# Patient Record
Sex: Male | Born: 1935
Health system: Southern US, Community
[De-identification: ages and names within clinical notes are randomized; demographics above are authoritative.]

## PROBLEM LIST (undated history)

## (undated) DIAGNOSIS — I469 Cardiac arrest, cause unspecified: Secondary | ICD-10-CM

## (undated) DIAGNOSIS — E785 Hyperlipidemia, unspecified: Secondary | ICD-10-CM

## (undated) DIAGNOSIS — I10 Essential (primary) hypertension: Secondary | ICD-10-CM

## (undated) DIAGNOSIS — I2510 Atherosclerotic heart disease of native coronary artery without angina pectoris: Secondary | ICD-10-CM

## (undated) DIAGNOSIS — I472 Ventricular tachycardia: Secondary | ICD-10-CM

## (undated) HISTORY — DX: Ventricular tachycardia: I47.2

## (undated) HISTORY — PX: TONSILLECTOMY AND ADENOIDECTOMY: SUR1326

---

## 1998-02-25 ENCOUNTER — Inpatient Hospital Stay (HOSPITAL_COMMUNITY): Admission: EM | Admit: 1998-02-25 | Discharge: 1998-02-28 | Payer: Self-pay | Admitting: *Deleted

## 1998-02-27 ENCOUNTER — Encounter: Payer: Self-pay | Admitting: Cardiovascular Disease

## 2000-10-03 ENCOUNTER — Encounter (INDEPENDENT_AMBULATORY_CARE_PROVIDER_SITE_OTHER): Payer: Self-pay | Admitting: *Deleted

## 2000-10-03 ENCOUNTER — Ambulatory Visit (HOSPITAL_COMMUNITY): Admission: RE | Admit: 2000-10-03 | Discharge: 2000-10-03 | Payer: Self-pay | Admitting: *Deleted

## 2001-03-12 DIAGNOSIS — I469 Cardiac arrest, cause unspecified: Secondary | ICD-10-CM

## 2001-03-12 HISTORY — DX: Cardiac arrest, cause unspecified: I46.9

## 2001-12-15 ENCOUNTER — Inpatient Hospital Stay (HOSPITAL_COMMUNITY): Admission: EM | Admit: 2001-12-15 | Discharge: 2001-12-20 | Payer: Self-pay | Admitting: Emergency Medicine

## 2001-12-15 ENCOUNTER — Encounter: Payer: Self-pay | Admitting: Emergency Medicine

## 2001-12-15 HISTORY — PX: CARDIAC CATHETERIZATION: SHX172

## 2001-12-16 ENCOUNTER — Encounter: Payer: Self-pay | Admitting: Thoracic Surgery (Cardiothoracic Vascular Surgery)

## 2001-12-17 ENCOUNTER — Encounter: Payer: Self-pay | Admitting: Thoracic Surgery (Cardiothoracic Vascular Surgery)

## 2001-12-18 ENCOUNTER — Encounter: Payer: Self-pay | Admitting: Thoracic Surgery (Cardiothoracic Vascular Surgery)

## 2002-01-12 ENCOUNTER — Encounter (HOSPITAL_COMMUNITY): Admission: RE | Admit: 2002-01-12 | Discharge: 2002-03-31 | Payer: Self-pay | Admitting: Cardiology

## 2002-01-14 ENCOUNTER — Encounter
Admission: RE | Admit: 2002-01-14 | Discharge: 2002-01-14 | Payer: Self-pay | Admitting: Thoracic Surgery (Cardiothoracic Vascular Surgery)

## 2002-01-14 ENCOUNTER — Encounter: Payer: Self-pay | Admitting: Thoracic Surgery (Cardiothoracic Vascular Surgery)

## 2002-03-12 HISTORY — PX: CORONARY ARTERY BYPASS GRAFT: SHX141

## 2003-07-26 ENCOUNTER — Ambulatory Visit (HOSPITAL_COMMUNITY): Admission: RE | Admit: 2003-07-26 | Discharge: 2003-07-26 | Payer: Self-pay | Admitting: Cardiology

## 2005-09-26 ENCOUNTER — Ambulatory Visit (HOSPITAL_COMMUNITY): Admission: RE | Admit: 2005-09-26 | Discharge: 2005-09-26 | Payer: Self-pay | Admitting: *Deleted

## 2005-09-26 ENCOUNTER — Encounter (INDEPENDENT_AMBULATORY_CARE_PROVIDER_SITE_OTHER): Payer: Self-pay | Admitting: *Deleted

## 2005-11-18 ENCOUNTER — Emergency Department (HOSPITAL_COMMUNITY): Admission: EM | Admit: 2005-11-18 | Discharge: 2005-11-18 | Payer: Self-pay | Admitting: Emergency Medicine

## 2006-01-23 ENCOUNTER — Inpatient Hospital Stay (HOSPITAL_COMMUNITY): Admission: EM | Admit: 2006-01-23 | Discharge: 2006-01-24 | Payer: Self-pay | Admitting: Emergency Medicine

## 2007-10-28 ENCOUNTER — Ambulatory Visit (HOSPITAL_COMMUNITY): Admission: RE | Admit: 2007-10-28 | Discharge: 2007-10-28 | Payer: Self-pay | Admitting: *Deleted

## 2007-10-28 ENCOUNTER — Encounter (INDEPENDENT_AMBULATORY_CARE_PROVIDER_SITE_OTHER): Payer: Self-pay | Admitting: *Deleted

## 2009-07-07 ENCOUNTER — Inpatient Hospital Stay (HOSPITAL_COMMUNITY): Admission: EM | Admit: 2009-07-07 | Discharge: 2009-07-08 | Payer: Self-pay | Admitting: Emergency Medicine

## 2009-07-08 ENCOUNTER — Encounter (INDEPENDENT_AMBULATORY_CARE_PROVIDER_SITE_OTHER): Payer: Self-pay | Admitting: Internal Medicine

## 2009-12-09 HISTORY — PX: NM MYOCAR PERF WALL MOTION: HXRAD629

## 2010-05-30 LAB — BASIC METABOLIC PANEL
BUN: 16 mg/dL (ref 6–23)
CO2: 27 mEq/L (ref 19–32)
CO2: 28 mEq/L (ref 19–32)
Calcium: 8.3 mg/dL — ABNORMAL LOW (ref 8.4–10.5)
Calcium: 8.6 mg/dL (ref 8.4–10.5)
Chloride: 103 mEq/L (ref 96–112)
Chloride: 106 mEq/L (ref 96–112)
Creatinine, Ser: 0.79 mg/dL (ref 0.4–1.5)
Creatinine, Ser: 0.8 mg/dL (ref 0.4–1.5)
GFR calc non Af Amer: >60 mL/min (ref >60)
Glucose, Bld: 92 mg/dL (ref 70–99)
Potassium: 3.6 mEq/L (ref 3.5–5.1)
Potassium: 4.3 mEq/L (ref 3.5–5.1)
Sodium: 138 mEq/L (ref 135–145)

## 2010-05-30 LAB — POCT CARDIAC MARKERS
CKMB, poc: 1.5 ng/mL (ref 1.0–8.0)
Myoglobin, poc: 53.3 ng/mL (ref 12–200)
Myoglobin, poc: 69 ng/mL (ref 12–200)
Troponin i, poc: <0.05 ng/mL (ref 0.00–0.09)
Troponin i, poc: <0.05 ng/mL (ref 0.00–0.09)

## 2010-05-30 LAB — CARDIAC PANEL(CRET KIN+CKTOT+MB+TROPI)
Relative Index: 2.2 (ref 0.0–2.5)
Total CK: 110 U/L (ref 7–232)
Troponin I: 0.01 ng/mL (ref 0.00–0.06)
Troponin I: <0.01 ng/mL (ref 0.00–0.06)

## 2010-05-30 LAB — BRAIN NATRIURETIC PEPTIDE: Pro B Natriuretic peptide (BNP): 32 pg/mL (ref 0.0–100.0)

## 2010-05-30 LAB — DIFFERENTIAL
Eosinophils Relative: 2 % (ref 0–5)
Neutrophils Relative %: 56 % (ref 43–77)

## 2010-05-30 LAB — HEMOGLOBIN A1C
Hgb A1c MFr Bld: 5.8 % — ABNORMAL HIGH (ref <5.7)
Mean Plasma Glucose: 120 mg/dL — ABNORMAL HIGH (ref <117)

## 2010-05-30 LAB — CBC
Hemoglobin: 14.6 g/dL (ref 13.0–17.0)
MCHC: 34.8 g/dL (ref 30.0–36.0)
MCV: 93.3 fL (ref 78.0–100.0)
Platelets: 184 10*3/uL (ref 150–400)
Platelets: 190 10*3/uL (ref 150–400)
RBC: 4.5 MIL/uL (ref 4.22–5.81)
RDW: 13.9 % (ref 11.5–15.5)
RDW: 14 % (ref 11.5–15.5)

## 2010-05-30 LAB — LIPID PANEL
Cholesterol: 112 mg/dL (ref 0–200)
HDL: 38 mg/dL — ABNORMAL LOW (ref >39)
LDL Cholesterol: 55 mg/dL (ref 0–99)
Total CHOL/HDL Ratio: 2.9 RATIO

## 2010-05-30 LAB — GLUCOSE, CAPILLARY
Glucose-Capillary: 112 mg/dL — ABNORMAL HIGH (ref 70–99)
Glucose-Capillary: 83 mg/dL (ref 70–99)

## 2010-07-25 NOTE — Op Note (Signed)
NAME:  Michael Bryant, Michael Bryant                 ACCOUNT NO.:  [id number]   MEDICAL RECORD NO.:  [id number]          PATIENT TYPE:  AMB   LOCATION:  ENDO                         FACILITY:  [facility]   PHYSICIAN:  Georgiana Spinner, M.D.    DATE OF BIRTH:  1933/06/12   DATE OF PROCEDURE:  DATE OF DISCHARGE:                               OPERATIVE REPORT   PROCEDURE:  Upper endoscopy.   INDICATIONS:  GERD.   ANESTHESIA:  Fentanyl 50 mcg, Versed 5 mg.   DESCRIPTION OF PROCEDURE:  With the patient mildly sedated in the left  lateral decubitus position, the Pentax videoscopic endoscope was  inserted in the mouth and passed under direct vision through the  esophagus which appeared normal until we reached the distal esophagus  and there was a small area of Barrett's photographed and biopsied.  We  entered into the stomach.  The fundus, body, antrum, duodenal bulb and  second portion duodenum were visualized.  From this point, the endoscope  was slowly withdrawn, taking circumferential views of duodenal mucosa  until the endoscope had been pulled back into the stomach and placed in  retroflexion to view the stomach from below.  The endoscope was then  straightened and withdrawn, taking circumferential views of the  remaining gastric and esophageal mucosa, photographed, but possible  Barrett's which was biopsied, as well as photographed.  The endoscope  was withdrawn.  The patient's vital signs and pulse oximeter remained  stable.  The patient tolerated the procedure well without apparent  complication.   FINDINGS:  Two areas of possible Barrett's esophagus.  Await biopsy  report.  The patient will call me for results and follow-up with me as  an outpatient.           ______________________________  Georgiana Spinner, M.D.     GMO/MEDQ  D:  10/28/2007  T:  10/28/2007  Job:  [id number]

## 2010-07-28 NOTE — Discharge Summary (Signed)
NAME:  Michael Bryant, Michael Bryant                           ACCOUNT NO.:  [id number]   MEDICAL RECORD NO.:  [id number]                   PATIENT TYPE:  INP   LOCATION:  2029                                 FACILITY:  [facility]   PHYSICIAN:  Salvatore Decent. Dorris Fetch, M.Bryant.         DATE OF BIRTH:  Oct 06, 1933   DATE OF ADMISSION:  12/15/2001  DATE OF DISCHARGE:  12/20/2001                                 DISCHARGE SUMMARY   ADMISSION DIAGNOSIS:  Chest pain.   SECONDARY DIAGNOSES:  1. Ventricular fibrillation, cardiac arrest.  2. Postoperative anemia secondary to blood loss.  3. Hypercholesterolemia.   DISCHARGE DIAGNOSIS:  Coronary artery disease.   HOSPITAL COURSE:  The patient was admitted to [facility] on  12/15/01, after experiencing sudden onset of substernal chest pain.  He was  seen and evaluated by cardiologist who took the patient for an emergent  cardiac catheterization.  During this time, the patient had a ventricular  fibrillation causing cardiac arrest x2.  The patient underwent cardiac  catheterization and was stabilized.  Dr. _______________ was consulted  emergently and subsequently took the patient to the operating room on the  night of admission for a coronary artery bypass grafting x3 with left  internal mammary artery anastomosed to the left anterior descending artery,  saphenous vein graft to the obtuse marginal artery, and a saphenous vein  graft to the distal right coronary artery.  No complications were noted  during the intraoperative course.  The patient had an uneventful hospital  course.  He had some mild postoperative anemia secondary to blood loss which  he tolerated well.  No transfusion was required.  His hemoglobin and  hematocrit stabilized at 9.5 and 28.1.  His BUN and creatinine were 18 and  0.9.  Potassium was 4.0.  The patient was subsequently deemed stable for  discharge home on 12/20/01, which is postoperative day #5.   DISCHARGE MEDICATIONS:  1.  Aspirin 325 mg one q.Bryant.  2. _______________80 mg q.Bryant.  3. Lopressor 50 mg 1/2 tablet b.i.Bryant.  4. Mavik 1 mg q.Bryant.  5. Tylox one or two tablets p.o. q.4-6h. p.r.n. pain.   ACTIVITY:  The patient is told no driving, strenuous activity, or lifting  heavy objects.  He is told to walk daily and to continue using his incentive  spirometer.   DIET:  Low fat, low salt.   WOUND CARE:  The patient was told he can shower and clean his incisions with  soap and water.   DISPOSITION:  Home.    FOLLOWUP:  1. The patient was told to see Dr. Dorris Fetch at the [facility] office on     Wednesday, 01/14/02, at 10 a.m.  He was told to go to the [facility] one hour before this appointment to get a chest x-ray.  2. He was told to call his cardiologist, Dr. Julieanne Manson for a two week  follow up appointment.         Levin Erp. Steward, P.A.                      Salvatore Decent Dorris Fetch, M.Bryant.    BGS/MEDQ  Bryant:  12/19/2001  T:  12/22/2001  Job:  [id number]   cc:   Thereasa Solo. Little, M.Bryant.   [facility] office

## 2010-07-28 NOTE — Cardiovascular Report (Signed)
NAME:  Michael Bryant, Michael Bryant, Michael Bryant                       ACCOUNT NO.:  [id number]   MEDICAL RECORD NO.:  [id number]                   PATIENT TYPE:  INP   LOCATION:  2029                                 FACILITY:  [facility]   PHYSICIAN:  Thereasa Solo. Little, M.Bryant.              DATE OF BIRTH:  1933-11-26   DATE OF PROCEDURE:  12/15/2001  DATE OF DISCHARGE:                              CARDIAC CATHETERIZATION   INDICATIONS FOR PROCEDURE:  The patient is a 77 year old male who developed  chest pain at 6 o'clock in the evening, who presented to the emergency room  around 7 o'clock and at 7:15 fibrillated and was defibrillated.  His ECG  showed diffuse ST segment depression in the inferolateral leads and he was  brought to the catheterization lab for emergency cardiac catheterization.   DESCRIPTION OF PROCEDURE:  Following local anesthetic with 1% Xylocaine, the  Seldinger technique was employed and a 7 [place] introducer sheath was placed  in his right femoral artery. Left and right coronary arteriography and  ventriculography in the RAO projection was performed.   COMPLICATIONS:  None.   EQUIPMENT:  The 6 French Judkins configuration catheters.   RESULTS:  1. Hemodynamic monitoring:  Central aortic pressure 119/90, left ventricular     pressure 124/17 with no aortic valve gradient noted at time of pullback.  2. Ventriculography:  Ventriculography in the RAO projection revealed the     anterior wall, the apex and the distal inferior wall to be akinetic with     an ejection fraction around 45%.  No mitral regurgitation was seen.  The     end-diastolic pressure was 29 (it appears that the anterior wall is     stunned).   CORONARY ARTERIOGRAPHY:  1. Left main:  Normal.  2. LAD:  The very proximal portion of the LAD has a 60% plus eccentric area     of narrowing with an active mobile thrombosis. There is 50% sequential     areas in the midportion and the distal vessel appears to be free of     disease  and graftable. The proximal lesion is too close to the left main     for safe percutaneous intervention.  3. Optimal diagonal:  This is a small vessel with 50-60% proximal mid     narrowing.  4. Circumflex:  The circumflex gave rise to one OM vessel with a mid 60%     area of narrowing.  5. Right coronary artery:  The right coronary artery is a large dominant     vessel with the PDA and three PL branches.  There is a proximal 40% area     of narrowing.   CONCLUSION:  Proximal 60% plus stenosis in the left anterior descending with  active thrombosis too close to the left main for intervention.   DISPOSITION:  The patient is on IV heparin.  Dr. Dorris Fetch has seen the  patient and plans to take him for emergency bypass surgery.                                                 Thereasa Solo. Little, M.Bryant.    ABL/MEDQ  Bryant:  12/15/2001  T:  12/18/2001  Job:  [id number]   cc:   Salvatore Decent. Dorris Fetch, M.Bryant.  [address]  [place]  [name] [address]  Fax: [phone]   Aram Candela. Aleen Campi, M.Bryant.  [address]  [place]  [name] [address]  Fax: [phone]   Janae Bridgeman. Eloise Harman., M.Bryant.  [address]  [place]  [name] [address]  Fax: [phone]   Cardiac Catheterization Laboratory

## 2010-07-28 NOTE — H&P (Signed)
NAME:  Michael Bryant, Michael Bryant                 ACCOUNT NO.:  [id number]   MEDICAL RECORD NO.:  [id number]          PATIENT TYPE:  INP   LOCATION:  1824                         FACILITY:  [facility]   PHYSICIAN:  Lonia Blood, M.D.DATE OF BIRTH:  Aug 05, 1933   DATE OF ADMISSION:  01/23/2006  DATE OF DISCHARGE:                              HISTORY & PHYSICAL   PRIMARY CARE PHYSICIAN:  Dr. Higinio Plan.   CARDIOLOGIST:  Dr. Antionette Char, MD   CHIEF COMPLAINT:  Chest pain with diaphoresis.   HISTORY OF PRESENT ILLNESS:  [name] is a very pleasant 77-year-  old gentleman with a known history of coronary artery disease, who is  status post coronary artery bypass graft of 3 vessels in October 2003.  He also has known hypercholesterolemia.  He is a nonsmoker and does not  have diabetes.  He has well-controlled hypertension.  Patient was in his  usual state of health this morning.  He went to a local golf course to  enjoy a round of golf.  At hole 17, he suffered the acute onset of  substernal pressure-type sensation.  This sensation did not radiate.  It  occurred while he was walking to the green.  It was associated with a  particularly bad shot.  Symptoms persisted.  A few minutes after onset,  it began to be associated with diaphoresis and a feeling of  lightheadedness.  The patient continued his round of golf and in fact  parred the final hole.  He then promptly ambulated to his car, where he  took a sublingual nitroglycerin.  The sensation of pressure in his chest  promptly disappeared after the use of the sublingual nitroglycerin.  Even after the disappearance of this pressure-type sensation, however,  the patient reported that he continued to feel just not quite right.  He went home and described the symptoms to his wife, and they both  decided that presentation at the ER would be most appropriate.  In the  emergency room, two sets of cardiac enzymes have returned, and they have  been negative so far.  His EKG does not reveal any significant acute  change when compared to an EKG from September of this year.  The patient  no longer has a chest discomfort but continues to complain that he just  does not quite feel right in the chest.   REVIEW OF SYSTEMS:  With the exception to the positive elements noted in  the history of present illness above, a comprehensive review of systems  is unremarkable.   PAST MEDICAL HISTORY:  1. Known coronary artery disease.      a.     Status post acute coronary syndrome with v-fib arrest       requiring defibrillation October 2003.      b.     Status post emergent cardiac catheterization October 2003       revealing high-grade proximal LAD thrombus.      c.     Resultant emergent coronary artery bypass graft October       2003, requiring 3-vessel  bypass.      d.     Patient reports two stress tests since that time but no       cardiac cath.  2. Hypercholesterolemia.  3. EGD July 2002 and July 2007 revealing chronic gastritis and      Barrett's esophagus with normal biopsies and negative H. Pylori.  4. Colonoscopy July 2002 revealing internal hemorrhoids and rare      diverticulum of the sigmoid colon.  5. Benign prostatic hypertrophy.'   OUTPATIENT MEDICATIONS:  1. Mavik 1 mg q. day.  2. Lopressor 25 mg b.i.d.  3. Aspirin 325 mg q. day.   ALLERGIES:  CODEINE.   FAMILY HISTORY:  The patient has two brothers who have been diagnosed  with prostate cancer.   SOCIAL HISTORY:  The patient is retired from concrete work.  He does not  smoke, he does not drink, he does not use illicit drugs.  He is married.  He has two healthy children.   DATA REVIEWED:  CBC is normal.  BMET is unremarkable.  LFTs are  unremarkable, pH and PCO2 were unremarkable.  Point of care markers are  negative x2.  A 12-lead EKG revealed a sinus bradycardia with 55 beats  per minute but no change since September 2007.   PHYSICAL EXAMINATION:  VITAL  SIGNS:  Temperature 97.6, blood pressure  146/78, heart rate 53, respiratory rate 18, O2 sat is 95% on room air.  GENERAL:  Well-developed, well-nourished male in no acute respiratory  distress.  HEENT:  Normocephalic, atraumatic.  Pupils equal, round, reactive to  light and accommodation.  Extraocular muscles intact.  __________  clear.  NECK:  No JVD, no lymphadenopathy.  LUNGS:  Clear to auscultation bilaterally without wheezes or rhonchi.  CARDIOVASCULAR:  Regular rate and rhythm without murmur, gallop or rub.  Normal S1, S2.  ABDOMEN:  Nontender, nondistended, soft, bowel sounds present with no  hepatosplenomegaly.  No rebound or ascites.  EXTREMITIES:  There is no significant cyanosis, clubbing, edema  bilateral lower extremities.  NEUROLOGIC:  Nonfocal neurologic exam.   IMPRESSION/PLAN:  1. Chest pain - Patient described symptoms which are concerning for      angina.  He also is at exceedingly high risk for GI source for      chest discomfort, to include his gastritis and his Barrett's      esophagus.  In the setting of known coronary artery disease      requiring previous coronary artery bypass graft, however, it is not      safe to assume that this is a GI issue.  I will place the patient      in the hospital and rule out for acute myocardial infarction.      Serial EKGs and cardiac enzymes will attained.  Patient will be      monitored on telemetry.  I will proceed with treatment to include      nitroglycerin, Lovenox, aspirin, beta blocker and Ace inhibitor.      Oxygen will also be administered.  In the morning, we will contact      his cardiologist, to consider risk stratification.  It would appear      that the patient would be most appropriate for a cardiac cath.  2. Hypercholesterolemia - Patient reports he has a history of      hypercholesterolemia but does not appear to be any lipid-lowering     medications.  I will check a lipid panel, and we will proceed  as       appropriate.  3. Known gastritis and Barrett's esophagus - The patient, we placed      empirically on Protonix at 40 mg p.o. b.i.d. and Mylanta.  If his      cardiac evaluation proves to be unremarkable, we may need to      consider continuing his medications for a GI source of his chest      pain.      Lonia Blood, M.D.  Electronically Signed     JTM/MEDQ  D:  01/23/2006  T:  01/23/2006  Job:  [id number]   cc:   Antionette Char, MD  Janae Bridgeman Eloise Harman., M.D.

## 2010-07-28 NOTE — Procedures (Signed)
[place]  Patient:    Michael Bryant, Michael Bryant                          MRN: [id number] Proc. Date: 10/03/00 Attending:  Sabino Gasser, M.D.                           Procedure Report  PROCEDURE PERFORMED:  Upper endoscopy.  ENDOSCOPIST:  Sabino Gasser, M.D.  INDICATIONS FOR PROCEDURE:  Blood in stool.  ANESTHESIA:  Demerol 30 mg, Versed 6 mg.  DESCRIPTION OF PROCEDURE:  With the patient mildly sedated in the left lateral decubitus position, the Olympus video endoscope was inserted in the mouth and passed under direct vision through the esophagus.  Question of short segment Barretts esophagus photographed.  We biopsied this area, entered into the stomach.  The fundus, body and antrum were well visualized as was the duodenal bulb and second portion of the duodenum.  Photographs were taken.  From this point, the endoscope was slowly withdrawn taking circumferential views of the entire duodenal mucosa until the endoscope had been pulled back into the stomach and placed on retroflexion to view the stomach from below and this to was photographed and showed a hiatal hernia as evidenced by incomplete wrap of the gastroesophageal junction around the endoscope.  The endoscope was then straightened and withdrawn taking circumferential views of the remaining gastric and esophageal mucosa, stopping in the stomach to biopsy gastric erythema.  Patients vital signs and pulse oximeter remained stable.  The patient tolerated the procedure well without apparent complications.  FINDINGS:  Question of short segment Barretts esophagus, biopsied.  Question of gastritis in the fundus biopsied.  Await biopsy report.  Patient will call me for results and follow up with me as an outpatient.  Proceed to colonoscopy as planned.  PLAN: DD:  10/03/00 TD:  10/03/00 Job: [id number]

## 2010-07-28 NOTE — Discharge Summary (Signed)
NAME:  Michael Bryant, Michael Bryant                 ACCOUNT NO.:  [id number]   MEDICAL RECORD NO.:  [id number]          PATIENT TYPE:  INP   LOCATION:  5524                         FACILITY:  [facility]   PHYSICIAN:  Mobolaji B. Bakare, M.D.DATE OF BIRTH:  [birth year]   DATE OF ADMISSION:  01/23/2006  DATE OF DISCHARGE:  01/24/2006                                 DISCHARGE SUMMARY   PRIMARY CARE PHYSICIAN:  Marcy Salvo C. Lendell Caprice, M.D.   CARDIOLOGIST:  Antionette Char, MD   FINAL DIAGNOSES:  1. Chest pain, myocardial infarction ruled out.  2. Known history of coronary artery disease.  3. Hypercholesterolemia.  4. Benign prostatic hypertrophy.   PROCEDURE:  Chest x-ray showed no acute disease.   CONSULTATIONS:  Cardiology consult, Antionette Char, MD.   BRIEF HISTORY:  [name] is a pleasant 77 year old gentleman who  presented with chest pain and diaphoresis while playing golf on the day of  admission.  He also felt lightheaded. The patient got some relief from the  sublingual nitroglycerin that he took before he came to the emergency  department.  EKG was unremarkable for any acute ischemic changes.  He was  then admitted for further evaluation and to rule out myocardial infarction.   HOSPITAL COURSE:  His chest pain resolved while in the hospital.  The  patient was admitted to telemetry.  There was no malignant reading.  He had  three sets of cardiac markers which were negative and subsequently two sets  of cardiac panels which were also negative.  This were the cardiac markers  at the emergency room.  The patient has since been chest pain free.  He was  evaluated by Dr. Aleen Campi, and he felt that this was atypical chest pain and  that there was no evidence of ischemia; nevertheless, the patient will be  scheduled for an outpatient stress test early next week.   Hyperlipidemia.  The patient's fasting lipid profile was assessed and he was  noted to have an LDL of 122 and HDL of 37.  The  patient is currently on fish  oil.  I will defer followup of this to Dr. Lendell Caprice.   DISCHARGE MEDICATIONS:  1. Lopressor 25 mg p.o. daily.  2. Voltaren 1 tablet p.o. daily __________  3. Nasal Rhinocort.  4. Fish oil 1 tablet p.o. daily.  5. Flax oil 1 tablet daily.  6. Aspirin 81 mg daily.  7. Red yeast rice.  8. Nitrostat p.r.n.  9. Saw palmetto 1 p.o. daily.   DISCHARGE LABORATORY DATA:  White count 5.0, hemoglobin 13.4, hematocrit  38.6, platelets 208, magnesium 2.1.  Liver function test normal.  Fasting  lipid profile:  Total cholesterol 147, triglyceride 90, HDL 37, LDL 122.   RECOMMENDATIONS:  Followup with stress test with Dr. Aleen Campi early next  week.      Mobolaji B. Corky Downs, M.D.  Electronically Signed     MBB/MEDQ  D:  01/24/2006  T:  01/25/2006  Job:  [id number]   cc:   Antionette Char, MD  Janae Bridgeman Eloise Harman., M.D.

## 2010-07-28 NOTE — Op Note (Signed)
[place]  Patient:    Michael Bryant, Michael Bryant                          MRN: [id number] Proc. Date: 10/03/00 Attending:  Sabino Gasser, M.D.                           Operative Report  PROCEDURE PERFORMED:  Colonoscopy.  ENDOSCOPIST:  Sabino Gasser, M.D.  INDICATIONS FOR PROCEDURE:  Hemoccult positivity.  ANESTHESIA:  Demerol 20 mg, Versed 2 mg.  DESCRIPTION OF PROCEDURE:  With the patient mildly sedated in the left lateral decubitus position, the Olympus videoscopic colonoscope was inserted in the rectum after normal rectal examination and passed under direct vision into the cecum.  The cecum was identified by the ileocecal valve and appendiceal orifice, both of which were photographed.  From this point, the colonoscope was slowly withdrawn, and we entered into the terminal ileum, which also appeared normal and this was photographed.  The endoscope was then further withdrawn taking circumferential views of the remaining colonic mucosa stopping only to photograph rare diverticulum seen in the sigmoid colon until we reached the rectum, which appeared normal on direct view and showed hemorrhoidal tissue on retroflex view.  The endoscope was straightened and withdrawn.  Patients vital signs and pulse oximeter remained stable.  The patient tolerated the procedure well and without apparent complications.  FINDINGS:  Internal hemorrhoids, rare diverticulum of sigmoid colon, otherwise unremarkable examination.  PLAN:  See endoscopy note for further details. DD:  10/03/00 TD:  10/03/00 Job: [id number]

## 2010-07-28 NOTE — Op Note (Signed)
NAME:  Los, Raymondo D                           ACCOUNT NO.:  [id number]   MEDICAL RECORD NO.:  [id number]                   PATIENT TYPE:  INP   LOCATION:  1824                                 FACILITY:  [facility]   PHYSICIAN:  Salvatore Decent. Dorris Fetch, M.D.         DATE OF BIRTH:  August 18, 1933   DATE OF PROCEDURE:  12/16/2001  DATE OF DISCHARGE:                                 OPERATIVE REPORT   PREOPERATIVE DIAGNOSIS:  Three vessels disease with intraluminal thrombus,  status post ventricular fibrillation arrest.   POSTOPERATIVE DIAGNOSIS:  Three vessels disease with intraluminal thrombus,  status post ventricular fibrillation arrest.   PROCEDURES:  Emergency median sternotomy, extracorporeal circulation,  coronary artery bypass grafting x 3 (left internal mammary artery to left  anterior descending, saphenous vein graft to obtuse marginal one, saphenous  vein graft to distal right coronary artery).   SURGEON:  Salvatore Decent. Dorris Fetch, M.D.   ASSISTANT:  Levin Erp. Steward, P.A.   ANESTHESIA:  General.   FINDINGS:  Good quality conduits, good quality targets.  A segment of vein  from the right lower leg not usable, therefore additional vein harvested  from the left lower leg.  The patient was weaned from bypass without  difficulty.   CLINICAL NOTE:  The patient is a 77 year old gentleman with a prior history  of MI secondary to LAD disease.  He was treated medically at that time,  which was six years previously.  On December 16, 2001, he developed severe  substernal chest pain.  He came to the emergency room, where he had a  witnessed V fib arrest.  He was defibrillated and taken emergently to the  cardiac catheterization laboratory.  In the catheterization  laboratory, he  had a proximal 60-70% LAD lesion with intraluminal thrombus.  He also had  approximately 60% lesions in both the circumflex and right coronary.  The  LAD lesion was not favorable for percutaneous intervention due to  its close  proximity to the left main, therefore, the patient was referred for urgent  coronary artery bypass grafting.  The patient was alert and oriented.  The  indications, risks, benefits, and alternatives for treatment were discussed  with the patient.  He understood and accepted the risks of emergent coronary  artery bypass grafting, including, but not limited to death, stroke, MI,  DVT, PE, bleeding, possible need for transfusions, infection, as well as  other organ system dysfunction.  He accepted these risks and agreed to  proceed.  The OR was notified and the patient was taken to the operating  room emergently.  He was hemodynamically stable with no evidence of active  ischemia at the time of transport to the operating room.   OPERATIVE NOTE:  The patient was taken directly to the operating room, where  monitoring lines were placed to monitor arterial, central venous, and  pulmonary arterial pressures under the direction of Dr. Krista Blue of  Anesthesia.  Intravenous antibiotics were administered.  The patient was  anesthetized and intubated.  A Foley catheter was placed, intravenous  antibiotics were administered, and the chest, abdomen, and legs were prepped  and draped in usual fashion.   A median sternotomy was performed and the left internal mammary artery was  harvested in the standard fashion.  Simultaneously, an incision was made in  the medial aspect of the right leg.  The greater saphenous vein was  harvested from the lower portion of the right leg.  Approximately half way  up the calf, the vein became too small to use.  There was not adequate  length of good quality vein for grafting both the right and the circumflex,  so additional vein was harvested from the left lower leg.  The vein grafts,  which were used, were of good quality.  The poor quality vein was discarded.  The patient was  fully heparinized prior to dividing the distal end of the mammary artery.  There  was excellent flow through the cut end of the vessel and it was an  excellent quality graft.   The pericardium was opened.  The ascending aorta was inspected and palpated.  There was no palpable atherosclerotic disease.  The aorta was cannulated via  concentric 2-0 Ethibond nonpledgeted purse-string sutures.  A dual-stage  venous cannula was placed via a purse-string suture in the right atrial  appendage.  Cardiopulmonary bypass was instituted and the patient was cooled  to 32 degrees Celcius.  The coronary arteries were inspected and anastomotic  sites were chosen.  The conduits were inspected and cut to length.  A foam  pad was placed in the pericardium to protect the left phrenic nerve.  A  temperature probe was placed in the myocardial septum and a cardioplegia  cannula was placed in the ascending aorta.   The aorta was cross-clamped, the left ventricle was emptied via the aortic  root vent, and cardiac arrest then was achieved with a combination of cold  antegrade blood cardioplegia and topical ice saline.  Of note, the patient  had developed some ST elevations just prior to cross-clamping.  This was  less than 5 minutes in duration.  After achieving a complete diastolic  arrest and a myocardial septal temperature of 11 degrees Celcius, the  following distal anastomoses were performed.   First, a reverse saphenous vein graft was placed end-to-side to the distal  right coronary.  This was extended on to the takeoff of the posterior  descending.  This was a 1.5 mm vessel.  The right coronary was 2.5 mm  vessel.  The vein graft was of good quality.  The anastomosis was performed  with a running 7-0 Prolene suture.  There was excellent flow through the  graft.  Cardioplegia was administered and there was good hemostasis at the  anastomosis.   Next, a reversed saphenous vein graft was placed end-to-side to the first obtuse marginal branch of the left circumflex coronary artery.  This  was a  large dominant posterolateral branch and there was approximately 60%  stenosis in the circumflex proximal to this branch.  The vessel itself was  of good quality and 2 mm in diameter.  The anastomosis was performed end-to-  side with a running 7-0 Prolene suture.  Again, there was excellent flow  through this graft.  Cardioplegia was administered and there was a small  leak at the toe, which was repaired with a 7-0 Prolene suture.  The left internal mammary artery was brought through a window in the  pericardium.  The distal end was spatulated.  It was then anastomosed end-to-  side to the distal LAD.  The distal LAD was a 2 mm good quality target.  The  mammary was a 2.5 mm good quality conduit.  The anastomosis was performed  with a running 8-0 Prolene suture in an end-to-side fashion.  At the  completion of the mammary to LAD anastomosis, the bulldog clamp was removed  to inspect for hemostasis.  Immediate and rapid septal rewarming was noted.  The bulldog clamp was replaced.  Additional cardioplegia was administered  down the vein grafts.   The vein grafts were cut to length, the cardioplegia cannula was removed  from the ascending aorta, the proximal anastomoses were performed under  cross-clamp to 4.4 mm punch aortotomies with running 6-0 Prolene sutures.  At the completion of the final proximal anastomosis, the bulldog clamp was  once again removed from the mammary artery, lidocaine was administered, and  immediate and rapid septal rewarming was once again noted.  The aortic root  was deaired.  The patient was placed in Trendelenburg position and the  aortic cross-clamp was removed.  The total cross-clamp time was 55 minutes.   The patient did not require defibrillation and had a spontaneous rhythm,  which was bradycardic initially.  All proximal and distal anastomoses were  inspected for hemostasis.  The patient was rewarmed.  Epicardial pacing  wires were placed on the  right ventricle and right atrium.  When the core  temperature had reached 37 degrees Celcius, a dopamine drip was initiated.  The patient at this point was tachycardic.  He weaned from cardiopulmonary  bypass without difficulty.  Total bypass time was 100 minutes.  Initial  cardiac index was greater than 2 liters/min/m.sq. and the patient remained  hemodynamically stable throughout the postbypass period.  His tachycardia  was treated with  esmolol.  He then had sinus rhythm in the 75 to 80 range  and maintained a cardiac index of greater than 2 liters/min/m.sq.   A test dose of protamine was administered and was well tolerated.  The  atrial and aortic cannulae were removed.  There was good hemostasis at both  cannulation sites.  The remainder of the protamine was administered without  incident.  The chest was irrigated with 1 liter of warm, normal saline containing 1 g of vancomycin.  Hemostasis was achieved.  The pericardium was  reapproximated with interrupted 3-0 silk sutures.  It came together easily  without tension.  A left pleural and two mediastinal chest tubes were  placed.  The sternum was closed with heavy gauge stainless-steel wires.  The  pectoralis fascia, subcutaneous tissue, and skin were closed in a standard  fashion with subcuticular skin closures.  All sponge, instrument, and needle  counts were correct at the end of the procedure.  The patient was taken from  the operating room to the surgical intensive care unit intubated and in  critical, but stable condition.                                                Salvatore Decent Dorris Fetch, M.D.    SCH/MEDQ  D:  12/16/2001  T:  12/16/2001  Job:  [id number]   cc:   Aram Candela. Aleen Campi, M.D.  [address]  [place]  [name] [address]  Fax: [phone]   Thereasa Solo. Little, M.D.   Janae Bridgeman. Eloise Harman., M.D.  [address]  [place]  [name] [address]  Fax: [phone]

## 2010-07-28 NOTE — Op Note (Signed)
NAME:  Vizzini, Malaki                 ACCOUNT NO.:  [id number]   MEDICAL RECORD NO.:  [id number]          PATIENT TYPE:  AMB   LOCATION:  ENDO                         FACILITY:  [facility]   PHYSICIAN:  Georgiana Spinner, M.D.    DATE OF BIRTH:  Dec 25, 1933   DATE OF PROCEDURE:  DATE OF DISCHARGE:                                 OPERATIVE REPORT   PROCEDURE:  Upper endoscopy with biopsy.   INDICATIONS:  1.  GERD.  2.  Gastritis.   ANESTHESIA:  Fentanyl 50 mcg, Versed 4 mg.   PROCEDURE:  With the patient mildly sedated and in the left lateral  decubitus position, the Olympus video endoscope was inserted into the mouth  and passed under direct vision through the esophagus, which appeared normal  until we reached the distal esophagus, and above a hiatal hernia was a short  segment of Barrett's, photographed and biopsied.  We entered into the  stomach through the hiatal hernia sac.  Fundus, body, antrum, duodenal bulb,  and the second portion of the duodenum were visualized.  From this point,  the endoscope was slowly withdrawn, taking circumferential views of the  duodenal mucosa until the endoscope had been pulled back into the stomach  and placed in retroflexion to view the stomach from below.  The endoscope  was straightened and withdrawn, taking circumferential views of the gastric  and esophageal mucosa, stopping only in the body and fundus to biopsy  erythematous changes of gastritis.  The patient's vital signs and pulse  oximeter remained stable.  The patient tolerated the procedure well with no  apparent complications.   FINDINGS:  1.  Changes of gastritis, biopsied.  2.  A short segment of Barrett's esophagus, biopsied.  3.  Hiatal hernia, sliding.   PLAN:  1.  Await biopsy report; the patient will call me for results.  2.  Follow up with me as an outpatient.           ______________________________  Georgiana Spinner, M.D.     GMO/MEDQ  D:  09/26/2005  T:  09/26/2005  Job:   [id number]

## 2010-10-02 ENCOUNTER — Encounter: Payer: Self-pay | Admitting: Podiatry

## 2011-04-19 DIAGNOSIS — B351 Tinea unguium: Secondary | ICD-10-CM | POA: Diagnosis not present

## 2011-04-26 DIAGNOSIS — H00019 Hordeolum externum unspecified eye, unspecified eyelid: Secondary | ICD-10-CM | POA: Diagnosis not present

## 2011-05-02 DIAGNOSIS — H00019 Hordeolum externum unspecified eye, unspecified eyelid: Secondary | ICD-10-CM | POA: Diagnosis not present

## 2011-07-02 DIAGNOSIS — H6120 Impacted cerumen, unspecified ear: Secondary | ICD-10-CM | POA: Diagnosis not present

## 2011-07-05 DIAGNOSIS — I2510 Atherosclerotic heart disease of native coronary artery without angina pectoris: Secondary | ICD-10-CM | POA: Diagnosis not present

## 2011-07-05 DIAGNOSIS — I10 Essential (primary) hypertension: Secondary | ICD-10-CM | POA: Diagnosis not present

## 2011-07-05 DIAGNOSIS — Z951 Presence of aortocoronary bypass graft: Secondary | ICD-10-CM | POA: Diagnosis not present

## 2011-07-19 DIAGNOSIS — B351 Tinea unguium: Secondary | ICD-10-CM | POA: Diagnosis not present

## 2011-08-16 DIAGNOSIS — L821 Other seborrheic keratosis: Secondary | ICD-10-CM | POA: Diagnosis not present

## 2011-08-16 DIAGNOSIS — L570 Actinic keratosis: Secondary | ICD-10-CM | POA: Diagnosis not present

## 2011-09-17 DIAGNOSIS — H109 Unspecified conjunctivitis: Secondary | ICD-10-CM | POA: Diagnosis not present

## 2011-09-26 DIAGNOSIS — Z79899 Other long term (current) drug therapy: Secondary | ICD-10-CM | POA: Diagnosis not present

## 2011-09-26 DIAGNOSIS — R7309 Other abnormal glucose: Secondary | ICD-10-CM | POA: Diagnosis not present

## 2011-09-26 DIAGNOSIS — I10 Essential (primary) hypertension: Secondary | ICD-10-CM | POA: Diagnosis not present

## 2011-09-26 DIAGNOSIS — Z125 Encounter for screening for malignant neoplasm of prostate: Secondary | ICD-10-CM | POA: Diagnosis not present

## 2011-10-01 DIAGNOSIS — E7800 Pure hypercholesterolemia, unspecified: Secondary | ICD-10-CM | POA: Diagnosis not present

## 2011-10-01 DIAGNOSIS — N400 Enlarged prostate without lower urinary tract symptoms: Secondary | ICD-10-CM | POA: Diagnosis not present

## 2011-10-01 DIAGNOSIS — E119 Type 2 diabetes mellitus without complications: Secondary | ICD-10-CM | POA: Diagnosis not present

## 2011-10-04 DIAGNOSIS — H353190 Nonexudative age-related macular degeneration, unspecified eye, stage unspecified: Secondary | ICD-10-CM | POA: Diagnosis not present

## 2011-10-18 DIAGNOSIS — M79609 Pain in unspecified limb: Secondary | ICD-10-CM | POA: Diagnosis not present

## 2011-10-18 DIAGNOSIS — B351 Tinea unguium: Secondary | ICD-10-CM | POA: Diagnosis not present

## 2012-01-10 DIAGNOSIS — M79609 Pain in unspecified limb: Secondary | ICD-10-CM | POA: Diagnosis not present

## 2012-01-10 DIAGNOSIS — B351 Tinea unguium: Secondary | ICD-10-CM | POA: Diagnosis not present

## 2012-01-17 ENCOUNTER — Other Ambulatory Visit: Payer: Self-pay | Admitting: Dermatology

## 2012-01-17 DIAGNOSIS — L821 Other seborrheic keratosis: Secondary | ICD-10-CM | POA: Diagnosis not present

## 2012-01-17 DIAGNOSIS — Z85828 Personal history of other malignant neoplasm of skin: Secondary | ICD-10-CM | POA: Diagnosis not present

## 2012-01-17 DIAGNOSIS — D485 Neoplasm of uncertain behavior of skin: Secondary | ICD-10-CM | POA: Diagnosis not present

## 2012-01-17 DIAGNOSIS — L570 Actinic keratosis: Secondary | ICD-10-CM | POA: Diagnosis not present

## 2012-03-31 DIAGNOSIS — I10 Essential (primary) hypertension: Secondary | ICD-10-CM | POA: Diagnosis not present

## 2012-03-31 DIAGNOSIS — E7800 Pure hypercholesterolemia, unspecified: Secondary | ICD-10-CM | POA: Diagnosis not present

## 2012-03-31 DIAGNOSIS — E119 Type 2 diabetes mellitus without complications: Secondary | ICD-10-CM | POA: Diagnosis not present

## 2012-04-02 DIAGNOSIS — H353190 Nonexudative age-related macular degeneration, unspecified eye, stage unspecified: Secondary | ICD-10-CM | POA: Diagnosis not present

## 2012-04-03 DIAGNOSIS — E7800 Pure hypercholesterolemia, unspecified: Secondary | ICD-10-CM | POA: Diagnosis not present

## 2012-04-03 DIAGNOSIS — I10 Essential (primary) hypertension: Secondary | ICD-10-CM | POA: Diagnosis not present

## 2012-04-03 DIAGNOSIS — M79609 Pain in unspecified limb: Secondary | ICD-10-CM | POA: Diagnosis not present

## 2012-04-03 DIAGNOSIS — R7309 Other abnormal glucose: Secondary | ICD-10-CM | POA: Diagnosis not present

## 2012-04-03 DIAGNOSIS — Z125 Encounter for screening for malignant neoplasm of prostate: Secondary | ICD-10-CM | POA: Diagnosis not present

## 2012-04-03 DIAGNOSIS — B351 Tinea unguium: Secondary | ICD-10-CM | POA: Diagnosis not present

## 2012-06-26 DIAGNOSIS — B351 Tinea unguium: Secondary | ICD-10-CM | POA: Diagnosis not present

## 2012-06-26 DIAGNOSIS — M79609 Pain in unspecified limb: Secondary | ICD-10-CM | POA: Diagnosis not present

## 2012-06-30 DIAGNOSIS — R7309 Other abnormal glucose: Secondary | ICD-10-CM | POA: Diagnosis not present

## 2012-06-30 DIAGNOSIS — I10 Essential (primary) hypertension: Secondary | ICD-10-CM | POA: Diagnosis not present

## 2012-06-30 DIAGNOSIS — I2510 Atherosclerotic heart disease of native coronary artery without angina pectoris: Secondary | ICD-10-CM | POA: Diagnosis not present

## 2012-06-30 DIAGNOSIS — Z951 Presence of aortocoronary bypass graft: Secondary | ICD-10-CM | POA: Diagnosis not present

## 2012-08-14 DIAGNOSIS — L821 Other seborrheic keratosis: Secondary | ICD-10-CM | POA: Diagnosis not present

## 2012-08-14 DIAGNOSIS — L905 Scar conditions and fibrosis of skin: Secondary | ICD-10-CM | POA: Diagnosis not present

## 2012-08-14 DIAGNOSIS — Z85828 Personal history of other malignant neoplasm of skin: Secondary | ICD-10-CM | POA: Diagnosis not present

## 2012-08-14 DIAGNOSIS — L723 Sebaceous cyst: Secondary | ICD-10-CM | POA: Diagnosis not present

## 2012-09-22 DIAGNOSIS — M5137 Other intervertebral disc degeneration, lumbosacral region: Secondary | ICD-10-CM | POA: Diagnosis not present

## 2012-09-22 DIAGNOSIS — M999 Biomechanical lesion, unspecified: Secondary | ICD-10-CM | POA: Diagnosis not present

## 2012-09-22 DIAGNOSIS — M5430 Sciatica, unspecified side: Secondary | ICD-10-CM | POA: Diagnosis not present

## 2012-09-23 DIAGNOSIS — M999 Biomechanical lesion, unspecified: Secondary | ICD-10-CM | POA: Diagnosis not present

## 2012-09-23 DIAGNOSIS — M5430 Sciatica, unspecified side: Secondary | ICD-10-CM | POA: Diagnosis not present

## 2012-09-23 DIAGNOSIS — M5137 Other intervertebral disc degeneration, lumbosacral region: Secondary | ICD-10-CM | POA: Diagnosis not present

## 2012-09-24 DIAGNOSIS — M999 Biomechanical lesion, unspecified: Secondary | ICD-10-CM | POA: Diagnosis not present

## 2012-09-24 DIAGNOSIS — M5430 Sciatica, unspecified side: Secondary | ICD-10-CM | POA: Diagnosis not present

## 2012-09-24 DIAGNOSIS — M5137 Other intervertebral disc degeneration, lumbosacral region: Secondary | ICD-10-CM | POA: Diagnosis not present

## 2012-09-25 DIAGNOSIS — M5430 Sciatica, unspecified side: Secondary | ICD-10-CM | POA: Diagnosis not present

## 2012-09-25 DIAGNOSIS — M999 Biomechanical lesion, unspecified: Secondary | ICD-10-CM | POA: Diagnosis not present

## 2012-09-25 DIAGNOSIS — M5137 Other intervertebral disc degeneration, lumbosacral region: Secondary | ICD-10-CM | POA: Diagnosis not present

## 2012-09-25 DIAGNOSIS — B351 Tinea unguium: Secondary | ICD-10-CM | POA: Diagnosis not present

## 2012-09-25 DIAGNOSIS — M79609 Pain in unspecified limb: Secondary | ICD-10-CM | POA: Diagnosis not present

## 2012-09-29 DIAGNOSIS — M5430 Sciatica, unspecified side: Secondary | ICD-10-CM | POA: Diagnosis not present

## 2012-09-29 DIAGNOSIS — M5137 Other intervertebral disc degeneration, lumbosacral region: Secondary | ICD-10-CM | POA: Diagnosis not present

## 2012-09-29 DIAGNOSIS — M999 Biomechanical lesion, unspecified: Secondary | ICD-10-CM | POA: Diagnosis not present

## 2012-09-30 DIAGNOSIS — M5430 Sciatica, unspecified side: Secondary | ICD-10-CM | POA: Diagnosis not present

## 2012-09-30 DIAGNOSIS — M5137 Other intervertebral disc degeneration, lumbosacral region: Secondary | ICD-10-CM | POA: Diagnosis not present

## 2012-09-30 DIAGNOSIS — M999 Biomechanical lesion, unspecified: Secondary | ICD-10-CM | POA: Diagnosis not present

## 2012-10-01 DIAGNOSIS — M999 Biomechanical lesion, unspecified: Secondary | ICD-10-CM | POA: Diagnosis not present

## 2012-10-01 DIAGNOSIS — H353190 Nonexudative age-related macular degeneration, unspecified eye, stage unspecified: Secondary | ICD-10-CM | POA: Diagnosis not present

## 2012-10-01 DIAGNOSIS — M5137 Other intervertebral disc degeneration, lumbosacral region: Secondary | ICD-10-CM | POA: Diagnosis not present

## 2012-10-01 DIAGNOSIS — M5430 Sciatica, unspecified side: Secondary | ICD-10-CM | POA: Diagnosis not present

## 2012-10-06 DIAGNOSIS — M5137 Other intervertebral disc degeneration, lumbosacral region: Secondary | ICD-10-CM | POA: Diagnosis not present

## 2012-10-06 DIAGNOSIS — M999 Biomechanical lesion, unspecified: Secondary | ICD-10-CM | POA: Diagnosis not present

## 2012-10-06 DIAGNOSIS — M5430 Sciatica, unspecified side: Secondary | ICD-10-CM | POA: Diagnosis not present

## 2012-10-07 DIAGNOSIS — I10 Essential (primary) hypertension: Secondary | ICD-10-CM | POA: Diagnosis not present

## 2012-10-07 DIAGNOSIS — R7309 Other abnormal glucose: Secondary | ICD-10-CM | POA: Diagnosis not present

## 2012-10-07 DIAGNOSIS — M5137 Other intervertebral disc degeneration, lumbosacral region: Secondary | ICD-10-CM | POA: Diagnosis not present

## 2012-10-07 DIAGNOSIS — M5430 Sciatica, unspecified side: Secondary | ICD-10-CM | POA: Diagnosis not present

## 2012-10-07 DIAGNOSIS — Z125 Encounter for screening for malignant neoplasm of prostate: Secondary | ICD-10-CM | POA: Diagnosis not present

## 2012-10-07 DIAGNOSIS — M999 Biomechanical lesion, unspecified: Secondary | ICD-10-CM | POA: Diagnosis not present

## 2012-10-08 DIAGNOSIS — M5137 Other intervertebral disc degeneration, lumbosacral region: Secondary | ICD-10-CM | POA: Diagnosis not present

## 2012-10-08 DIAGNOSIS — M5430 Sciatica, unspecified side: Secondary | ICD-10-CM | POA: Diagnosis not present

## 2012-10-08 DIAGNOSIS — M999 Biomechanical lesion, unspecified: Secondary | ICD-10-CM | POA: Diagnosis not present

## 2012-10-13 DIAGNOSIS — M5137 Other intervertebral disc degeneration, lumbosacral region: Secondary | ICD-10-CM | POA: Diagnosis not present

## 2012-10-13 DIAGNOSIS — M999 Biomechanical lesion, unspecified: Secondary | ICD-10-CM | POA: Diagnosis not present

## 2012-10-13 DIAGNOSIS — M5430 Sciatica, unspecified side: Secondary | ICD-10-CM | POA: Diagnosis not present

## 2012-10-14 DIAGNOSIS — E7800 Pure hypercholesterolemia, unspecified: Secondary | ICD-10-CM | POA: Diagnosis not present

## 2012-10-14 DIAGNOSIS — I10 Essential (primary) hypertension: Secondary | ICD-10-CM | POA: Diagnosis not present

## 2012-10-14 DIAGNOSIS — E119 Type 2 diabetes mellitus without complications: Secondary | ICD-10-CM | POA: Diagnosis not present

## 2012-10-14 DIAGNOSIS — N400 Enlarged prostate without lower urinary tract symptoms: Secondary | ICD-10-CM | POA: Diagnosis not present

## 2012-10-15 DIAGNOSIS — M5430 Sciatica, unspecified side: Secondary | ICD-10-CM | POA: Diagnosis not present

## 2012-10-15 DIAGNOSIS — M999 Biomechanical lesion, unspecified: Secondary | ICD-10-CM | POA: Diagnosis not present

## 2012-10-15 DIAGNOSIS — M5137 Other intervertebral disc degeneration, lumbosacral region: Secondary | ICD-10-CM | POA: Diagnosis not present

## 2012-10-17 ENCOUNTER — Encounter: Payer: Self-pay | Admitting: Cardiovascular Disease

## 2012-10-20 DIAGNOSIS — M999 Biomechanical lesion, unspecified: Secondary | ICD-10-CM | POA: Diagnosis not present

## 2012-10-20 DIAGNOSIS — M5430 Sciatica, unspecified side: Secondary | ICD-10-CM | POA: Diagnosis not present

## 2012-10-20 DIAGNOSIS — M5137 Other intervertebral disc degeneration, lumbosacral region: Secondary | ICD-10-CM | POA: Diagnosis not present

## 2012-12-22 ENCOUNTER — Ambulatory Visit (INDEPENDENT_AMBULATORY_CARE_PROVIDER_SITE_OTHER): Payer: Medicare Other | Admitting: Podiatry

## 2012-12-22 ENCOUNTER — Encounter: Payer: Self-pay | Admitting: Podiatry

## 2012-12-22 VITALS — BP 154/86 | HR 64 | Resp 12

## 2012-12-22 DIAGNOSIS — B351 Tinea unguium: Secondary | ICD-10-CM | POA: Diagnosis not present

## 2012-12-22 DIAGNOSIS — M79609 Pain in unspecified limb: Secondary | ICD-10-CM | POA: Diagnosis not present

## 2012-12-22 NOTE — Progress Notes (Signed)
Subjective:     Patient ID: Michael Bryant, male   DOB: March 16, 1933, 79 y.o.   MRN: [id number]  HPI patient presents stating my nails are sore thick and I cannot cut them   Review of Systems  All other systems reviewed and are negative.       Objective:   Physical Exam  Nursing note and vitals reviewed. Neurological: He is alert.   nail disease 1-5 bilateral with thick subungual debris there is tropic and painful    Assessment:     Mycotic nail infection with pain 1-5 bilateral    Plan:     Debridement of nailbeds 1-5 bilateral no iatrogenic bleeding noted

## 2013-03-16 ENCOUNTER — Ambulatory Visit: Payer: Medicare Other | Admitting: Podiatry

## 2013-03-23 ENCOUNTER — Ambulatory Visit: Payer: Medicare Other | Admitting: Podiatry

## 2013-03-24 ENCOUNTER — Encounter (HOSPITAL_COMMUNITY): Payer: Self-pay | Admitting: Emergency Medicine

## 2013-03-24 ENCOUNTER — Emergency Department (HOSPITAL_COMMUNITY): Payer: Medicare Other

## 2013-03-24 ENCOUNTER — Observation Stay (HOSPITAL_COMMUNITY)
Admission: EM | Admit: 2013-03-24 | Discharge: 2013-03-25 | Disposition: A | Discharge status: Home / Self Care | Payer: Medicare Other | Attending: Cardiology | Admitting: Cardiology

## 2013-03-24 DIAGNOSIS — Z951 Presence of aortocoronary bypass graft: Secondary | ICD-10-CM | POA: Diagnosis not present

## 2013-03-24 DIAGNOSIS — M79603 Pain in arm, unspecified: Secondary | ICD-10-CM | POA: Diagnosis present

## 2013-03-24 DIAGNOSIS — I10 Essential (primary) hypertension: Secondary | ICD-10-CM

## 2013-03-24 DIAGNOSIS — R079 Chest pain, unspecified: Secondary | ICD-10-CM

## 2013-03-24 DIAGNOSIS — I2510 Atherosclerotic heart disease of native coronary artery without angina pectoris: Secondary | ICD-10-CM | POA: Diagnosis not present

## 2013-03-24 DIAGNOSIS — R5383 Other fatigue: Secondary | ICD-10-CM | POA: Diagnosis not present

## 2013-03-24 DIAGNOSIS — R5381 Other malaise: Secondary | ICD-10-CM | POA: Diagnosis not present

## 2013-03-24 DIAGNOSIS — Z79899 Other long term (current) drug therapy: Secondary | ICD-10-CM | POA: Insufficient documentation

## 2013-03-24 DIAGNOSIS — R110 Nausea: Secondary | ICD-10-CM | POA: Diagnosis not present

## 2013-03-24 DIAGNOSIS — M79609 Pain in unspecified limb: Principal | ICD-10-CM | POA: Insufficient documentation

## 2013-03-24 DIAGNOSIS — I252 Old myocardial infarction: Secondary | ICD-10-CM | POA: Diagnosis not present

## 2013-03-24 DIAGNOSIS — L301 Dyshidrosis [pompholyx]: Secondary | ICD-10-CM | POA: Insufficient documentation

## 2013-03-24 DIAGNOSIS — J984 Other disorders of lung: Secondary | ICD-10-CM | POA: Diagnosis not present

## 2013-03-24 HISTORY — DX: Atherosclerotic heart disease of native coronary artery without angina pectoris: I25.10

## 2013-03-24 HISTORY — DX: Hyperlipidemia, unspecified: E78.5

## 2013-03-24 LAB — CBC WITH DIFFERENTIAL/PLATELET
BASOS ABS: 0 10*3/uL (ref 0.0–0.1)
BASOS PCT: 0 % (ref 0–1)
EOS PCT: 1 % (ref 0–5)
Eosinophils Absolute: 0.1 10*3/uL (ref 0.0–0.7)
HEMATOCRIT: 43.6 % (ref 39.0–52.0)
Hemoglobin: 15.6 g/dL (ref 13.0–17.0)
Lymphocytes Relative: 32 % (ref 12–46)
Lymphs Abs: 1.7 10*3/uL (ref 0.7–4.0)
MCH: 32 pg (ref 26.0–34.0)
MCHC: 35.8 g/dL (ref 30.0–36.0)
MCV: 89.5 fL (ref 78.0–100.0)
MONO ABS: 0.5 10*3/uL (ref 0.1–1.0)
Monocytes Relative: 10 % (ref 3–12)
Neutro Abs: 3.1 10*3/uL (ref 1.7–7.7)
Neutrophils Relative %: 57 % (ref 43–77)
PLATELETS: 191 10*3/uL (ref 150–400)
RBC: 4.87 MIL/uL (ref 4.22–5.81)
RDW: 13.2 % (ref 11.5–15.5)
WBC: 5.5 10*3/uL (ref 4.0–10.5)

## 2013-03-24 LAB — TROPONIN I
Troponin I: <0.3 ng/mL (ref <0.30)
Troponin I: <0.3 ng/mL (ref <0.30)

## 2013-03-24 LAB — BASIC METABOLIC PANEL
BUN: 19 mg/dL (ref 6–23)
CO2: 27 mEq/L (ref 19–32)
CREATININE: 0.86 mg/dL (ref 0.50–1.35)
Calcium: 9.1 mg/dL (ref 8.4–10.5)
Chloride: 101 mEq/L (ref 96–112)
GFR, EST NON AFRICAN AMERICAN: 82 mL/min — AB (ref >90)
Glucose, Bld: 138 mg/dL — ABNORMAL HIGH (ref 70–99)
Potassium: 4.2 mEq/L (ref 3.7–5.3)
Sodium: 139 mEq/L (ref 137–147)

## 2013-03-24 LAB — CBC
HCT: 40.3 % (ref 39.0–52.0)
HEMOGLOBIN: 14.3 g/dL (ref 13.0–17.0)
MCH: 32.1 pg (ref 26.0–34.0)
MCHC: 35.5 g/dL (ref 30.0–36.0)
MCV: 90.6 fL (ref 78.0–100.0)
Platelets: 178 10*3/uL (ref 150–400)
RBC: 4.45 MIL/uL (ref 4.22–5.81)
RDW: 13.4 % (ref 11.5–15.5)
WBC: 6 10*3/uL (ref 4.0–10.5)

## 2013-03-24 LAB — PRO B NATRIURETIC PEPTIDE: PRO B NATRI PEPTIDE: 92.6 pg/mL (ref 0–450)

## 2013-03-24 LAB — MAGNESIUM: Magnesium: 2.1 mg/dL (ref 1.5–2.5)

## 2013-03-24 LAB — PROTIME-INR
INR: 1.04 (ref 0.00–1.49)
Prothrombin Time: 13.4 seconds (ref 11.6–15.2)

## 2013-03-24 LAB — CREATININE, SERUM
Creatinine, Ser: 0.92 mg/dL (ref 0.50–1.35)
GFR calc Af Amer: >90 mL/min (ref >90)
GFR, EST NON AFRICAN AMERICAN: 79 mL/min — AB (ref >90)

## 2013-03-24 MED ORDER — NITROGLYCERIN 0.3 MG SL SUBL: 0.3000 mg | SUBLINGUAL_TABLET | SUBLINGUAL | Status: DC | PRN

## 2013-03-24 MED ORDER — DICLOFENAC SODIUM ER 100 MG PO TB24
100.0000 mg | ORAL_TABLET | Freq: Every day | ORAL | Status: DC
  Administered 2013-03-25: 100 mg via ORAL
  Filled 2013-03-24: qty 1

## 2013-03-24 MED ORDER — ASPIRIN 81 MG PO CHEW
324.0000 mg | CHEWABLE_TABLET | Freq: Once | ORAL | Status: AC
  Administered 2013-03-24: 324 mg via ORAL
  Filled 2013-03-24: qty 4

## 2013-03-24 MED ORDER — PANTOPRAZOLE SODIUM 40 MG PO TBEC
40.0000 mg | DELAYED_RELEASE_TABLET | Freq: Every day | ORAL | Status: DC
  Administered 2013-03-25: 40 mg via ORAL
  Filled 2013-03-24: qty 1

## 2013-03-24 MED ORDER — ONDANSETRON HCL 4 MG/2ML IJ SOLN
4.0000 mg | Freq: Four times a day (QID) | INTRAMUSCULAR | Status: DC | PRN
Start: 2013-03-24 — End: 2013-03-25

## 2013-03-24 MED ORDER — TAMSULOSIN HCL 0.4 MG PO CAPS
0.4000 mg | ORAL_CAPSULE | Freq: Every day | ORAL | Status: DC
  Administered 2013-03-24: 0.4 mg via ORAL
  Filled 2013-03-24 (×2): qty 1

## 2013-03-24 MED ORDER — HEPARIN SODIUM (PORCINE) 5000 UNIT/ML IJ SOLN
5000.0000 [IU] | Freq: Three times a day (TID) | INTRAMUSCULAR | Status: DC
  Administered 2013-03-24 – 2013-03-25 (×2): 5000 [IU] via SUBCUTANEOUS
  Filled 2013-03-24 (×5): qty 1

## 2013-03-24 MED ORDER — NITROGLYCERIN 0.4 MG SL SUBL: 0.4000 mg | SUBLINGUAL_TABLET | SUBLINGUAL | Status: DC | PRN

## 2013-03-24 MED ORDER — OCUVITE PO TABS
1.0000 | ORAL_TABLET | Freq: Every day | ORAL | Status: DC
  Administered 2013-03-25: 1 via ORAL
  Filled 2013-03-24: qty 1

## 2013-03-24 MED ORDER — ASPIRIN EC 81 MG PO TBEC
81.0000 mg | DELAYED_RELEASE_TABLET | Freq: Every day | ORAL | Status: DC
  Administered 2013-03-25: 81 mg via ORAL
  Filled 2013-03-24: qty 1

## 2013-03-24 MED ORDER — OMEGA-3-ACID ETHYL ESTERS 1 G PO CAPS
1.0000 g | ORAL_CAPSULE | Freq: Every day | ORAL | Status: DC
  Administered 2013-03-24: 1 g via ORAL
  Filled 2013-03-24 (×2): qty 1

## 2013-03-24 MED ORDER — ALPRAZOLAM 0.25 MG PO TABS: 0.2500 mg | ORAL_TABLET | Freq: Two times a day (BID) | ORAL | Status: DC | PRN

## 2013-03-24 MED ORDER — ACETAMINOPHEN 325 MG PO TABS: 650.0000 mg | ORAL_TABLET | ORAL | Status: DC | PRN

## 2013-03-24 MED ORDER — ZOLPIDEM TARTRATE 5 MG PO TABS: 5.0000 mg | ORAL_TABLET | Freq: Every evening | ORAL | Status: DC | PRN

## 2013-03-24 MED ORDER — SIMVASTATIN 40 MG PO TABS
40.0000 mg | ORAL_TABLET | Freq: Every day | ORAL | Status: DC
Start: 2013-03-24 — End: 2013-03-25
  Administered 2013-03-24: 40 mg via ORAL
  Filled 2013-03-24 (×2): qty 1

## 2013-03-24 MED ORDER — TRANDOLAPRIL 1 MG PO TABS
1.0000 mg | ORAL_TABLET | Freq: Every day | ORAL | Status: DC
  Administered 2013-03-25: 1 mg via ORAL
  Filled 2013-03-24: qty 1

## 2013-03-24 MED ORDER — SODIUM CHLORIDE 0.9 % IV SOLN: INTRAVENOUS | Status: DC

## 2013-03-24 NOTE — ED Notes (Signed)
Pt's family leaving and requesting to be called when he moves to a room. Numbers are as follows: Hoyle Sauer (wife) Cell: [phone] Shauna Hugh (daughter) [phone]

## 2013-03-24 NOTE — ED Provider Notes (Signed)
CSN: [id number]     Arrival date & time 03/24/13  1504 History   First MD Initiated Contact with Patient 03/24/13 1509     Chief Complaint  Patient presents with  . Arm Pain  . Weakness   (Consider location/radiation/quality/duration/timing/severity/associated sxs/prior Treatment) HPI Comments: Patient presents to the for evaluation of left arm pain. Patient reports an aching pain in the left arm first noticed earlier today. He reports that he then became nauseated and diaphoretic. Patient does have a previous history of V. fib arrest and bypass surgery in 2003. Patient reports he never had chest pain with his heart disease, ileus symptoms he had was the arm pain that he is experiencing today. Arrival to the ER, however, pain has resolved. He is not short of breath.  Patient is a 80 y.o. male presenting with arm pain and weakness.  Arm Pain Pertinent negatives include no chest pain and no shortness of breath.  Weakness Pertinent negatives include no chest pain and no shortness of breath.    Past Medical History  Diagnosis Date  . MI (myocardial infarction)   . Hypertension    Past Surgical History  Procedure Laterality Date  . Coronary artery bypass graft     No family history on file. History  Substance Use Topics  . Smoking status: Never Smoker   . Smokeless tobacco: Not on file  . Alcohol Use: No    Review of Systems  Constitutional: Positive for diaphoresis.  Respiratory: Negative for shortness of breath.   Cardiovascular: Negative for chest pain.  Gastrointestinal: Positive for nausea.  All other systems reviewed and are negative.    Allergies  Codeine  Home Medications   Current Outpatient Rx  Name  Route  Sig  Dispense  Refill  . diclofenac sodium (VOLTAREN) 1 % GEL   Topical   Apply topically.         . fish oil-omega-3 fatty acids 1000 MG capsule   Oral   Take 2 g by mouth daily.         . nitroGLYCERIN (NITROSTAT) 0.3 MG SL tablet    Sublingual   Place 0.3 mg under the tongue every 5 (five) minutes as needed for chest pain.         Marland Kitchen omeprazole (PRILOSEC) 10 MG capsule   Oral   Take 10 mg by mouth daily.         . simvastatin (ZOCOR) 10 MG tablet   Oral   Take 10 mg by mouth at bedtime.          BP 156/80  Pulse 73  Temp(Src) 97.3 F (36.3 C) (Oral)  Resp 16  Ht 5\' 10"  (1.778 m)  Wt 188 lb (85.276 kg)  BMI 26.98 kg/m2  SpO2 96% Physical Exam  Constitutional: He is oriented to person, place, and time. He appears well-developed and well-nourished. No distress.  HENT:  Head: Normocephalic and atraumatic.  Right Ear: Hearing normal.  Left Ear: Hearing normal.  Nose: Nose normal.  Mouth/Throat: Oropharynx is clear and moist and mucous membranes are normal.  Eyes: Conjunctivae and EOM are normal. Pupils are equal, round, and reactive to light.  Neck: Normal range of motion. Neck supple.  Cardiovascular: Regular rhythm, S1 normal and S2 normal.  Exam reveals no gallop and no friction rub.   No murmur heard. Pulmonary/Chest: Effort normal and breath sounds normal. No respiratory distress. He exhibits no tenderness.  Abdominal: Soft. Normal appearance and bowel sounds are normal. There is no  hepatosplenomegaly. There is no tenderness. There is no rebound, no guarding, no tenderness at McBurney's point and negative Murphy's sign. No hernia.  Musculoskeletal: Normal range of motion.  Neurological: He is alert and oriented to person, place, and time. He has normal strength. No cranial nerve deficit or sensory deficit. Coordination normal. GCS eye subscore is 4. GCS verbal subscore is 5. GCS motor subscore is 6.  Skin: Skin is warm, dry and intact. No rash noted. No cyanosis.  Psychiatric: He has a normal mood and affect. His speech is normal and behavior is normal. Thought content normal.    ED Course  Procedures (including critical care time) Labs Review Labs Reviewed  BASIC METABOLIC PANEL - Abnormal;  Notable for the following:    Glucose, Bld 138 (*)    GFR calc non Af Amer 82 (*)    All other components within normal limits  CBC WITH DIFFERENTIAL  PRO B NATRIURETIC PEPTIDE  TROPONIN I   Imaging Review Dg Chest 2 View  03/24/2013   CLINICAL DATA:  Chest pain  EXAM: CHEST  2 VIEW  COMPARISON:  07/07/2009  FINDINGS: Left lower lobe scarring unchanged. Prior CABG. Negative for heart failure or pneumonia. No pleural effusion and no change from the prior study.  IMPRESSION: No active cardiopulmonary disease.   Electronically Signed   By: Franchot Gallo M.D.   On: 03/24/2013 15:48    EKG Interpretation    Date/Time:  Tuesday March 24 2013 15:07:07 EST Ventricular Rate:  73 PR Interval:  172 QRS Duration: 88 QT Interval:  370 QTC Calculation: 407 R Axis:   62 Text Interpretation:  Normal sinus rhythm Normal ECG Confirmed by Hurshel Bouillon  MD, Ally Knodel ([id number]) on 03/24/2013 3:21:31 PM            MDM  Diagnosis: Left arm pain, possible cardiac etiology  Patient presents to the ER for evaluation of left arm pain. Patient has a significant cardiac history. Patient has a history of V. fib arrest requiring multivessel bypass surgery. Patient reports that his only symptom prior to his V. fib arrest as well as pain in his upper arms similar to what is experiencing today. Patient's workup has been reassuring. His EKG is normal, troponin is negative. Cardiology has been consult to evaluate the patient for further management.    Orpah Greek, MD 03/24/13 1725

## 2013-03-24 NOTE — Progress Notes (Addendum)
Pt not on outpt BB.  I am not starting due to plans for GXT in AM.  If enzymes become positive we will need to add BB.  Also to change VTE prophylaxis to IV Heparin.

## 2013-03-24 NOTE — ED Notes (Signed)
Transporting patient to new room assignment. 

## 2013-03-24 NOTE — H&P (Signed)
CARDIOLOGY ADMISSION NOTE  Patient ID: Michael Bryant MRN: [id number] DOB/AGE: Mar 28, 1933 80 y.o.  Admit date: 03/24/2013 Primary Physician   [name], MD Primary Cardiologist   Dr. Sallyanne Kuster Chief Complaint    Chest pain  HPI:  The patient presents with a history of CAD, CABG.  He presents today with a sudden episode of left arm pain.  This occurred at rest.  It was 3/10 in intensity.  He might have felt a hot flash and been briefly nauseated.  He did not have neck or chest pain.  He has had some discomfort similar to this in the past.  However, this was not like his presentation with ventricular fib in 2003.  He otherwise has felt well.  The patient denies any new symptoms such as shortness of breath, PND or orthopnea. There have been no reported palpitations, presyncope or syncope.  He walks an hour every day and did this today without symptoms.  In the ER his first set of enzymes was normal.  BNP was normal.  EKG demonstrated no acute changes.  He has otherwise been pain free.     Past Medical History  Diagnosis Date  . MI (myocardial infarction)   . Hypertension     Past Surgical History  Procedure Laterality Date  . Coronary artery bypass graft      Allergies  Allergen Reactions  . Codeine Other (See Comments)    "wont go to sleep"   Prior to Admission medications   Medication Sig Start Date End Date Taking? Authorizing Provider  aspirin EC 81 MG tablet Take 81 mg by mouth daily.   Yes Historical Provider, MD  beta carotene w/minerals (OCUVITE) tablet Take 1 tablet by mouth daily.   Yes Historical Provider, MD  budesonide (RHINOCORT AQUA) 32 MCG/ACT nasal spray Place 2 sprays into both nostrils daily as needed for allergies.   Yes Historical Provider, MD  CINNAMON PO Take 1 capsule by mouth at bedtime.   Yes Historical Provider, MD  Diclofenac Sodium CR 100 MG 24 hr tablet Take 100 mg by mouth daily.  03/02/13  Yes Historical Provider, MD  fish oil-omega-3 fatty acids 1000  MG capsule Take 1 g by mouth at bedtime.    Yes Historical Provider, MD  Glucosamine HCl (GLUCOSAMINE PO) Take 1 tablet by mouth 2 (two) times daily.   Yes Historical Provider, MD  nitroGLYCERIN (NITROSTAT) 0.3 MG SL tablet Place 0.3 mg under the tongue every 5 (five) minutes as needed for chest pain.   Yes Historical Provider, MD  omeprazole (PRILOSEC) 10 MG capsule Take 10 mg by mouth daily.   Yes Historical Provider, MD  Polyethyl Glycol-Propyl Glycol (SYSTANE OP) Place 1 drop into both eyes daily as needed (dry eyes).   Yes Historical Provider, MD  Saw Palmetto, Serenoa repens, (SAW PALMETTO PO) Take 1 tablet by mouth daily.   Yes Historical Provider, MD  simvastatin (ZOCOR) 40 MG tablet Take 40 mg by mouth at bedtime.   Yes Historical Provider, MD  tamsulosin (FLOMAX) 0.4 MG CAPS capsule Take 0.4 mg by mouth daily after supper.  03/02/13  Yes Historical Provider, MD  trandolapril (MAVIK) 1 MG tablet Take 1 mg by mouth daily.  03/04/13  Yes Historical Provider, MD    History   Social History  . Marital Status: Married    Spouse Name: N/A    Number of Children: N/A  . Years of Education: N/A   Occupational History  . Not on file.  Social History Main Topics  . Smoking status: Never Smoker   . Smokeless tobacco: Not on file  . Alcohol Use: No  . Drug Use: No  . Sexual Activity: Not on file   Other Topics Concern  . Not on file   Social History Narrative  . No narrative on file    No family history on file.   ROS:  As stated in the HPI and negative for all other systems.  Physical Exam: Blood pressure 165/84, pulse 57, temperature 97.3 F (36.3 C), temperature source Oral, resp. rate 11, height 5\' 10"  (1.778 m), weight 188 lb (85.276 kg), SpO2 96.00%.  GENERAL:  Well appearing HEENT:  Pupils equal round and reactive, fundi not visualized, oral mucosa unremarkable NECK:  No jugular venous distention, waveform within normal limits, carotid upstroke brisk and symmetric, no  bruits, no thyromegaly LYMPHATICS:  No cervical, inguinal adenopathy LUNGS:  Clear to auscultation bilaterally BACK:  No CVA tenderness CHEST:  Well healed sternotomy scar. HEART:  PMI not displaced or sustained,S1 and S2 within normal limits, no S3, no S4, no clicks, no rubs, no murmurs ABD:  Flat, positive bowel sounds normal in frequency in pitch, no bruits, no rebound, no guarding, no midline pulsatile mass, no hepatomegaly, no splenomegaly EXT:  2 plus pulses throughout, no edema, no cyanosis no clubbing SKIN:  No rashes no nodules NEURO:  Cranial nerves II through XII grossly intact, motor grossly intact throughout PSYCH:  Cognitively intact, oriented to person place and time   Labs: Lab Results  Component Value Date   BUN 19 03/24/2013   Lab Results  Component Value Date   CREATININE 0.86 03/24/2013   Lab Results  Component Value Date   NA 139 03/24/2013   K 4.2 03/24/2013   CL 101 03/24/2013   CO2 27 03/24/2013   Lab Results  Component Value Date   TROPONINI <0.30 03/24/2013   Lab Results  Component Value Date   WBC 5.5 03/24/2013   HGB 15.6 03/24/2013   HCT 43.6 03/24/2013   MCV 89.5 03/24/2013   PLT 191 03/24/2013      Radiology:  CXR:  Left lower lobe scarring unchanged. Prior CABG. Negative for heart  failure or pneumonia. No pleural effusion and no change from the  prior study.   EKG:  Sinus rhythm, rate 73, axis within normal limits, intervals within normal limits, no acute ST-T wave changes.  ASSESSMENT AND PLAN:    CHEST PAIN:  His chest pain has atypical and typical features.  I will cycle cardiac enzymes.  If he has no further complaints and rules out he can have an in patient exercise myoview in the AM.    CAD:  As above   HTN:  His BP is elevated today.  However, this is quite unusual for him.  I will continue his current therapy.    HYPERLIPIDEMIA: This can be evaluated by [name], MD and Dr. Sallyanne Kuster   Signed: Minus Breeding 03/24/2013, 5:49  PM

## 2013-03-24 NOTE — ED Notes (Signed)
Pt reports left arm pain, some numbness since yesterday. Denies chest pain, cardiac history. Reports nausea and diaphoresis. No shortness of breath.

## 2013-03-25 ENCOUNTER — Observation Stay (HOSPITAL_COMMUNITY): Payer: Medicare Other

## 2013-03-25 DIAGNOSIS — I2510 Atherosclerotic heart disease of native coronary artery without angina pectoris: Secondary | ICD-10-CM

## 2013-03-25 DIAGNOSIS — R5383 Other fatigue: Secondary | ICD-10-CM | POA: Diagnosis not present

## 2013-03-25 DIAGNOSIS — R55 Syncope and collapse: Secondary | ICD-10-CM | POA: Diagnosis not present

## 2013-03-25 DIAGNOSIS — I10 Essential (primary) hypertension: Secondary | ICD-10-CM

## 2013-03-25 DIAGNOSIS — M79609 Pain in unspecified limb: Secondary | ICD-10-CM | POA: Diagnosis not present

## 2013-03-25 DIAGNOSIS — R5381 Other malaise: Secondary | ICD-10-CM | POA: Diagnosis not present

## 2013-03-25 LAB — BASIC METABOLIC PANEL
BUN: 17 mg/dL (ref 6–23)
CHLORIDE: 102 meq/L (ref 96–112)
CO2: 27 mEq/L (ref 19–32)
Calcium: 8.9 mg/dL (ref 8.4–10.5)
Creatinine, Ser: 0.83 mg/dL (ref 0.50–1.35)
GFR calc non Af Amer: 83 mL/min — ABNORMAL LOW (ref >90)
GLUCOSE: 114 mg/dL — AB (ref 70–99)
Potassium: 3.8 mEq/L (ref 3.7–5.3)
Sodium: 141 mEq/L (ref 137–147)

## 2013-03-25 LAB — CBC
HEMATOCRIT: 41.7 % (ref 39.0–52.0)
HEMOGLOBIN: 14.7 g/dL (ref 13.0–17.0)
MCH: 31.3 pg (ref 26.0–34.0)
MCHC: 35.3 g/dL (ref 30.0–36.0)
MCV: 88.9 fL (ref 78.0–100.0)
Platelets: 183 10*3/uL (ref 150–400)
RBC: 4.69 MIL/uL (ref 4.22–5.81)
RDW: 13.4 % (ref 11.5–15.5)
WBC: 5.3 10*3/uL (ref 4.0–10.5)

## 2013-03-25 LAB — TROPONIN I

## 2013-03-25 LAB — TSH: TSH: 1.895 u[IU]/mL (ref 0.350–4.500)

## 2013-03-25 LAB — LIPID PANEL
Cholesterol: 122 mg/dL (ref 0–200)
HDL: 52 mg/dL (ref >39)
LDL CALC: 49 mg/dL (ref 0–99)
Total CHOL/HDL Ratio: 2.3 RATIO
Triglycerides: 104 mg/dL (ref <150)
VLDL: 21 mg/dL (ref 0–40)

## 2013-03-25 MED ORDER — TECHNETIUM TC 99M SESTAMIBI GENERIC - CARDIOLITE
10.0000 | Freq: Once | INTRAVENOUS | Status: AC | PRN
  Administered 2013-03-25: 10 via INTRAVENOUS

## 2013-03-25 MED ORDER — TECHNETIUM TC 99M SESTAMIBI GENERIC - CARDIOLITE
30.0000 | Freq: Once | INTRAVENOUS | Status: AC | PRN
  Administered 2013-03-25: 30 via INTRAVENOUS

## 2013-03-25 NOTE — Progress Notes (Signed)
Subjective: No further left arm pain.   Objective: Vital signs in last 24 hours: Temp:  [97.3 F (36.3 C)-98 F (36.7 C)] 97.7 F (36.5 C) (01/14 0540) Pulse Rate:  [55-73] 73 (01/14 0540) Resp:  [11-18] 18 (01/14 0540) BP: (141-189)/(48-92) 141/81 mmHg (01/14 0540) SpO2:  [93 %-99 %] 93 % (01/14 0540) Weight:  [187 lb 11.2 oz (85.14 kg)-188 lb (85.276 kg)] 187 lb 11.2 oz (85.14 kg) (01/13 2140) Last BM Date: 03/24/13  Intake/Output from previous day:   Intake/Output this shift:    Medications Current Facility-Administered Medications  Medication Dose Route Frequency Provider Last Rate Last Dose  . 0.9 %  sodium chloride infusion   Intravenous Continuous Cecilie Kicks, NP      . acetaminophen (TYLENOL) tablet 650 mg  650 mg Oral Q4H PRN Cecilie Kicks, NP      . ALPRAZolam Duanne Moron) tablet 0.25 mg  0.25 mg Oral BID PRN Cecilie Kicks, NP      . aspirin EC tablet 81 mg  81 mg Oral Daily Cecilie Kicks, NP   81 mg at 03/25/13 1002  . beta carotene w/minerals (OCUVITE) tablet 1 tablet  1 tablet Oral Daily Cecilie Kicks, NP   1 tablet at 03/25/13 1002  . Diclofenac Sodium CR 100 mg  100 mg Oral Daily Cecilie Kicks, NP   100 mg at 03/25/13 1003  . heparin injection 5,000 Units  5,000 Units Subcutaneous Q8H Cecilie Kicks, NP   5,000 Units at 03/25/13 [id number]  . nitroGLYCERIN (NITROSTAT) SL tablet 0.4 mg  0.4 mg Sublingual Q5 Min x 3 PRN Cecilie Kicks, NP      . omega-3 acid ethyl esters (LOVAZA) capsule 1 g  1 g Oral QHS Cecilie Kicks, NP   1 g at 03/24/13 2205  . ondansetron (ZOFRAN) injection 4 mg  4 mg Intravenous Q6H PRN Cecilie Kicks, NP      . pantoprazole (PROTONIX) EC tablet 40 mg  40 mg Oral Daily Cecilie Kicks, NP   40 mg at 03/25/13 1002  . simvastatin (ZOCOR) tablet 40 mg  40 mg Oral QHS Cecilie Kicks, NP   40 mg at 03/24/13 2205  . tamsulosin (FLOMAX) capsule 0.4 mg  0.4 mg Oral QPC supper Cecilie Kicks, NP   0.4 mg at 03/24/13 2205  . trandolapril (MAVIK) tablet 1 mg  1 mg Oral Daily  Cecilie Kicks, NP   1 mg at 03/25/13 1002  . zolpidem (AMBIEN) tablet 5 mg  5 mg Oral QHS PRN Cecilie Kicks, NP        PE: General appearance: alert, cooperative and no distress Lungs: clear to auscultation bilaterally Heart: regular rate and rhythm, S1, S2 normal, no murmur, click, rub or gallop Extremities: No LEE Pulses: 2+ and symmetric Skin: Warm and dry Neurologic: Grossly normal  Lab Results:   Recent Labs  03/24/13 1612 03/24/13 2253 03/25/13 0312  WBC 5.5 6.0 5.3  HGB 15.6 14.3 14.7  HCT 43.6 40.3 41.7  PLT 191 178 183   BMET  Recent Labs  03/24/13 1612 03/24/13 2253 03/25/13 0312  NA 139  --  141  K 4.2  --  3.8  CL 101  --  102  CO2 27  --  27  GLUCOSE 138*  --  114*  BUN 19  --  17  CREATININE 0.86 0.92 0.83  CALCIUM 9.1  --  8.9   PT/INR  Recent Labs  03/24/13 2253  LABPROT 13.4  INR 1.04  Cholesterol  Recent Labs  03/25/13 0312  CHOL 122   Lipid Panel     Component Value Date/Time   CHOL 122 03/25/2013 0312   TRIG 104 03/25/2013 0312   HDL 52 03/25/2013 0312   CHOLHDL 2.3 03/25/2013 0312   VLDL 21 03/25/2013 0312   LDLCALC 49 03/25/2013 0312    Assessment/Plan    Principal Problem:   Arm pain Active Problems:   CAD (coronary artery disease), hx CABG 2003  Plan:  Ruled out for MI.  BP a little on the high side.  Beta blocker held for GXT today.  Sinus brady on tele with PACs.  Possible DC later today.   LOS: 1 day    Bryant, Michael 03/25/2013 10:13 AM  Citlalli Weikel, H  An exercise stress test today, very active at home, presentation very atypical from his anginal pain, possible discharge later today.   Ena Dawley, Lemmie Evens 03/25/2013

## 2013-03-25 NOTE — Discharge Summary (Signed)
Physician Discharge Summary  Patient ID: Michael Bryant MRN: [id number] DOB/AGE: July 10, 1933 80 y.o.  Admit date: 03/24/2013 Discharge date: 03/25/2013  Admission Diagnoses:  Left Arm pain  Discharge Diagnoses:  Principal Problem:   Arm pain Active Problems:   CAD (coronary artery disease), hx CABG 2003   Essential hypertension   Discharged Condition: stable  Hospital Course:  The patient presents with a history of CAD, CABG. He presents today with a sudden episode of left arm pain. This occurred at rest. It was 3/10 in intensity. He might have felt a hot flash and been briefly nauseated. He did not have neck or chest pain. He has had some discomfort similar to this in the past. However, this was not like his presentation with ventricular fib in 2003. He otherwise has felt well. The patient denies any new symptoms such as shortness of breath, PND or orthopnea. There have been no reported palpitations, presyncope or syncope. He walks an hour every day and did this today without symptoms. In the ER his first set of enzymes was normal. BNP was normal. EKG demonstrated no acute changes. He has otherwise been pain free.   The patient was admitted and ruled out for MI.  Stress test revealed EF of 60% and no evidence of induced ischemia.   BP was elevated but the patient reports it usually runs in the 120's.  He will monitor at home and call the office if it is above 130/80.  We will increase trandolapril if needed.  The patient was seen by Dr. Meda Coffee who felt he was stable for DC home.   Consults: None  Significant Diagnostic Studies:  Stress test IMPRESSION: 1. Minimal attenuation involving the inferior and to a lesser extent, the anterior walls of the left ventricle, without associated wall motion abnormalities. No definitive scintigraphic evidence of prior infarction or exercise induced ischemia. 2. Normal wall motion. Ejection fraction - 60%  Treatments: See Above  Discharge  Exam: Blood pressure 169/86, pulse 73, temperature 97.7 F (36.5 C), temperature source Oral, resp. rate 18, height 5\' 10"  (1.778 m), weight 187 lb 11.2 oz (85.14 kg), SpO2 93.00%.   Disposition:   Discharge Orders   Future Appointments Provider Department Dept Phone   04/02/2013 2:00 PM Wallene Huh, [facility] [place]   Future Orders Complete By Expires   Diet - low sodium heart healthy  As directed        Medication List         aspirin EC 81 MG tablet  Take 81 mg by mouth daily.     beta carotene w/minerals tablet  Take 1 tablet by mouth daily.     CINNAMON PO  Take 1 capsule by mouth at bedtime.     Diclofenac Sodium CR 100 MG 24 hr tablet  Take 100 mg by mouth daily.     fish oil-omega-3 fatty acids 1000 MG capsule  Take 1 g by mouth at bedtime.     GLUCOSAMINE PO  Take 1 tablet by mouth 2 (two) times daily.     nitroGLYCERIN 0.3 MG SL tablet  Commonly known as:  NITROSTAT  Place 0.3 mg under the tongue every 5 (five) minutes as needed for chest pain.     omeprazole 10 MG capsule  Commonly known as:  PRILOSEC  Take 10 mg by mouth daily.     RHINOCORT AQUA 32 MCG/ACT nasal spray  Generic drug:  budesonide  Place 2 sprays into both  nostrils daily as needed for allergies.     SAW PALMETTO PO  Take 1 tablet by mouth daily.     simvastatin 40 MG tablet  Commonly known as:  ZOCOR  Take 40 mg by mouth at bedtime.     SYSTANE OP  Place 1 drop into both eyes daily as needed (dry eyes).     tamsulosin 0.4 MG Caps capsule  Commonly known as:  FLOMAX  Take 0.4 mg by mouth daily after supper.     trandolapril 1 MG tablet  Commonly known as:  MAVIK  Take 1 mg by mouth daily.           Follow-up Information   Follow up with Sanda Klein, MD. (Our office will call you with the appt date and time.)    Specialty:  Cardiology   Contact information:   [address] [place] [address] [phone]        Signed: Tarri Fuller 03/25/2013, 5:22 PM

## 2013-03-25 NOTE — Progress Notes (Signed)
UR completed 

## 2013-03-25 NOTE — Discharge Instructions (Signed)
Chest Pain (Nonspecific) °It is often hard to give a specific diagnosis for the cause of chest pain. There is always a chance that your pain could be related to something serious, such as a heart attack or a blood clot in the lungs. You need to follow up with your caregiver for further evaluation. °CAUSES  °· Heartburn. °· Pneumonia or bronchitis. °· Anxiety or stress. °· Inflammation around your heart (pericarditis) or lung (pleuritis or pleurisy). °· A blood clot in the lung. °· A collapsed lung (pneumothorax). It can develop suddenly on its own (spontaneous pneumothorax) or from injury (trauma) to the chest. °· Shingles infection (herpes zoster virus). °The chest wall is composed of bones, muscles, and cartilage. Any of these can be the source of the pain. °· The bones can be bruised by injury. °· The muscles or cartilage can be strained by coughing or overwork. °· The cartilage can be affected by inflammation and become sore (costochondritis). °DIAGNOSIS  °Lab tests or other studies, such as X-rays, electrocardiography, stress testing, or cardiac imaging, may be needed to find the cause of your pain.  °TREATMENT  °· Treatment depends on what may be causing your chest pain. Treatment may include: °· Acid blockers for heartburn. °· Anti-inflammatory medicine. °· Pain medicine for inflammatory conditions. °· Antibiotics if an infection is present. °· You may be advised to change lifestyle habits. This includes stopping smoking and avoiding alcohol, caffeine, and chocolate. °· You may be advised to keep your head raised (elevated) when sleeping. This reduces the chance of acid going backward from your stomach into your esophagus. °· Most of the time, nonspecific chest pain will improve within 2 to 3 days with rest and mild pain medicine. °HOME CARE INSTRUCTIONS  °· If antibiotics were prescribed, take your antibiotics as directed. Finish them even if you start to feel better. °· For the next few days, avoid physical  activities that bring on chest pain. Continue physical activities as directed. °· Do not smoke. °· Avoid drinking alcohol. °· Only take over-the-counter or prescription medicine for pain, discomfort, or fever as directed by your caregiver. °· Follow your caregiver's suggestions for further testing if your chest pain does not go away. °· Keep any follow-up appointments you made. If you do not go to an appointment, you could develop lasting (chronic) problems with pain. If there is any problem keeping an appointment, you must call to reschedule. °SEEK MEDICAL CARE IF:  °· You think you are having problems from the medicine you are taking. Read your medicine instructions carefully. °· Your chest pain does not go away, even after treatment. °· You develop a rash with blisters on your chest. °SEEK IMMEDIATE MEDICAL CARE IF:  °· You have increased chest pain or pain that spreads to your arm, neck, jaw, back, or abdomen. °· You develop shortness of breath, an increasing cough, or you are coughing up blood. °· You have severe back or abdominal pain, feel nauseous, or vomit. °· You develop severe weakness, fainting, or chills. °· You have a fever. °THIS IS AN EMERGENCY. Do not wait to see if the pain will go away. Get medical help at once. Call your local emergency services (911 in [place] Do not drive yourself to the hospital. °MAKE SURE YOU:  °· Understand these instructions. °· Will watch your condition. °· Will get help right away if you are not doing well or get worse. °Document Released: 12/06/2004 Document Revised: 05/21/2011 Document Reviewed: 10/02/2007 °ExitCare® Patient Information ©2014 [employer]   [employer]. ° °

## 2013-03-26 NOTE — Discharge Summary (Signed)
Michael Bryant, H 03/26/2013  

## 2013-03-30 ENCOUNTER — Encounter: Payer: Self-pay | Admitting: Physician Assistant

## 2013-03-30 ENCOUNTER — Ambulatory Visit (INDEPENDENT_AMBULATORY_CARE_PROVIDER_SITE_OTHER): Payer: Medicare Other | Admitting: Physician Assistant

## 2013-03-30 ENCOUNTER — Telehealth: Payer: Self-pay | Admitting: *Deleted

## 2013-03-30 VITALS — BP 130/80 | HR 68 | Ht 70.0 in | Wt 194.0 lb

## 2013-03-30 DIAGNOSIS — I10 Essential (primary) hypertension: Secondary | ICD-10-CM | POA: Diagnosis not present

## 2013-03-30 MED ORDER — TRANDOLAPRIL 2 MG PO TABS: 2.0000 mg | ORAL_TABLET | Freq: Every day | ORAL | Status: DC

## 2013-03-30 NOTE — Patient Instructions (Addendum)
1.  Increase Trandolapril to 2mg  daily. 2.  Check your BP cuff against a manual device.  Target BP showed be between 110-130/60-80 3.  Follow up with Dr. Sallyanne Kuster in April.

## 2013-03-30 NOTE — Assessment & Plan Note (Signed)
BP is right at 130/80 in the office, however, at home, it is running in the 140-150's and at the hospital in the 170's.  I am going to increase his trandolapril to 2mg .  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  Follow up with Dr. Sallyanne Kuster in April.

## 2013-03-30 NOTE — Progress Notes (Signed)
Date:  03/30/2013   ID:  [name], DOB 1933-05-12, MRN [id number]  PCP:  [name], MD  Primary Cardiologist:  [facility]     History of Present Illness: Michael Bryant is a 80 y.o. male with a history of CAD, CABG. He presented to the hospital on 03/24/13 with a sudden episode of left arm pain.  He has had some discomfort similar to this in the past. However, this was not like his presentation with ventricular fib in 2003. He was admitted and ruled out for MI. Stress test revealed EF of 60% and no evidence of induced ischemia. BP was elevated but the patient reports it usually runs in the 120's. He was asked to monitor it at home and call the office if it is above 130/80.  He reports today that his pressure has been running in the 140-150's.  He currently denies nausea, vomiting, fever, chest pain, shortness of breath, orthopnea, dizziness, PND, cough, congestion, abdominal pain, hematochezia, melena, lower extremity edema, claudication.  Wt Readings from Last 3 Encounters:  03/30/13 194 lb (87.998 kg)  03/24/13 187 lb 11.2 oz (85.14 kg)     Past Medical History  Diagnosis Date  . MI (myocardial infarction)   . CAD (coronary artery disease)   . Hyperlipidemia     Current Outpatient Prescriptions  Medication Sig Dispense Refill  . aspirin EC 81 MG tablet Take 81 mg by mouth daily.      . beta carotene w/minerals (OCUVITE) tablet Take 1 tablet by mouth daily.      . budesonide (RHINOCORT AQUA) 32 MCG/ACT nasal spray Place 2 sprays into both nostrils daily as needed for allergies.      Marland Kitchen CINNAMON PO Take 1 capsule by mouth at bedtime.      . Diclofenac Sodium CR 100 MG 24 hr tablet Take 100 mg by mouth daily.       . fish oil-omega-3 fatty acids 1000 MG capsule Take 1 g by mouth at bedtime.       . Glucosamine HCl (GLUCOSAMINE PO) Take 1 tablet by mouth 2 (two) times daily.      . nitroGLYCERIN (NITROSTAT) 0.3 MG SL tablet Place 0.3 mg under the tongue every 5 (five) minutes as  needed for chest pain.      Marland Kitchen omeprazole (PRILOSEC) 10 MG capsule Take 10 mg by mouth daily.      Michael Bryant Glycol-Propyl Glycol (SYSTANE OP) Place 1 drop into both eyes daily as needed (dry eyes).      . Saw Palmetto, Serenoa repens, (SAW PALMETTO PO) Take 1 tablet by mouth daily.      . simvastatin (ZOCOR) 40 MG tablet Take 40 mg by mouth at bedtime.      . tamsulosin (FLOMAX) 0.4 MG CAPS capsule Take 0.4 mg by mouth daily after supper.       . trandolapril (MAVIK) 2 MG tablet Take 1 tablet (2 mg total) by mouth daily.  30 tablet  5   No current facility-administered medications for this visit.    Allergies:    Allergies  Allergen Reactions  . Codeine Other (See Comments)    "wont go to sleep"    Social History:  The patient  reports that he has quit smoking. His smoking use included Cigarettes. He smoked 0.00 packs per day. He does not have any smokeless tobacco history on file. He reports that he does not drink alcohol or use illicit drugs.   Family history:  Family History  Problem Relation Age of Onset  . CAD Father 92    Died age 16  . Cancer Mother 56    ROS:  Please see the history of present illness.  All other systems reviewed and negative.   PHYSICAL EXAM: VS:  BP 130/80  Pulse 68  Ht 5\' 10"  (1.778 m)  Wt 194 lb (87.998 kg)  BMI 27.84 kg/m2 Well nourished, well developed, in no acute distress HEENT: Pupils are equal round react to light accommodation extraocular movements are intact.  Neck: no JVDNo cervical lymphadenopathy. Cardiac: Regular rate and rhythm without murmurs rubs or gallops. Lungs:  clear to auscultation bilaterally, no wheezing, rhonchi or rales Ext: no lower extremity edema.  2+ radial pulses. Skin: warm and dry Neuro:  Grossly normal   ASSESSMENT AND PLAN:  Problem List Items Addressed This Visit   Essential hypertension - Primary     BP is right at 130/80 in the office, however, at home, it is running in the 140-150's and at the  hospital in the 170's.  I am going to increase his trandolapril to 2mg .  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  Follow up with Michael Bryant in April.    Relevant Medications      trandolapril (MAVIK) tablet

## 2013-04-02 ENCOUNTER — Encounter: Payer: Self-pay | Admitting: Podiatry

## 2013-04-02 ENCOUNTER — Ambulatory Visit (INDEPENDENT_AMBULATORY_CARE_PROVIDER_SITE_OTHER): Payer: Medicare Other | Admitting: Podiatry

## 2013-04-02 VITALS — BP 134/65 | HR 65 | Resp 16

## 2013-04-02 DIAGNOSIS — M79609 Pain in unspecified limb: Secondary | ICD-10-CM

## 2013-04-02 DIAGNOSIS — B351 Tinea unguium: Secondary | ICD-10-CM | POA: Diagnosis not present

## 2013-04-02 NOTE — Progress Notes (Signed)
Subjective:     Patient ID: Michael Bryant, male   DOB: 1933/09/13, 80 y.o.   MRN: [id number]  HPI patient presents with thick nailbeds that are painful 1-5 both feet that he cannot cut himself   Review of Systems     Objective:   Physical Exam Neurovascular status unchanged with thick painful nail bed 1-5 both feet that are incurvated in the corner    Assessment:     Mycotic nail infection with pain 1-5 both feet    Plan:     Debridement painful nailbeds 1-5 both feet

## 2013-04-03 DIAGNOSIS — H353190 Nonexudative age-related macular degeneration, unspecified eye, stage unspecified: Secondary | ICD-10-CM | POA: Diagnosis not present

## 2013-04-13 DIAGNOSIS — E119 Type 2 diabetes mellitus without complications: Secondary | ICD-10-CM | POA: Diagnosis not present

## 2013-04-13 DIAGNOSIS — I10 Essential (primary) hypertension: Secondary | ICD-10-CM | POA: Diagnosis not present

## 2013-04-16 DIAGNOSIS — E7800 Pure hypercholesterolemia, unspecified: Secondary | ICD-10-CM | POA: Diagnosis not present

## 2013-04-16 DIAGNOSIS — I10 Essential (primary) hypertension: Secondary | ICD-10-CM | POA: Diagnosis not present

## 2013-04-16 DIAGNOSIS — Z125 Encounter for screening for malignant neoplasm of prostate: Secondary | ICD-10-CM | POA: Diagnosis not present

## 2013-04-16 DIAGNOSIS — R7309 Other abnormal glucose: Secondary | ICD-10-CM | POA: Diagnosis not present

## 2013-04-24 DIAGNOSIS — R05 Cough: Secondary | ICD-10-CM | POA: Diagnosis not present

## 2013-04-24 DIAGNOSIS — R059 Cough, unspecified: Secondary | ICD-10-CM | POA: Diagnosis not present

## 2013-06-11 DIAGNOSIS — R9431 Abnormal electrocardiogram [ECG] [EKG]: Secondary | ICD-10-CM | POA: Diagnosis not present

## 2013-06-11 DIAGNOSIS — I499 Cardiac arrhythmia, unspecified: Secondary | ICD-10-CM | POA: Diagnosis not present

## 2013-06-11 DIAGNOSIS — Z8674 Personal history of sudden cardiac arrest: Secondary | ICD-10-CM | POA: Diagnosis not present

## 2013-06-11 DIAGNOSIS — R002 Palpitations: Secondary | ICD-10-CM | POA: Diagnosis not present

## 2013-06-11 DIAGNOSIS — Z8679 Personal history of other diseases of the circulatory system: Secondary | ICD-10-CM | POA: Diagnosis not present

## 2013-06-11 DIAGNOSIS — M47814 Spondylosis without myelopathy or radiculopathy, thoracic region: Secondary | ICD-10-CM | POA: Diagnosis not present

## 2013-06-11 DIAGNOSIS — I4729 Other ventricular tachycardia: Secondary | ICD-10-CM | POA: Diagnosis not present

## 2013-06-11 DIAGNOSIS — I472 Ventricular tachycardia: Secondary | ICD-10-CM | POA: Diagnosis not present

## 2013-06-12 DIAGNOSIS — I472 Ventricular tachycardia: Secondary | ICD-10-CM | POA: Diagnosis not present

## 2013-06-12 DIAGNOSIS — Z79899 Other long term (current) drug therapy: Secondary | ICD-10-CM | POA: Diagnosis not present

## 2013-06-12 DIAGNOSIS — I10 Essential (primary) hypertension: Secondary | ICD-10-CM | POA: Diagnosis not present

## 2013-06-12 DIAGNOSIS — Z7982 Long term (current) use of aspirin: Secondary | ICD-10-CM | POA: Diagnosis not present

## 2013-06-12 DIAGNOSIS — Z87891 Personal history of nicotine dependence: Secondary | ICD-10-CM | POA: Diagnosis not present

## 2013-06-12 DIAGNOSIS — I4729 Other ventricular tachycardia: Secondary | ICD-10-CM | POA: Diagnosis not present

## 2013-06-12 DIAGNOSIS — I4949 Other premature depolarization: Secondary | ICD-10-CM | POA: Diagnosis not present

## 2013-06-12 DIAGNOSIS — I499 Cardiac arrhythmia, unspecified: Secondary | ICD-10-CM | POA: Diagnosis not present

## 2013-06-12 DIAGNOSIS — Z8249 Family history of ischemic heart disease and other diseases of the circulatory system: Secondary | ICD-10-CM | POA: Diagnosis not present

## 2013-06-12 DIAGNOSIS — I359 Nonrheumatic aortic valve disorder, unspecified: Secondary | ICD-10-CM | POA: Diagnosis not present

## 2013-06-12 DIAGNOSIS — Z951 Presence of aortocoronary bypass graft: Secondary | ICD-10-CM | POA: Diagnosis not present

## 2013-06-12 DIAGNOSIS — E785 Hyperlipidemia, unspecified: Secondary | ICD-10-CM | POA: Diagnosis not present

## 2013-06-12 DIAGNOSIS — I2510 Atherosclerotic heart disease of native coronary artery without angina pectoris: Secondary | ICD-10-CM | POA: Diagnosis not present

## 2013-06-16 ENCOUNTER — Ambulatory Visit (INDEPENDENT_AMBULATORY_CARE_PROVIDER_SITE_OTHER): Payer: Medicare Other | Admitting: Cardiology

## 2013-06-16 ENCOUNTER — Encounter: Payer: Self-pay | Admitting: Cardiology

## 2013-06-16 VITALS — BP 126/80 | HR 60 | Ht 70.0 in | Wt 193.8 lb

## 2013-06-16 DIAGNOSIS — I4729 Other ventricular tachycardia: Secondary | ICD-10-CM | POA: Diagnosis not present

## 2013-06-16 DIAGNOSIS — I252 Old myocardial infarction: Secondary | ICD-10-CM | POA: Insufficient documentation

## 2013-06-16 DIAGNOSIS — R000 Tachycardia, unspecified: Secondary | ICD-10-CM | POA: Diagnosis not present

## 2013-06-16 DIAGNOSIS — I2510 Atherosclerotic heart disease of native coronary artery without angina pectoris: Secondary | ICD-10-CM

## 2013-06-16 DIAGNOSIS — I10 Essential (primary) hypertension: Secondary | ICD-10-CM

## 2013-06-16 DIAGNOSIS — I472 Ventricular tachycardia: Secondary | ICD-10-CM

## 2013-06-16 HISTORY — DX: Other ventricular tachycardia: I47.29

## 2013-06-16 HISTORY — DX: Ventricular tachycardia: I47.2

## 2013-06-16 NOTE — Assessment & Plan Note (Signed)
Neg MI in [place] and April

## 2013-06-16 NOTE — Assessment & Plan Note (Signed)
controlled 

## 2013-06-16 NOTE — Progress Notes (Signed)
06/16/2013   PCP: [name], MD   Chief Complaint  Patient presents with  . Hospitalization Follow-up    Heart was out of rhythm, started on new medication Amiodaron 200 mg - 2 tabs BID. Patient reports no complaints,    Primary Cardiologist: Dr. Sallyanne Kuster   HPI: The 80 year old white married male with coronary artery disease including bypass grafting after V. fib arrest in 2003 with LIMA to LAD, vein graft to OM1, and vein graft to distal RCA. Has treated hyperlipidemia and had been doing well. In January of this year he had left arm pain and was seen at, and negative for MI but underwent a stress Myoview which was negative for ischemia EF was 60%.  April 2 and third he was in [place] and developed- feeling very cold  and then palpitations and felt terrible.   911 was called he was found to have frequent PVCs and runs of nonsustained ventricular tachycardia at a local hospital he was then transferred to [facility] in [place]. He was placed on IV amiodarone and is now on by mouth amiodarone.  He has no chest pain or shortness of breath. No awareness of premature beats since discharge.  In the past he has had severe bradycardia with beta blocker so is no longer on beta blockers.     Allergies  Allergen Reactions  . Codeine Other (See Comments)    "wont go to sleep"    Current Outpatient Prescriptions  Medication Sig Dispense Refill  . amiodarone (PACERONE) 200 MG tablet Take 2 tablets by mouth 2 (two) times daily.      Marland Kitchen aspirin EC 81 MG tablet Take 81 mg by mouth daily.      Marland Kitchen azelastine (ASTELIN) 137 MCG/SPRAY nasal spray Place 1 spray into both nostrils daily as needed.      . beta carotene w/minerals (OCUVITE) tablet Take 1 tablet by mouth daily.      Marland Kitchen CINNAMON PO Take 1 capsule by mouth at bedtime.      . fish oil-omega-3 fatty acids 1000 MG capsule Take 1 g by mouth at bedtime.       . Glucosamine HCl (GLUCOSAMINE PO) Take 1 tablet by mouth 2 (two)  times daily.      . nitroGLYCERIN (NITROSTAT) 0.3 MG SL tablet Place 0.3 mg under the tongue every 5 (five) minutes as needed for chest pain.      Marland Kitchen omeprazole (PRILOSEC) 10 MG capsule Take 10 mg by mouth daily.      [name] (SYSTANE OP) Place 1 drop into both eyes daily as needed (dry eyes).      . Saw Palmetto, Serenoa repens, (SAW PALMETTO PO) Take 1 tablet by mouth daily.      . simvastatin (ZOCOR) 40 MG tablet Take 40 mg by mouth at bedtime.      . tamsulosin (FLOMAX) 0.4 MG CAPS capsule Take 0.4 mg by mouth daily after supper.       . Diclofenac Sodium CR 100 MG 24 hr tablet Take 100 mg by mouth daily.        No current facility-administered medications for this visit.    Past Medical History  Diagnosis Date  . MI (myocardial infarction)   . CAD (coronary artery disease)   . Hyperlipidemia   . NSVT (nonsustained ventricular tachycardia) 06/16/2013    Past Surgical History  Procedure Laterality Date  . Coronary artery bypass graft  LIMA to LAD, SVG to OM, SVG to RCA  . Tonsillectomy and adenoidectomy    . Cardiac catheterization      TMA:[id number]:TG colds or fevers, no weight changes, no further irregular HR since discharge 06/12/13 Skin:no rashes or ulcers HEENT:no blurred vision, no congestion CV:see HPI PUL:see HPI GI:no diarrhea constipation or melena, no indigestion GU:no hematuria, no dysuria MS:no joint pain, no claudication Neuro:no syncope, no lightheadedness Endo:no diabetes, no thyroid disease  PHYSICAL EXAM BP 126/80  Pulse 60  Ht 5\' 10"  (1.778 m)  Wt 193 lb 12.8 oz (87.907 kg)  BMI 27.81 kg/m2 General:Pleasant affect, NAD Skin:Warm and dry, brisk capillary refill HEENT:normocephalic, sclera clear, mucus membranes moist Neck:supple, no JVD, no bruits  Heart:S1S2 RRR without murmur, gallup, rub or click Lungs:clear without rales, rhonchi, or wheezes YBW:LSLH, non tender, + BS, do not palpate liver spleen or masses Ext:no  lower ext edema, 2+ pedal pulses, 2+ radial pulses Neuro:alert and oriented, MAE, follows commands, + facial symmetry  EKG:SR rate of 60, no acute changes.   ASSESSMENT AND PLAN NSVT (nonsustained ventricular tachycardia) Pt was in [place]. And after having sexual intercourse he became very cold and weak, then he developed skips and forceful heart beats.  He called 911 and in ER was found to have freq PVCs and NSVT , treated with amiodarone. This occurred 06/12/13.  He is now on po amiodarone.  Will place 2 week event monitor to find PVC burden and monitor for bradycardia, pt not on BB due to symptomatic bradycardia.  He will see Dr. Sallyanne Kuster back on April 21 at that time decision will be made if he needs a cardiac catheterization.  Prior to his bypass surgery he did have V. fib, patient never had chest pain. Patient had a negative exercise Myoview done in January of this year which was negative for ischemia and no obvious infarctions or scars.  His echo done in [place] he have was 60-65% with mild aortic valve sclerosis and trace tricuspid regurg.   If he has problems prior to his April 21 visit he will call.   CAD (coronary artery disease), hx CABG 2003 No chest pain and no shortness of breath.  Emergency CABG 12/16/2001 with a witnessed V. fib arrest defibrillated taken to the Cath Lab and found to have significant LAD disease with intraluminal thrombus. He underwent LIMA-LAD, VG-2 OM 1, VG-distal RCA.  Negative exercise Myoview,  January 2015, EF is normal.  Essential hypertension controlled  Myocardial infarction, old Neg MI in [place] and April

## 2013-06-16 NOTE — Assessment & Plan Note (Signed)
Pt was in [place]. And after having sexual intercourse he became very cold and weak, then he developed skips and forceful heart beats.  He called 911 and in ER was found to have freq PVCs and NSVT , treated with amiodarone. This occurred 06/12/13.  He is now on po amiodarone.  Will place 2 week event monitor to find PVC burden and monitor for bradycardia, pt not on BB due to symptomatic bradycardia.  He will see Dr. Sallyanne Kuster back on April 21 at that time decision will be made if he needs a cardiac catheterization.  Prior to his bypass surgery he did have V. fib, patient never had chest pain. Patient had a negative exercise Myoview done in January of this year which was negative for ischemia and no obvious infarctions or scars.  His echo done in [place] he have was 60-65% with mild aortic valve sclerosis and trace tricuspid regurg.   If he has problems prior to his April 21 visit he will call.

## 2013-06-16 NOTE — Assessment & Plan Note (Signed)
No chest pain and no shortness of breath.  Emergency CABG 12/16/2001 with a witnessed V. fib arrest defibrillated taken to the Cath Lab and found to have significant LAD disease with intraluminal thrombus. He underwent LIMA-LAD, VG-2 OM 1, VG-distal RCA.  Negative exercise Myoview,  January 2015, EF is normal.

## 2013-06-16 NOTE — Patient Instructions (Signed)
Keep appt with Dr. Sallyanne Kuster  Wear event monitor for 2 weeks.

## 2013-06-25 DIAGNOSIS — L821 Other seborrheic keratosis: Secondary | ICD-10-CM | POA: Diagnosis not present

## 2013-06-25 DIAGNOSIS — Z85828 Personal history of other malignant neoplasm of skin: Secondary | ICD-10-CM | POA: Diagnosis not present

## 2013-06-25 DIAGNOSIS — L723 Sebaceous cyst: Secondary | ICD-10-CM | POA: Diagnosis not present

## 2013-06-29 ENCOUNTER — Encounter: Payer: Self-pay | Admitting: Podiatry

## 2013-06-29 ENCOUNTER — Ambulatory Visit (INDEPENDENT_AMBULATORY_CARE_PROVIDER_SITE_OTHER): Payer: Medicare Other | Admitting: Podiatry

## 2013-06-29 DIAGNOSIS — M79609 Pain in unspecified limb: Secondary | ICD-10-CM | POA: Diagnosis not present

## 2013-06-29 DIAGNOSIS — B351 Tinea unguium: Secondary | ICD-10-CM | POA: Diagnosis not present

## 2013-06-30 ENCOUNTER — Ambulatory Visit (INDEPENDENT_AMBULATORY_CARE_PROVIDER_SITE_OTHER): Payer: Medicare Other | Admitting: Cardiovascular Disease

## 2013-06-30 ENCOUNTER — Encounter: Payer: Self-pay | Admitting: Cardiovascular Disease

## 2013-06-30 ENCOUNTER — Encounter: Payer: Self-pay | Admitting: *Deleted

## 2013-06-30 VITALS — BP 142/88 | HR 52 | Ht 70.0 in | Wt 191.5 lb

## 2013-06-30 DIAGNOSIS — D689 Coagulation defect, unspecified: Secondary | ICD-10-CM

## 2013-06-30 DIAGNOSIS — R5381 Other malaise: Secondary | ICD-10-CM

## 2013-06-30 DIAGNOSIS — I2510 Atherosclerotic heart disease of native coronary artery without angina pectoris: Secondary | ICD-10-CM | POA: Diagnosis not present

## 2013-06-30 DIAGNOSIS — Z79899 Other long term (current) drug therapy: Secondary | ICD-10-CM | POA: Diagnosis not present

## 2013-06-30 DIAGNOSIS — R5383 Other fatigue: Secondary | ICD-10-CM

## 2013-06-30 DIAGNOSIS — I472 Ventricular tachycardia: Secondary | ICD-10-CM | POA: Diagnosis not present

## 2013-06-30 DIAGNOSIS — I4729 Other ventricular tachycardia: Secondary | ICD-10-CM

## 2013-06-30 DIAGNOSIS — Z01818 Encounter for other preprocedural examination: Secondary | ICD-10-CM

## 2013-06-30 LAB — CBC
HCT: 42.9 % (ref 39.0–52.0)
Hemoglobin: 14.7 g/dL (ref 13.0–17.0)
MCH: 31.6 pg (ref 26.0–34.0)
MCHC: 34.3 g/dL (ref 30.0–36.0)
MCV: 92.3 fL (ref 78.0–100.0)
Platelets: 225 10*3/uL (ref 150–400)
RBC: 4.65 MIL/uL (ref 4.22–5.81)
RDW: 14.5 % (ref 11.5–15.5)
WBC: 7.1 10*3/uL (ref 4.0–10.5)

## 2013-06-30 LAB — PROTIME-INR
INR: 1.05 (ref <1.50)
Prothrombin Time: 13.6 seconds (ref 11.6–15.2)

## 2013-06-30 LAB — APTT: aPTT: 31 seconds (ref 24–37)

## 2013-06-30 NOTE — Patient Instructions (Addendum)
Dr Sallyanne Kuster has requested that you have a cardiac catheterization with grafts. Cardiac catheterization is used to diagnose and/or treat various heart conditions. Doctors may recommend this procedure for a number of different reasons. The most common reason is to evaluate chest pain. Chest pain can be a symptom of coronary artery disease (CAD), and cardiac catheterization can show whether plaque is narrowing or blocking your heart's arteries. This procedure is also used to evaluate the valves, as well as measure the blood flow and oxygen levels in different parts of your heart. For further information please visit [email or web address]. Please follow instruction sheet, as given.  Dr Sallyanne Kuster has ordered you to have some blood work done prior to your procedure. The scheduler will tell you when to get this done. Please have this done at any [facility] lab.

## 2013-07-01 ENCOUNTER — Encounter (HOSPITAL_COMMUNITY): Payer: Self-pay | Admitting: Pharmacy Technician

## 2013-07-01 ENCOUNTER — Other Ambulatory Visit: Payer: Self-pay | Admitting: *Deleted

## 2013-07-01 DIAGNOSIS — I472 Ventricular tachycardia: Secondary | ICD-10-CM

## 2013-07-01 DIAGNOSIS — I2510 Atherosclerotic heart disease of native coronary artery without angina pectoris: Secondary | ICD-10-CM

## 2013-07-01 DIAGNOSIS — I4729 Other ventricular tachycardia: Secondary | ICD-10-CM

## 2013-07-01 LAB — BASIC METABOLIC PANEL
BUN: 22 mg/dL (ref 6–23)
CO2: 27 mEq/L (ref 19–32)
CREATININE: 0.96 mg/dL (ref 0.50–1.35)
Calcium: 8.6 mg/dL (ref 8.4–10.5)
Chloride: 103 mEq/L (ref 96–112)
Glucose, Bld: 113 mg/dL — ABNORMAL HIGH (ref 70–99)
POTASSIUM: 4.3 meq/L (ref 3.5–5.3)
SODIUM: 140 meq/L (ref 135–145)

## 2013-07-01 LAB — TSH: TSH: 3.23 u[IU]/mL (ref 0.350–4.500)

## 2013-07-01 NOTE — Progress Notes (Signed)
Patient ID: Michael Bryant, male   DOB: 1933/06/19, 80 y.o.   MRN: [id number]      Reason for office visit Followup event monitor, syncope  Michael Bryant is now roughly 12 years status post multivessel bypass surgery (this was prompted by workup after a witnessed ventricular fibrillation arrest in the setting of acute LAD infarction, not associated with chest pain or other anginal equivalents) and has preserved left ventricle systolic function.  He had an episode of what sounds like full-blown syncope following sexual intercourse a few weeks ago. He thinks he may have completely lost consciousness very briefly and woke up feeling very weak and extremely cold. When 911 emergency services arrived was found to have extremely frequent PVCs and nonsustained ventricular tachycardia on the monitor. This all happened in [place]. He was prescribed a beta blocker which we had avoided in the past due to symptomatic bradycardia. He had a normal stress test just 3 months ago. The study described "minimal attenuation involving the inferior and to a lesser extent the anterior walls of the left ventricle without associated wall motion abnormalities" and an ejection fraction of 60%. There was no evidence of reversible ischemia.  He has worn an event monitor that recorded only one isolated 4 beat run of nonsustained ventricular tachycardia that he was unaware of. He has had occasional mild bradycardia but did have one 2.2 second pause that occurred around 7:30 in the morning when he was convinced that he was awake. He does not remember the exact date but he does remember having to go back to lie in bed when he felt a little tired one morning while wearing the monitor   Allergies  Allergen Reactions  . Codeine Other (See Comments)    "wont go to sleep"    Current Outpatient Prescriptions  Medication Sig Dispense Refill  . amiodarone (PACERONE) 200 MG tablet Take 200 mg by mouth daily.       Marland Kitchen aspirin EC 81 MG tablet  Take 81 mg by mouth daily.      . beta carotene w/minerals (OCUVITE) tablet Take 1 tablet by mouth daily.      Marland Kitchen CINNAMON PO Take 1 capsule by mouth at bedtime.      . fish oil-omega-3 fatty acids 1000 MG capsule Take 1 g by mouth at bedtime.       . Glucosamine HCl (GLUCOSAMINE PO) Take 1 tablet by mouth 2 (two) times daily.      . nitroGLYCERIN (NITROSTAT) 0.3 MG SL tablet Place 0.3 mg under the tongue every 5 (five) minutes as needed for chest pain.      Marland Kitchen omeprazole (PRILOSEC) 10 MG capsule Take 10 mg by mouth daily.      [name] (SYSTANE OP) Place 1 drop into both eyes daily as needed (dry eyes).      . Saw Palmetto, Serenoa repens, (SAW PALMETTO PO) Take 1 tablet by mouth daily.      . simvastatin (ZOCOR) 40 MG tablet Take 40 mg by mouth at bedtime.      . tamsulosin (FLOMAX) 0.4 MG CAPS capsule Take 0.4 mg by mouth daily after supper.        No current facility-administered medications for this visit.    Past Medical History  Diagnosis Date  . MI (myocardial infarction)   . CAD (coronary artery disease)   . Hyperlipidemia   . NSVT (nonsustained ventricular tachycardia) 06/16/2013    Past Surgical History  Procedure Laterality Date  .  Coronary artery bypass graft  2004    LIMA to LAD, SVG to OM, SVG to RCA  . Tonsillectomy and adenoidectomy    . Cardiac catheterization  12/15/2001    60% proximal LAD narrowing with active mobile thrombosis w/50% sequential areas,50-60% pro  . Nm myocar perf wall motion  12/09/2009    mod. perfusion defect basal inferior and mid inferior regions c/w  infarct/scar, EF 54%    Family History  Problem Relation Age of Onset  . CAD Father 15    Died age 59  . Cancer Mother 32    History   Social History  . Marital Status: Married    Spouse Name: N/A    Number of Children: 2  . Years of Education: N/A   Occupational History  . Retired    Social History Main Topics  . Smoking status: Former Smoker    Types:  Cigarettes  . Smokeless tobacco: Not on file     Comment: Quit 1993  . Alcohol Use: No  . Drug Use: No  . Sexual Activity: Not on file   Other Topics Concern  . Not on file   Social History Narrative   Lives at home with wife.           Review of systems: The patient specifically denies any chest pain at rest or with exertion, dyspnea at rest or with exertion, orthopnea, paroxysmal nocturnal dyspnea, syncope, palpitations, focal neurological deficits, intermittent claudication, lower extremity edema, unexplained weight gain, cough, hemoptysis or wheezing.  The patient also denies abdominal pain, nausea, vomiting, dysphagia, diarrhea, constipation, polyuria, polydipsia, dysuria, hematuria, frequency, urgency, abnormal bleeding or bruising, fever, chills, unexpected weight changes, mood swings, change in skin or hair texture, change in voice quality, auditory or visual problems, allergic reactions or rashes, new musculoskeletal complaints other than usual "aches and pains".   PHYSICAL EXAM BP 142/88  Pulse 52  Ht 5\' 10"  (1.778 m)  Wt 191 lb 8 oz (86.864 kg)  BMI 27.48 kg/m2  General: Alert, oriented x3, no distress Head: no evidence of trauma, PERRL, EOMI, no exophtalmos or lid lag, no myxedema, no xanthelasma; normal ears, nose and oropharynx Neck: normal jugular venous pulsations and no hepatojugular reflux; brisk carotid pulses without delay and no carotid bruits Chest: clear to auscultation, no signs of consolidation by percussion or palpation, normal fremitus, symmetrical and full respiratory excursions; healed sternotomy Cardiovascular: normal position and quality of the apical impulse, regular rhythm, normal first and second heart sounds, no murmurs, rubs or gallops Abdomen: no tenderness or distention, no masses by palpation, no abnormal pulsatility or arterial bruits, normal bowel sounds, no hepatosplenomegaly Extremities: no clubbing, cyanosis or edema; 2+ radial, ulnar and  brachial pulses bilaterally; 2+ right femoral, posterior tibial and dorsalis pedis pulses; 2+ left femoral, posterior tibial and dorsalis pedis pulses; no subclavian or femoral bruits Neurological: grossly nonfocal   EKG: Sinus bradycardia  Lipid Panel     Component Value Date/Time   CHOL 122 03/25/2013 0312   TRIG 104 03/25/2013 0312   HDL 52 03/25/2013 0312   CHOLHDL 2.3 03/25/2013 0312   VLDL 21 03/25/2013 0312   LDLCALC 49 03/25/2013 0312    BMET    Component Value Date/Time   NA 140 06/30/2013 1653   K 4.3 06/30/2013 1653   CL 103 06/30/2013 1653   CO2 27 06/30/2013 1653   GLUCOSE 113* 06/30/2013 1653   BUN 22 06/30/2013 1653   CREATININE 0.96 06/30/2013 1653   CREATININE  0.83 03/25/2013 0312   CALCIUM 8.6 06/30/2013 1653   GFRNONAA 83* 03/25/2013 0312   GFRAA >90 03/25/2013 3664     ASSESSMENT AND PLAN  Notwithstanding the normal recent nuclear stress test, I am concerned that Mr. Shouse has ischemia-related to ventricular tachyarrhythmia as a sign of new coronary insufficiency. I think we should followup for definitive diagnosis with a cardiac catheterization. He has never had angina with previous episodes of coronary insufficiency and ventricular tachycardia has been his way of manifesting coronary disease in the past.  We discussed the risks and benefits of diagnostic catheterization as well as possible angioplasty and stenting in detail. He understands and agrees to proceed.  For now he is on low-dose amiodarone. She was unable to tolerate the high loading dose that was prescribed in [place] and reduce it to 200 mg once a day. Depending on the results of his cardiac catheterization and we may decide to refer him for electrophysiology consult.  Orders Placed This Encounter  Procedures  . CBC  . Basic metabolic panel  . Protime-INR  . APTT  . TSH  . EKG 12-Lead  . LEFT HEART CATHETERIZATION WITH CORONARY/GRAFT ANGIOGRAM     Alden Bensinger  Sanda Klein, MD,  [facility] [phone] office [phone] pager

## 2013-07-01 NOTE — Progress Notes (Signed)
Subjective:     Patient ID: Michael Bryant, male   DOB: Jul 03, 1933, 80 y.o.   MRN: [id number]  HPI patient presents with thick damage nailbeds 1-5 both feet that he cannot cut   Review of Systems     Objective:   Physical Exam Neurovascular status intact with thick nailbeds 1-5 both feet that are painful and brittle    Assessment:     Mycotic nail infection with pain 1-5 both feet    Plan:     Debridement painful nail bed 1-5 both feet with no bleeding noted

## 2013-07-02 ENCOUNTER — Encounter (HOSPITAL_COMMUNITY)
Admission: RE | Disposition: A | Discharge status: Home / Self Care | Payer: Self-pay | Source: Ambulatory Visit | Attending: Cardiovascular Disease

## 2013-07-02 ENCOUNTER — Ambulatory Visit (HOSPITAL_COMMUNITY)
Admission: RE | Admit: 2013-07-02 | Discharge: 2013-07-02 | Disposition: A | Discharge status: Home / Self Care | Payer: Medicare Other | Source: Ambulatory Visit | Attending: Cardiovascular Disease | Admitting: Cardiovascular Disease

## 2013-07-02 DIAGNOSIS — M79603 Pain in arm, unspecified: Secondary | ICD-10-CM

## 2013-07-02 DIAGNOSIS — I252 Old myocardial infarction: Secondary | ICD-10-CM | POA: Insufficient documentation

## 2013-07-02 DIAGNOSIS — Z8674 Personal history of sudden cardiac arrest: Secondary | ICD-10-CM | POA: Diagnosis not present

## 2013-07-02 DIAGNOSIS — E785 Hyperlipidemia, unspecified: Secondary | ICD-10-CM | POA: Diagnosis not present

## 2013-07-02 DIAGNOSIS — I2584 Coronary atherosclerosis due to calcified coronary lesion: Secondary | ICD-10-CM | POA: Diagnosis not present

## 2013-07-02 DIAGNOSIS — Z79899 Other long term (current) drug therapy: Secondary | ICD-10-CM | POA: Insufficient documentation

## 2013-07-02 DIAGNOSIS — I4729 Other ventricular tachycardia: Secondary | ICD-10-CM

## 2013-07-02 DIAGNOSIS — Z951 Presence of aortocoronary bypass graft: Secondary | ICD-10-CM | POA: Insufficient documentation

## 2013-07-02 DIAGNOSIS — I472 Ventricular tachycardia: Secondary | ICD-10-CM

## 2013-07-02 DIAGNOSIS — I2582 Chronic total occlusion of coronary artery: Secondary | ICD-10-CM | POA: Insufficient documentation

## 2013-07-02 DIAGNOSIS — Z87891 Personal history of nicotine dependence: Secondary | ICD-10-CM | POA: Diagnosis not present

## 2013-07-02 DIAGNOSIS — I2510 Atherosclerotic heart disease of native coronary artery without angina pectoris: Secondary | ICD-10-CM | POA: Insufficient documentation

## 2013-07-02 DIAGNOSIS — Z8679 Personal history of other diseases of the circulatory system: Secondary | ICD-10-CM

## 2013-07-02 DIAGNOSIS — Z7982 Long term (current) use of aspirin: Secondary | ICD-10-CM | POA: Insufficient documentation

## 2013-07-02 DIAGNOSIS — R55 Syncope and collapse: Secondary | ICD-10-CM

## 2013-07-02 HISTORY — PX: LEFT HEART CATHETERIZATION WITH CORONARY/GRAFT ANGIOGRAM: SHX5450

## 2013-07-02 SURGERY — LEFT HEART CATHETERIZATION WITH CORONARY/GRAFT ANGIOGRAM
Anesthesia: LOCAL

## 2013-07-02 MED ORDER — HYDRALAZINE HCL 20 MG/ML IJ SOLN
10.0000 mg | Freq: Once | INTRAMUSCULAR | Status: AC
  Administered 2013-07-02: 10 mg via INTRAVENOUS

## 2013-07-02 MED ORDER — NITROGLYCERIN 0.2 MG/ML ON CALL CATH LAB
INTRAVENOUS | Status: AC
  Filled 2013-07-02: qty 1

## 2013-07-02 MED ORDER — SODIUM CHLORIDE 0.9 % IV SOLN: 1.0000 mL/kg/h | INTRAVENOUS | Status: DC

## 2013-07-02 MED ORDER — HEPARIN (PORCINE) IN NACL 2-0.9 UNIT/ML-% IJ SOLN
INTRAMUSCULAR | Status: AC
  Filled 2013-07-02: qty 1000

## 2013-07-02 MED ORDER — LIDOCAINE HCL (PF) 1 % IJ SOLN
INTRAMUSCULAR | Status: AC
  Filled 2013-07-02: qty 30

## 2013-07-02 MED ORDER — SODIUM CHLORIDE 0.9 % IV SOLN
INTRAVENOUS | Status: DC
  Administered 2013-07-02: 12:00:00 via INTRAVENOUS

## 2013-07-02 MED ORDER — SODIUM CHLORIDE 0.9 % IJ SOLN: 3.0000 mL | INTRAMUSCULAR | Status: DC | PRN

## 2013-07-02 MED ORDER — HYDRALAZINE HCL 20 MG/ML IJ SOLN
INTRAMUSCULAR | Status: AC
  Filled 2013-07-02: qty 1

## 2013-07-02 NOTE — Discharge Instructions (Signed)
Angiography, Care After °Refer to this sheet in the next few weeks. These instructions provide you with information on caring for yourself after your procedure. Your health care provider may also give you more specific instructions. Your treatment has been planned according to current medical practices, but problems sometimes occur. Call your health care provider if you have any problems or questions after your procedure.  °WHAT TO EXPECT AFTER THE PROCEDURE °After your procedure, it is typical to have the following sensations: °· Minor discomfort or tenderness and a small bump at the catheter insertion site. The bump should usually decrease in size and tenderness within 1 to 2 weeks. °· Any bruising will usually fade within 2 to 4 weeks. °HOME CARE INSTRUCTIONS  °· You may need to keep taking blood thinners if they were prescribed for you. Only take over-the-counter or prescription medicines for pain, fever, or discomfort as directed by your health care provider. °· Do not apply powder or lotion to the site. °· Do not sit in a bathtub, swimming pool, or whirlpool for 5 to 7 days. °· You may shower 24 hours after the procedure. Remove the bandage (dressing) and gently wash the site with plain soap and water. Gently pat the site dry. °· Inspect the site at least twice daily. °· Limit your activity for the first 48 hours. Do not bend, squat, or lift anything over 20 lb (9 kg) or as directed by your health care provider. °· Do not drive home if you are discharged the day of the procedure. Have someone else drive you. Follow instructions about when you can drive or return to work. °SEEK MEDICAL CARE IF: °· You get lightheaded when standing up. °· You have drainage (other than a small amount of blood on the dressing). °· You have chills. °· You have a fever. °· You have redness, warmth, swelling, or pain at the insertion site. °SEEK IMMEDIATE MEDICAL CARE IF:  °· You develop chest pain or shortness of breath, feel faint,  or pass out. °· You have bleeding, swelling larger than a walnut, or drainage from the catheter insertion site. °· You develop pain, discoloration, coldness, or severe bruising in the leg or arm that held the catheter. °· You develop bleeding from any other place, such as the bowels. You may see bright red blood in your urine or stools, or your stools may appear black and tarry. °· You have heavy bleeding from the site. If this happens, hold pressure on the site. °MAKE SURE YOU: °· Understand these instructions. °· Will watch your condition. °· Will get help right away if you are not doing well or get worse. °Document Released: 09/14/2004 Document Revised: 10/29/2012 Document Reviewed: 07/21/2012 °[employer]® Patient Information ©2014 [employer]. ° °

## 2013-07-02 NOTE — Interval H&P Note (Signed)
History and Physical Interval Note:  07/02/2013 8:36 AM  Michael Bryant  has presented today for surgery, with the diagnosis of CP  The various methods of treatment have been discussed with the patient and family. After consideration of risks, benefits and other options for treatment, the patient has consented to  Procedure(s): LEFT HEART CATHETERIZATION WITH CORONARY/GRAFT ANGIOGRAM (N/A) as a surgical intervention .  The patient's history has been reviewed, patient examined, no change in status, stable for surgery.  I have reviewed the patient's chart and labs.  Questions were answered to the patient's satisfaction.     Michael Bryant

## 2013-07-02 NOTE — Op Note (Signed)
CARDIAC CATHETERIZATION REPORT   Procedures performed:  1. Left heart catheterization  2. Selective coronary angiography  3. Selective LIMA bypass angiography 4. Selective SVG bypass angiography x 2 5. Left ventriculography   Reason for procedure:  CAD s/p CABG VT Syncope  Procedure performed by: Sanda Klein, MD, [facility]  Complications: none   Estimated blood loss: less than 5 mL   History:  80 year old man 12 years status post multivessel bypass surgery (this was prompted by workup after a witnessed ventricular fibrillation arrest in the setting of acute LAD infarction, not associated with chest pain or other anginal equivalents) and has preserved left ventricle systolic function.  He had an episode of what sounds like full-blown syncope following sexual intercourse a few weeks ago. He thinks he may have completely lost consciousness very briefly and woke up feeling very weak and extremely cold. When 911 emergency services arrived was found to have extremely frequent PVCs and nonsustained ventricular tachycardia on the monitor. This all happened in [place]. He was prescribed a beta blocker which we had avoided in the past due to symptomatic bradycardia.   Consent: The risks, benefits, and details of the procedure were explained to the patient. Risks including death, MI, stroke, bleeding, limb ischemia, renal failure and allergy were described and accepted by the patient. Informed written consent was obtained prior to proceeding.  Technique: The patient was brought to the cardiac catheterization laboratory in the fasting state. He was prepped and draped in the usual sterile fashion. Local anesthesia with 1% lidocaine was administered to the right groin area. Using the modified Seldinger technique a 5 French right common femoral artery sheath was introduced without difficulty. Under fluoroscopic guidance, using 5 French JL4, Williams R, JR (for LIMA), RCB (for both SVGs) and angled pigtail  catheters, selective cannulation of the left coronary artery, right coronary artery, bypasses and left ventricle were respectively performed. Several coronary angiograms in a variety of projections were recorded, as well as a left ventriculogram in the RAO projection. Left ventricular pressure and a pull back to the aorta were recorded. No immediate complications occurred. At the end of the procedure, all catheters were removed. After the procedure, hemostasis will be achieved with manual pressure.  Contrast used: 95 mL Omnipaque  Angiographic Findings:  1. The left main coronary artery is free of significant stenoses, but has extensive atherosclerosis and calcifications. It trifurcates into the left anterior descending artery, ramus intermedius (small to medium size) and left circumflex coronary artery.  2. The left anterior descending artery is a large vessel that reaches the apex and generates two major diagonal branches. There is evidence of severe stenosis and calcification over the entire proximal third of the vessel. The severity of stenosis ranges from 60-90%. Beyond the first septal and first diagonal arteries flow comes primarily from the mammary artery bypass. The mid and distal vessel are visualized during the mammary artery bypass injection. There is mild diffuse atherosclerosis without flow limiting stenoses. 3. The left circumflex coronary artery is occluded beyond the AV groove branch. The major oblique marginal artery fills via a patent saphenous vein graft.  The major oblique marginal artery is seen during bypass graft injection and is a bifurcating medium size vessel. There is little retrograde flow to a tiny more proximal oblique marginal vessel. 4. The right coronary artery is a large-size dominant vessel that is totally occluded beyond the mid vessel and RV marginal branch. The distal portion of the vessel is seen during injection of  the saphenous vein graft . It generates a large  trifurcating posterior lateral ventricular system as well as the PDA. There is evidence of mild luminal irregularities and mild calcification. No hemodynamically meaningful stenoses are seen.  5. The saphenous vein graft bypass to the oblique marginal artery is a widely patent and healthy appearing vessel with minimal luminal irregularities. 6. the saphenous vein graft bypass to the right coronary artery connects at the level of the crux. It is a large and healthy appearing vessel with minimal luminal irregularities especially in its proximal portion  7 The left ventricle is normal in size. The left ventricle systolic function is normal with an estimated ejection fraction of 60 %. Regional wall motion abnormalities are not seen. No left ventricular thrombus is seen. There is no mitral insufficiency. The ascending aorta appears normal. There is no aortic valve stenosis by pullback. The left ventricular end-diastolic pressure is 17 mm Hg.    IMPRESSIONS:  Severe native coronary artery disease, graft dependent. Widely patent bypass grafts. Normal left ventricular systolic function  RECOMMENDATION:  Continue beta blocker and amiodarone therapy. Consider implantable loop recorder for syncope     Sanda Klein, MD, [facility] [phone] office [phone] pager

## 2013-07-02 NOTE — H&P (View-Only) (Signed)
Patient ID: Michael Bryant, male   DOB: 05/21/1933, 79 y.o.   MRN: [id number]      Reason for office visit Followup event monitor, syncope  Michael Bryant is now roughly 12 years status post multivessel bypass surgery (this was prompted by workup after a witnessed ventricular fibrillation arrest in the setting of acute LAD infarction, not associated with chest pain or other anginal equivalents) and has preserved left ventricle systolic function.  He had an episode of what sounds like full-blown syncope following sexual intercourse a few weeks ago. He thinks he may have completely lost consciousness very briefly and woke up feeling very weak and extremely cold. When 911 emergency services arrived was found to have extremely frequent PVCs and nonsustained ventricular tachycardia on the monitor. This all happened in [place]. He was prescribed a beta blocker which we had avoided in the past due to symptomatic bradycardia. He had a normal stress test just 3 months ago. The study described "minimal attenuation involving the inferior and to a lesser extent the anterior walls of the left ventricle without associated wall motion abnormalities" and an ejection fraction of 60%. There was no evidence of reversible ischemia.  He has worn an event monitor that recorded only one isolated 4 beat run of nonsustained ventricular tachycardia that he was unaware of. He has had occasional mild bradycardia but did have one 2.2 second pause that occurred around 7:30 in the morning when he was convinced that he was awake. He does not remember the exact date but he does remember having to go back to lie in bed when he felt a little tired one morning while wearing the monitor   Allergies  Allergen Reactions  . Codeine Other (See Comments)    "wont go to sleep"    Current Outpatient Prescriptions  Medication Sig Dispense Refill  . amiodarone (PACERONE) 200 MG tablet Take 200 mg by mouth daily.       . aspirin EC 81 MG tablet  Take 81 mg by mouth daily.      . beta carotene w/minerals (OCUVITE) tablet Take 1 tablet by mouth daily.      . CINNAMON PO Take 1 capsule by mouth at bedtime.      . fish oil-omega-3 fatty acids 1000 MG capsule Take 1 g by mouth at bedtime.       . Glucosamine HCl (GLUCOSAMINE PO) Take 1 tablet by mouth 2 (two) times daily.      . nitroGLYCERIN (NITROSTAT) 0.3 MG SL tablet Place 0.3 mg under the tongue every 5 (five) minutes as needed for chest pain.      . omeprazole (PRILOSEC) 10 MG capsule Take 10 mg by mouth daily.      . Polyethyl Glycol-Propyl Glycol (SYSTANE OP) Place 1 drop into both eyes daily as needed (dry eyes).      . Saw Palmetto, Serenoa repens, (SAW PALMETTO PO) Take 1 tablet by mouth daily.      . simvastatin (ZOCOR) 40 MG tablet Take 40 mg by mouth at bedtime.      . tamsulosin (FLOMAX) 0.4 MG CAPS capsule Take 0.4 mg by mouth daily after supper.        No current facility-administered medications for this visit.    Past Medical History  Diagnosis Date  . MI (myocardial infarction)   . CAD (coronary artery disease)   . Hyperlipidemia   . NSVT (nonsustained ventricular tachycardia) 06/16/2013    Past Surgical History  Procedure Laterality Date  .   Coronary artery bypass graft  2004    LIMA to LAD, SVG to OM, SVG to RCA  . Tonsillectomy and adenoidectomy    . Cardiac catheterization  12/15/2001    60% proximal LAD narrowing with active mobile thrombosis w/50% sequential areas,50-60% pro  . Nm myocar perf wall motion  12/09/2009    mod. perfusion defect basal inferior and mid inferior regions c/w  infarct/scar, EF 54%    Family History  Problem Relation Age of Onset  . CAD Father 15    Died age 59  . Cancer Mother 32    History   Social History  . Marital Status: Married    Spouse Name: N/A    Number of Children: 2  . Years of Education: N/A   Occupational History  . Retired    Social History Main Topics  . Smoking status: Former Smoker    Types:  Cigarettes  . Smokeless tobacco: Not on file     Comment: Quit 1993  . Alcohol Use: No  . Drug Use: No  . Sexual Activity: Not on file   Other Topics Concern  . Not on file   Social History Narrative   Lives at home with wife.           Review of systems: The patient specifically denies any chest pain at rest or with exertion, dyspnea at rest or with exertion, orthopnea, paroxysmal nocturnal dyspnea, syncope, palpitations, focal neurological deficits, intermittent claudication, lower extremity edema, unexplained weight gain, cough, hemoptysis or wheezing.  The patient also denies abdominal pain, nausea, vomiting, dysphagia, diarrhea, constipation, polyuria, polydipsia, dysuria, hematuria, frequency, urgency, abnormal bleeding or bruising, fever, chills, unexpected weight changes, mood swings, change in skin or hair texture, change in voice quality, auditory or visual problems, allergic reactions or rashes, new musculoskeletal complaints other than usual "aches and pains".   PHYSICAL EXAM BP 142/88  Pulse 52  Ht 5\' 10"  (1.778 m)  Wt 191 lb 8 oz (86.864 kg)  BMI 27.48 kg/m2  General: Alert, oriented x3, no distress Head: no evidence of trauma, PERRL, EOMI, no exophtalmos or lid lag, no myxedema, no xanthelasma; normal ears, nose and oropharynx Neck: normal jugular venous pulsations and no hepatojugular reflux; brisk carotid pulses without delay and no carotid bruits Chest: clear to auscultation, no signs of consolidation by percussion or palpation, normal fremitus, symmetrical and full respiratory excursions; healed sternotomy Cardiovascular: normal position and quality of the apical impulse, regular rhythm, normal first and second heart sounds, no murmurs, rubs or gallops Abdomen: no tenderness or distention, no masses by palpation, no abnormal pulsatility or arterial bruits, normal bowel sounds, no hepatosplenomegaly Extremities: no clubbing, cyanosis or edema; 2+ radial, ulnar and  brachial pulses bilaterally; 2+ right femoral, posterior tibial and dorsalis pedis pulses; 2+ left femoral, posterior tibial and dorsalis pedis pulses; no subclavian or femoral bruits Neurological: grossly nonfocal   EKG: Sinus bradycardia  Lipid Panel     Component Value Date/Time   CHOL 122 03/25/2013 0312   TRIG 104 03/25/2013 0312   HDL 52 03/25/2013 0312   CHOLHDL 2.3 03/25/2013 0312   VLDL 21 03/25/2013 0312   LDLCALC 49 03/25/2013 0312    BMET    Component Value Date/Time   NA 140 06/30/2013 1653   K 4.3 06/30/2013 1653   CL 103 06/30/2013 1653   CO2 27 06/30/2013 1653   GLUCOSE 113* 06/30/2013 1653   BUN 22 06/30/2013 1653   CREATININE 0.96 06/30/2013 1653   CREATININE  0.83 03/25/2013 0312   CALCIUM 8.6 06/30/2013 1653   GFRNONAA 83* 03/25/2013 0312   GFRAA >90 03/25/2013 0312     ASSESSMENT AND PLAN  Notwithstanding the normal recent nuclear stress test, I am concerned that Michael Bryant has ischemia-related to ventricular tachyarrhythmia as a sign of new coronary insufficiency. I think we should followup for definitive diagnosis with a cardiac catheterization. He has never had angina with previous episodes of coronary insufficiency and ventricular tachycardia has been his way of manifesting coronary disease in the past.  We discussed the risks and benefits of diagnostic catheterization as well as possible angioplasty and stenting in detail. He understands and agrees to proceed.  For now he is on low-dose amiodarone. She was unable to tolerate the high loading dose that was prescribed in [place] and reduce it to 200 mg once a day. Depending on the results of his cardiac catheterization and we may decide to refer him for electrophysiology consult.  Orders Placed This Encounter  Procedures  . CBC  . Basic metabolic panel  . Protime-INR  . APTT  . TSH  . EKG 12-Lead  . LEFT HEART CATHETERIZATION WITH CORONARY/GRAFT ANGIOGRAM     Marlene Pfluger  Ezri Fanguy, MD,  FACC [facility] [phone] office [phone] pager   

## 2013-07-02 NOTE — Progress Notes (Signed)
Cath Lab Visit (complete for each Cath Lab visit)  Clinical Evaluation Leading to the Procedure:   ACS: no  Non-ACS:    Anginal Classification: CCS II  Anti-ischemic medical therapy: Minimal Therapy (1 class of medications)  Non-Invasive Test Results: Low-risk stress test findings: cardiac mortality <1%/year  Prior CABG: Previous CABG

## 2013-07-10 NOTE — Telephone Encounter (Signed)
Encounter Closed 07/10/13 TP

## 2013-07-15 ENCOUNTER — Other Ambulatory Visit: Payer: Self-pay | Admitting: *Deleted

## 2013-07-15 DIAGNOSIS — I2510 Atherosclerotic heart disease of native coronary artery without angina pectoris: Secondary | ICD-10-CM

## 2013-07-15 DIAGNOSIS — I252 Old myocardial infarction: Secondary | ICD-10-CM

## 2013-07-15 DIAGNOSIS — I4729 Other ventricular tachycardia: Secondary | ICD-10-CM

## 2013-07-15 DIAGNOSIS — I472 Ventricular tachycardia: Secondary | ICD-10-CM

## 2013-07-30 ENCOUNTER — Encounter: Payer: Self-pay | Admitting: Cardiovascular Disease

## 2013-07-31 ENCOUNTER — Encounter: Payer: Self-pay | Admitting: Cardiovascular Disease

## 2013-07-31 ENCOUNTER — Ambulatory Visit (INDEPENDENT_AMBULATORY_CARE_PROVIDER_SITE_OTHER): Payer: Medicare Other | Admitting: Cardiovascular Disease

## 2013-07-31 VITALS — BP 118/72 | HR 55 | Resp 16 | Ht 70.5 in | Wt 195.7 lb

## 2013-07-31 DIAGNOSIS — R55 Syncope and collapse: Secondary | ICD-10-CM | POA: Diagnosis not present

## 2013-07-31 DIAGNOSIS — I2510 Atherosclerotic heart disease of native coronary artery without angina pectoris: Secondary | ICD-10-CM | POA: Diagnosis not present

## 2013-07-31 DIAGNOSIS — I472 Ventricular tachycardia: Secondary | ICD-10-CM

## 2013-07-31 DIAGNOSIS — I4729 Other ventricular tachycardia: Secondary | ICD-10-CM

## 2013-07-31 NOTE — Assessment & Plan Note (Signed)
No clear-cut rhythm abnormality has been identified as the cause for this. He has features that suggest risk for both bradycardia and ventricular tachycardia. Have recommended that we place an implantable loop recorder.This procedure has been fully reviewed with the patient and written informed consent has been obtained.

## 2013-07-31 NOTE — Progress Notes (Signed)
Patient ID: Michael Bryant, male   DOB: 08/04/1933, 79 y.o.   MRN: [id number]      Reason for office visit Followup after cardiac catheterization; history of ventricular tachycardia, CAD status post CABG, syncope  Michael Bryant is now roughly 12 years status post multivessel bypass surgery (this was prompted by workup after a witnessed ventricular fibrillation arrest in the setting of acute LAD infarction, not associated with chest pain or other anginal equivalents) and has preserved left ventricle systolic function.  He had an episode of what sounds like full-blown syncope following sexual intercourse a few weeks ago. He wore an event monitor that showed 1 very short 4 beat run nonsustained VT and one 2.2 second pause, both asymptomatic. The seems to be tolerating the beta blocker so far, but this had to be stopped in the past for symptomatic bradycardia. He has not had any syncope since his last visit. He has returned to full physical activity. He has been busy taking care of his 13 honey bee hives His cardiac catheterization showed severe occlusive disease of the native coronary arteries, but patent bypass grafts. He is graft dependent.     Allergies  Allergen Reactions  . Codeine Other (See Comments)    "wont go to sleep"    Current Outpatient Prescriptions  Medication Sig Dispense Refill  . amiodarone (PACERONE) 200 MG tablet Take 200 mg by mouth daily.       . aspirin EC 81 MG tablet Take 81 mg by mouth daily.      . azelastine (ASTELIN) 0.1 % nasal spray as needed.      . beta carotene w/minerals (OCUVITE) tablet Take 1 tablet by mouth daily.      . CINNAMON PO Take 1 capsule by mouth at bedtime.      . fish oil-omega-3 fatty acids 1000 MG capsule Take 1 g by mouth at bedtime.       . Glucosamine HCl (GLUCOSAMINE PO) Take 1 tablet by mouth 2 (two) times daily.      . nitroGLYCERIN (NITROSTAT) 0.3 MG SL tablet Place 0.3 mg under the tongue every 5 (five) minutes as needed for chest pain.       . omeprazole (PRILOSEC) 10 MG capsule Take 10 mg by mouth daily.      . Polyethyl Glycol-Propyl Glycol (SYSTANE OP) Place 1 drop into both eyes daily as needed (dry eyes).      . Saw Palmetto, Serenoa repens, (SAW PALMETTO PO) Take 1 tablet by mouth daily.      . simvastatin (ZOCOR) 40 MG tablet Take 40 mg by mouth at bedtime.      . tamsulosin (FLOMAX) 0.4 MG CAPS capsule Take 0.4 mg by mouth daily after supper.        No current facility-administered medications for this visit.    Past Medical History  Diagnosis Date  . MI (myocardial infarction)   . CAD (coronary artery disease)   . Hyperlipidemia   . NSVT (nonsustained ventricular tachycardia) 06/16/2013    Past Surgical History  Procedure Laterality Date  . Coronary artery bypass graft  2004    LIMA to LAD, SVG to OM, SVG to RCA  . Tonsillectomy and adenoidectomy    . Cardiac catheterization  12/15/2001    60% proximal LAD narrowing with active mobile thrombosis w/50% sequential areas,50-60% pro  . Nm myocar perf wall motion  12/09/2009    mod. perfusion defect basal inferior and mid inferior regions c/w  infarct/scar, EF 54%      Family History  Problem Relation Age of Onset  . CAD Father 40    Died age 79  . Cancer Mother 67    History   Social History  . Marital Status: Married    Spouse Name: N/A    Number of Children: 2  . Years of Education: N/A   Occupational History  . Retired    Social History Main Topics  . Smoking status: Former Smoker    Types: Cigarettes  . Smokeless tobacco: Not on file     Comment: Quit 1993  . Alcohol Use: No  . Drug Use: No  . Sexual Activity: Not on file   Other Topics Concern  . Not on file   Social History Narrative   Lives at home with wife.           Review of systems: The patient specifically denies any chest pain at rest or with exertion, dyspnea at rest or with exertion, orthopnea, paroxysmal nocturnal dyspnea, syncope, palpitations, focal neurological  deficits, intermittent claudication, lower extremity edema, unexplained weight gain, cough, hemoptysis or wheezing.  The patient also denies abdominal pain, nausea, vomiting, dysphagia, diarrhea, constipation, polyuria, polydipsia, dysuria, hematuria, frequency, urgency, abnormal bleeding or bruising, fever, chills, unexpected weight changes, mood swings, change in skin or hair texture, change in voice quality, auditory or visual problems, allergic reactions or rashes, new musculoskeletal complaints other than usual "aches and pains".   PHYSICAL EXAM BP 118/72  Pulse 55  Resp 16  Ht 5' 10.5" (1.791 m)  Wt 195 lb 11.2 oz (88.769 kg)  BMI 27.67 kg/m2 General: Alert, oriented x3, no distress  Head: no evidence of trauma, PERRL, EOMI, no exophtalmos or lid lag, no myxedema, no xanthelasma; normal ears, nose and oropharynx  Neck: normal jugular venous pulsations and no hepatojugular reflux; brisk carotid pulses without delay and no carotid bruits  Chest: clear to auscultation, no signs of consolidation by percussion or palpation, normal fremitus, symmetrical and full respiratory excursions; healed sternotomy  Cardiovascular: normal position and quality of the apical impulse, regular rhythm, normal first and second heart sounds, no murmurs, rubs or gallops  Abdomen: no tenderness or distention, no masses by palpation, no abnormal pulsatility or arterial bruits, normal bowel sounds, no hepatosplenomegaly  Extremities: no clubbing, cyanosis or edema; 2+ radial, ulnar and brachial pulses bilaterally; 2+ right femoral, posterior tibial and dorsalis pedis pulses; 2+ left femoral, posterior tibial and dorsalis pedis pulses; no subclavian or femoral bruits  Neurological: grossly nonfocal   EKG: Sinus bradycardia   Lipid Panel     Component Value Date/Time   CHOL 122 03/25/2013 0312   TRIG 104 03/25/2013 0312   HDL 52 03/25/2013 0312   CHOLHDL 2.3 03/25/2013 0312   VLDL 21 03/25/2013 0312   LDLCALC 49  03/25/2013 0312    BMET    Component Value Date/Time   NA 140 06/30/2013 1653   K 4.3 06/30/2013 1653   CL 103 06/30/2013 1653   CO2 27 06/30/2013 1653   GLUCOSE 113* 06/30/2013 1653   BUN 22 06/30/2013 1653   CREATININE 0.96 06/30/2013 1653   CREATININE 0.83 03/25/2013 0312   CALCIUM 8.6 06/30/2013 1653   GFRNONAA 83* 03/25/2013 0312   GFRAA >90 03/25/2013 0312     ASSESSMENT AND PLAN Syncope No clear-cut rhythm abnormality has been identified as the cause for this. He has features that suggest risk for both bradycardia and ventricular tachycardia. Have recommended that we place an implantable loop recorder.This procedure has been   fully reviewed with the patient and written informed consent has been obtained.  CAD (coronary artery disease), hx CABG 2003 Severe native CAD, but patent bypass grafts. Graft dependent. No symptoms of angina pectoris. Preserved left ventricular systolic function.    Orders Placed This Encounter  Procedures  . EKG 12-Lead  . LOOP RECORDER IMPLANT   Meds ordered this encounter  Medications  . azelastine (ASTELIN) 0.1 % nasal spray    Sig: as needed.    Nicholus Chandran  Nimo Verastegui, MD, FACC [facility] [phone] office [phone] pager   

## 2013-07-31 NOTE — Assessment & Plan Note (Signed)
Severe native CAD, but patent bypass grafts. Graft dependent. No symptoms of angina pectoris. Preserved left ventricular systolic function.

## 2013-07-31 NOTE — Patient Instructions (Signed)
Dr. Sallyanne Kuster recommends you have a Loop Recorder implanted.  This is done as an outpatient at [facility].

## 2013-08-05 ENCOUNTER — Other Ambulatory Visit: Payer: Self-pay | Admitting: *Deleted

## 2013-08-07 ENCOUNTER — Encounter (HOSPITAL_COMMUNITY): Payer: Self-pay | Admitting: Pharmacy Technician

## 2013-08-18 ENCOUNTER — Ambulatory Visit (HOSPITAL_COMMUNITY)
Admission: RE | Admit: 2013-08-18 | Discharge: 2013-08-18 | Disposition: A | Discharge status: Home / Self Care | Payer: Medicare Other | Source: Ambulatory Visit | Attending: Cardiovascular Disease | Admitting: Cardiovascular Disease

## 2013-08-18 ENCOUNTER — Encounter (HOSPITAL_COMMUNITY)
Admission: RE | Disposition: A | Discharge status: Home / Self Care | Payer: Medicare Other | Source: Ambulatory Visit | Attending: Cardiovascular Disease

## 2013-08-18 DIAGNOSIS — E785 Hyperlipidemia, unspecified: Secondary | ICD-10-CM | POA: Insufficient documentation

## 2013-08-18 DIAGNOSIS — R55 Syncope and collapse: Secondary | ICD-10-CM | POA: Insufficient documentation

## 2013-08-18 DIAGNOSIS — Z7982 Long term (current) use of aspirin: Secondary | ICD-10-CM | POA: Diagnosis not present

## 2013-08-18 DIAGNOSIS — I252 Old myocardial infarction: Secondary | ICD-10-CM | POA: Insufficient documentation

## 2013-08-18 DIAGNOSIS — I4729 Other ventricular tachycardia: Secondary | ICD-10-CM | POA: Insufficient documentation

## 2013-08-18 DIAGNOSIS — I2510 Atherosclerotic heart disease of native coronary artery without angina pectoris: Secondary | ICD-10-CM | POA: Diagnosis not present

## 2013-08-18 DIAGNOSIS — Z87891 Personal history of nicotine dependence: Secondary | ICD-10-CM | POA: Diagnosis not present

## 2013-08-18 DIAGNOSIS — Z951 Presence of aortocoronary bypass graft: Secondary | ICD-10-CM | POA: Diagnosis not present

## 2013-08-18 DIAGNOSIS — I472 Ventricular tachycardia, unspecified: Secondary | ICD-10-CM | POA: Insufficient documentation

## 2013-08-18 SURGERY — LOOP RECORDER IMPLANT
Anesthesia: LOCAL

## 2013-08-18 NOTE — Op Note (Signed)
LOOP RECORDER IMPLANT   Procedure performed:  1. Loop recorder implantation   Reason for procedure:  1. Syncope 2. History of ventricular tachyarrhythmia Procedure performed by:  Sanda Klein, MD  Complications:  None  Estimated blood loss:  <5 mL  Medications administered during procedure:  20 mL 1% lidocaine with 1/10000 epinephrine Device details:  Medtronic Reveal Linq model number [id number], serial number [id number] Procedure details:  After the risks and benefits of the procedure were discussed the patient provided informed consent. He was brought to the cardiac catheter lab in the fasting state. The patient was prepped and draped in usual sterile fashion. Local anesthesia with 1% lidocaine was administered to an area 2 cm to the left of the sternum in the 4th intercostal space. A horizontal incision was made using the incision tool. The introducer was then used to create a subcutaneous tunnel and carefully deploy the device. Local pressure was held to ensure hemostasis.  The incision was closed with SteriStrips and a sterile dressing was applied.   Sanda Klein, MD, [place] [phone] office [phone] pager 08/18/2013 2:03 PM

## 2013-08-18 NOTE — Interval H&P Note (Signed)
History and Physical Interval Note:  08/18/2013 1:33 PM  Michael Bryant  has presented today for surgery, with the diagnosis of syncope  The various methods of treatment have been discussed with the patient and family. After consideration of risks, benefits and other options for treatment, the patient has consented to  Procedure(s): LOOP RECORDER IMPLANT (N/A) as a surgical intervention .  The patient's history has been reviewed, patient examined, no change in status, stable for surgery.  I have reviewed the patient's chart and labs.  Questions were answered to the patient's satisfaction.     Fredy Gladu

## 2013-08-18 NOTE — H&P (View-Only) (Signed)
Patient ID: Michael Bryant, male   DOB: 06/17/1933, 80 y.o.   MRN: [id number]      Reason for office visit Followup after cardiac catheterization; history of ventricular tachycardia, CAD status post CABG, syncope  Michael Bryant is now roughly 12 years status post multivessel bypass surgery (this was prompted by workup after a witnessed ventricular fibrillation arrest in the setting of acute LAD infarction, not associated with chest pain or other anginal equivalents) and has preserved left ventricle systolic function.  He had an episode of what sounds like full-blown syncope following sexual intercourse a few weeks ago. He wore an event monitor that showed 1 very short 4 beat run nonsustained VT and one 2.2 second pause, both asymptomatic. The seems to be tolerating the beta blocker so far, but this had to be stopped in the past for symptomatic bradycardia. He has not had any syncope since his last visit. He has returned to full physical activity. He has been busy taking care of his 13 honey bee hives His cardiac catheterization showed severe occlusive disease of the native coronary arteries, but patent bypass grafts. He is graft dependent.     Allergies  Allergen Reactions  . Codeine Other (See Comments)    "wont go to sleep"    Current Outpatient Prescriptions  Medication Sig Dispense Refill  . amiodarone (PACERONE) 200 MG tablet Take 200 mg by mouth daily.       Marland Kitchen aspirin EC 81 MG tablet Take 81 mg by mouth daily.      Marland Kitchen azelastine (ASTELIN) 0.1 % nasal spray as needed.      . beta carotene w/minerals (OCUVITE) tablet Take 1 tablet by mouth daily.      Marland Kitchen CINNAMON PO Take 1 capsule by mouth at bedtime.      . fish oil-omega-3 fatty acids 1000 MG capsule Take 1 g by mouth at bedtime.       . Glucosamine HCl (GLUCOSAMINE PO) Take 1 tablet by mouth 2 (two) times daily.      . nitroGLYCERIN (NITROSTAT) 0.3 MG SL tablet Place 0.3 mg under the tongue every 5 (five) minutes as needed for chest pain.       Marland Kitchen omeprazole (PRILOSEC) 10 MG capsule Take 10 mg by mouth daily.      Michael Bryant Glycol-Propyl Glycol (SYSTANE OP) Place 1 drop into both eyes daily as needed (dry eyes).      . Saw Palmetto, Serenoa repens, (SAW PALMETTO PO) Take 1 tablet by mouth daily.      . simvastatin (ZOCOR) 40 MG tablet Take 40 mg by mouth at bedtime.      . tamsulosin (FLOMAX) 0.4 MG CAPS capsule Take 0.4 mg by mouth daily after supper.        No current facility-administered medications for this visit.    Past Medical History  Diagnosis Date  . MI (myocardial infarction)   . CAD (coronary artery disease)   . Hyperlipidemia   . NSVT (nonsustained ventricular tachycardia) 06/16/2013    Past Surgical History  Procedure Laterality Date  . Coronary artery bypass graft  2004    LIMA to LAD, SVG to OM, SVG to RCA  . Tonsillectomy and adenoidectomy    . Cardiac catheterization  12/15/2001    60% proximal LAD narrowing with active mobile thrombosis w/50% sequential areas,50-60% pro  . Nm myocar perf wall motion  12/09/2009    mod. perfusion defect basal inferior and mid inferior regions c/w  infarct/scar, EF 54%  Family History  Problem Relation Age of Onset  . CAD Father 23    Died age 2  . Cancer Mother 68    History   Social History  . Marital Status: Married    Spouse Name: N/A    Number of Children: 2  . Years of Education: N/A   Occupational History  . Retired    Social History Main Topics  . Smoking status: Former Smoker    Types: Cigarettes  . Smokeless tobacco: Not on file     Comment: Quit 1993  . Alcohol Use: No  . Drug Use: No  . Sexual Activity: Not on file   Other Topics Concern  . Not on file   Social History Narrative   Lives at home with wife.           Review of systems: The patient specifically denies any chest pain at rest or with exertion, dyspnea at rest or with exertion, orthopnea, paroxysmal nocturnal dyspnea, syncope, palpitations, focal neurological  deficits, intermittent claudication, lower extremity edema, unexplained weight gain, cough, hemoptysis or wheezing.  The patient also denies abdominal pain, nausea, vomiting, dysphagia, diarrhea, constipation, polyuria, polydipsia, dysuria, hematuria, frequency, urgency, abnormal bleeding or bruising, fever, chills, unexpected weight changes, mood swings, change in skin or hair texture, change in voice quality, auditory or visual problems, allergic reactions or rashes, new musculoskeletal complaints other than usual "aches and pains".   PHYSICAL EXAM BP 118/72  Pulse 55  Resp 16  Ht 5' 10.5" (1.791 m)  Wt 195 lb 11.2 oz (88.769 kg)  BMI 27.67 kg/m2 General: Alert, oriented x3, no distress  Head: no evidence of trauma, PERRL, EOMI, no exophtalmos or lid lag, no myxedema, no xanthelasma; normal ears, nose and oropharynx  Neck: normal jugular venous pulsations and no hepatojugular reflux; brisk carotid pulses without delay and no carotid bruits  Chest: clear to auscultation, no signs of consolidation by percussion or palpation, normal fremitus, symmetrical and full respiratory excursions; healed sternotomy  Cardiovascular: normal position and quality of the apical impulse, regular rhythm, normal first and second heart sounds, no murmurs, rubs or gallops  Abdomen: no tenderness or distention, no masses by palpation, no abnormal pulsatility or arterial bruits, normal bowel sounds, no hepatosplenomegaly  Extremities: no clubbing, cyanosis or edema; 2+ radial, ulnar and brachial pulses bilaterally; 2+ right femoral, posterior tibial and dorsalis pedis pulses; 2+ left femoral, posterior tibial and dorsalis pedis pulses; no subclavian or femoral bruits  Neurological: grossly nonfocal   EKG: Sinus bradycardia   Lipid Panel     Component Value Date/Time   CHOL 122 03/25/2013 0312   TRIG 104 03/25/2013 0312   HDL 52 03/25/2013 0312   CHOLHDL 2.3 03/25/2013 0312   VLDL 21 03/25/2013 0312   LDLCALC 49  03/25/2013 0312    BMET    Component Value Date/Time   NA 140 06/30/2013 1653   K 4.3 06/30/2013 1653   CL 103 06/30/2013 1653   CO2 27 06/30/2013 1653   GLUCOSE 113* 06/30/2013 1653   BUN 22 06/30/2013 1653   CREATININE 0.96 06/30/2013 1653   CREATININE 0.83 03/25/2013 0312   CALCIUM 8.6 06/30/2013 1653   GFRNONAA 83* 03/25/2013 0312   GFRAA >90 03/25/2013 0312     ASSESSMENT AND PLAN Syncope No clear-cut rhythm abnormality has been identified as the cause for this. He has features that suggest risk for both bradycardia and ventricular tachycardia. Have recommended that we place an implantable loop recorder.This procedure has been  fully reviewed with the patient and written informed consent has been obtained.  CAD (coronary artery disease), hx CABG 2003 Severe native CAD, but patent bypass grafts. Graft dependent. No symptoms of angina pectoris. Preserved left ventricular systolic function.    Orders Placed This Encounter  Procedures  . EKG 12-Lead  . LOOP RECORDER IMPLANT   Meds ordered this encounter  Medications  . azelastine (ASTELIN) 0.1 % nasal spray    Sig: as needed.    Vedh Ptacek  Sanda Klein, MD, [facility] [phone] office [phone] pager

## 2013-08-20 MED FILL — Lidocaine Inj 1% w/ Epinephrine-1:100000: INTRAMUSCULAR | Qty: 30 | Status: AC

## 2013-08-25 ENCOUNTER — Ambulatory Visit: Payer: Medicare Other | Admitting: Cardiology

## 2013-08-27 ENCOUNTER — Ambulatory Visit (INDEPENDENT_AMBULATORY_CARE_PROVIDER_SITE_OTHER): Payer: Medicare Other | Admitting: Cardiology

## 2013-08-27 ENCOUNTER — Encounter: Payer: Self-pay | Admitting: Cardiology

## 2013-08-27 VITALS — BP 136/72 | HR 60 | Ht 70.5 in | Wt 192.0 lb

## 2013-08-27 DIAGNOSIS — R55 Syncope and collapse: Secondary | ICD-10-CM

## 2013-08-27 NOTE — Patient Instructions (Signed)
Your physician recommends that you schedule a follow-up appointment in: 3 months with Dr. Croitoru.  

## 2013-08-27 NOTE — Progress Notes (Signed)
Pt here for loop recorder site check. No problems since implant. Site looks good, I removed steri-strips. He will follow up with Dr Sallyanne Kuster in 3 months.   Kerin Ransom PA-C 08/27/2013 8:46 AM

## 2013-08-27 NOTE — Assessment & Plan Note (Signed)
S/P Loop recorder implant 6/9

## 2013-09-10 DIAGNOSIS — H353190 Nonexudative age-related macular degeneration, unspecified eye, stage unspecified: Secondary | ICD-10-CM | POA: Diagnosis not present

## 2013-10-02 ENCOUNTER — Other Ambulatory Visit: Payer: Self-pay | Admitting: Physician Assistant

## 2013-10-02 NOTE — Telephone Encounter (Signed)
Rx refill sent to patient pharmacy   

## 2013-10-06 DIAGNOSIS — I10 Essential (primary) hypertension: Secondary | ICD-10-CM | POA: Diagnosis not present

## 2013-10-06 DIAGNOSIS — R7309 Other abnormal glucose: Secondary | ICD-10-CM | POA: Diagnosis not present

## 2013-10-07 ENCOUNTER — Other Ambulatory Visit: Payer: Self-pay | Admitting: *Deleted

## 2013-10-14 ENCOUNTER — Encounter: Payer: Self-pay | Admitting: Cardiovascular Disease

## 2013-10-14 DIAGNOSIS — R7309 Other abnormal glucose: Secondary | ICD-10-CM | POA: Diagnosis not present

## 2013-10-14 DIAGNOSIS — I10 Essential (primary) hypertension: Secondary | ICD-10-CM | POA: Diagnosis not present

## 2013-10-14 DIAGNOSIS — N400 Enlarged prostate without lower urinary tract symptoms: Secondary | ICD-10-CM | POA: Diagnosis not present

## 2013-10-14 DIAGNOSIS — E7800 Pure hypercholesterolemia, unspecified: Secondary | ICD-10-CM | POA: Diagnosis not present

## 2013-10-26 ENCOUNTER — Encounter: Payer: Self-pay | Admitting: Podiatry

## 2013-10-26 ENCOUNTER — Ambulatory Visit (INDEPENDENT_AMBULATORY_CARE_PROVIDER_SITE_OTHER): Payer: Medicare Other | Admitting: Podiatry

## 2013-10-26 DIAGNOSIS — M79609 Pain in unspecified limb: Secondary | ICD-10-CM

## 2013-10-26 DIAGNOSIS — B351 Tinea unguium: Secondary | ICD-10-CM | POA: Diagnosis not present

## 2013-10-26 DIAGNOSIS — M79673 Pain in unspecified foot: Secondary | ICD-10-CM

## 2013-10-28 NOTE — Progress Notes (Signed)
Subjective:     Patient ID: Michael Bryant, male   DOB: 09/27/1933, 80 y.o.   MRN: [id number]  HPI patient's found to have thick yellow nailbeds that are painful 1-5 both feet   Review of Systems     Objective:   Physical Exam Neurovascular status intact with thick yellow brittle nailbeds 1-5 both feet    Assessment:     Mycotic nail infection is with pain 1-5 both feet    Plan:     Debris painful nailbeds 1-5 both feet with no iatrogenic bleeding noted      

## 2013-11-30 ENCOUNTER — Encounter: Payer: Self-pay | Admitting: Cardiovascular Disease

## 2013-11-30 ENCOUNTER — Ambulatory Visit (INDEPENDENT_AMBULATORY_CARE_PROVIDER_SITE_OTHER): Payer: Medicare Other | Admitting: Cardiovascular Disease

## 2013-11-30 VITALS — BP 118/70 | HR 62 | Resp 20 | Ht 70.0 in | Wt 193.8 lb

## 2013-11-30 DIAGNOSIS — I25810 Atherosclerosis of coronary artery bypass graft(s) without angina pectoris: Secondary | ICD-10-CM

## 2013-11-30 DIAGNOSIS — I10 Essential (primary) hypertension: Secondary | ICD-10-CM | POA: Diagnosis not present

## 2013-11-30 DIAGNOSIS — I252 Old myocardial infarction: Secondary | ICD-10-CM

## 2013-11-30 DIAGNOSIS — R55 Syncope and collapse: Secondary | ICD-10-CM | POA: Diagnosis not present

## 2013-11-30 DIAGNOSIS — I472 Ventricular tachycardia: Secondary | ICD-10-CM | POA: Diagnosis not present

## 2013-11-30 DIAGNOSIS — I2510 Atherosclerotic heart disease of native coronary artery without angina pectoris: Secondary | ICD-10-CM

## 2013-11-30 DIAGNOSIS — I4729 Other ventricular tachycardia: Secondary | ICD-10-CM | POA: Diagnosis not present

## 2013-11-30 NOTE — Progress Notes (Signed)
Patient ID: Michael Bryant, male   DOB: January 03, 1934, 80 y.o.   MRN: [id number]     Reason for office visit Syncope, CAD s/p CABG  Michael Bryant is now roughly 12 years status post multivessel bypass surgery (this was prompted by workup after a witnessed ventricular fibrillation arrest in the setting of acute LAD infarction, not associated with chest pain or other anginal equivalents) and has preserved left ventricle systolic function.  He had an episode of what sounds like full-blown syncope following sexual intercourse a few months ago. He wore an external event monitor that showed 1 very short 4 beat run nonsustained VT and one 2.2 second pause, both asymptomatic. He has not had any syncope since his last visit. An implantable loop recorder was placed in June. No arrhythmia has occurred since implantation. Interrogation of his loop recorder today shows no events. His cardiac catheterization showed severe occlusive disease of the native coronary arteries, but patent bypass grafts. He is graft dependent.     Allergies  Allergen Reactions  . Codeine Other (See Comments)    "wont go to sleep"    Current Outpatient Prescriptions  Medication Sig Dispense Refill  . amiodarone (PACERONE) 200 MG tablet Take 200 mg by mouth daily.       Marland Kitchen aspirin EC 81 MG tablet Take 81 mg by mouth daily.      Marland Kitchen azelastine (ASTELIN) 0.1 % nasal spray Place 1 spray into both nostrils daily as needed for rhinitis.       Marland Kitchen beta carotene w/minerals (OCUVITE) tablet Take 1 tablet by mouth daily.      Marland Kitchen CINNAMON PO Take 1,000 mg by mouth daily.       Marland Kitchen DICLOFENAC PO Take 1 tablet by mouth daily.      . fish oil-omega-3 fatty acids 1000 MG capsule Take 1 g by mouth at bedtime.       . Glucosamine HCl (GLUCOSAMINE PO) Take 1 tablet by mouth 2 (two) times daily.      . montelukast (SINGULAIR) 10 MG tablet Take 1 tablet by mouth daily.      . nitroGLYCERIN (NITROSTAT) 0.4 MG SL tablet Place 0.4 mg under the tongue every 5 (five)  minutes as needed for chest pain.      Marland Kitchen omeprazole (PRILOSEC) 10 MG capsule Take 10 mg by mouth daily.      [name] (SYSTANE OP) Place 1 drop into both eyes daily as needed (dry eyes).      . Saw Palmetto, Serenoa repens, (SAW PALMETTO PO) Take 1 tablet by mouth daily.      . simvastatin (ZOCOR) 40 MG tablet Take 40 mg by mouth at bedtime.      . tamsulosin (FLOMAX) 0.4 MG CAPS capsule Take 0.4 mg by mouth daily after supper.       . trandolapril (MAVIK) 2 MG tablet Take 1 tablet by mouth daily.      . trandolapril (MAVIK) 2 MG tablet TAKE ONE TABLET BY MOUTH ONCE DAILY  30 tablet  5   No current facility-administered medications for this visit.    Past Medical History  Diagnosis Date  . MI (myocardial infarction)   . CAD (coronary artery disease)   . Hyperlipidemia   . NSVT (nonsustained ventricular tachycardia) 06/16/2013    Past Surgical History  Procedure Laterality Date  . Coronary artery bypass graft  2004    LIMA to LAD, SVG to OM, SVG to RCA  . Tonsillectomy and  adenoidectomy    . Cardiac catheterization  12/15/2001    60% proximal LAD narrowing with active mobile thrombosis w/50% sequential areas,50-60% pro  . Nm myocar perf wall motion  12/09/2009    mod. perfusion defect basal inferior and mid inferior regions c/w  infarct/scar, EF 54%    Family History  Problem Relation Age of Onset  . CAD Father 51    Died age 79  . Cancer Mother 40    History   Social History  . Marital Status: Married    Spouse Name: N/A    Number of Children: 2  . Years of Education: N/A   Occupational History  . Retired    Social History Main Topics  . Smoking status: Former Smoker    Types: Cigarettes  . Smokeless tobacco: Not on file     Comment: Quit 1993  . Alcohol Use: No  . Drug Use: No  . Sexual Activity: Not on file   Other Topics Concern  . Not on file   Social History Narrative   Lives at home with wife.           Review of  systems: The patient specifically denies any chest pain at rest or with exertion, dyspnea at rest or with exertion, orthopnea, paroxysmal nocturnal dyspnea, syncope, palpitations, focal neurological deficits, intermittent claudication, lower extremity edema, unexplained weight gain, cough, hemoptysis or wheezing.  The patient also denies abdominal pain, nausea, vomiting, dysphagia, diarrhea, constipation, polyuria, polydipsia, dysuria, hematuria, frequency, urgency, abnormal bleeding or bruising, fever, chills, unexpected weight changes, mood swings, change in skin or hair texture, change in voice quality, auditory or visual problems, allergic reactions or rashes, new musculoskeletal complaints other than usual "aches and pains".   PHYSICAL EXAM BP 118/70  Pulse 62  Resp 20  Ht 5\' 10"  (1.778 m)  Wt 87.907 kg (193 lb 12.8 oz)  BMI 27.81 kg/m2  General: Alert, oriented x3, no distress Head: no evidence of trauma, PERRL, EOMI, no exophtalmos or lid lag, no myxedema, no xanthelasma; normal ears, nose and oropharynx Neck: normal jugular venous pulsations and no hepatojugular reflux; brisk carotid pulses without delay and no carotid bruits Chest: clear to auscultation, no signs of consolidation by percussion or palpation, normal fremitus, symmetrical and full respiratory excursions Cardiovascular: normal position and quality of the apical impulse, regular rhythm, normal first and second heart sounds, no murmurs, rubs or gallops Abdomen: no tenderness or distention, no masses by palpation, no abnormal pulsatility or arterial bruits, normal bowel sounds, no hepatosplenomegaly Extremities: no clubbing, cyanosis or edema; 2+ radial, ulnar and brachial pulses bilaterally; 2+ right femoral, posterior tibial and dorsalis pedis pulses; 2+ left femoral, posterior tibial and dorsalis pedis pulses; no subclavian or femoral bruits Neurological: grossly nonfocal   Lipid Panel     Component Value Date/Time    CHOL 122 03/25/2013 0312   TRIG 104 03/25/2013 0312   HDL 52 03/25/2013 0312   CHOLHDL 2.3 03/25/2013 0312   VLDL 21 03/25/2013 0312   LDLCALC 49 03/25/2013 0312    BMET    Component Value Date/Time   NA 140 06/30/2013 1653   K 4.3 06/30/2013 1653   CL 103 06/30/2013 1653   CO2 27 06/30/2013 1653   GLUCOSE 113* 06/30/2013 1653   BUN 22 06/30/2013 1653   CREATININE 0.96 06/30/2013 1653   CREATININE 0.83 03/25/2013 0312   CALCIUM 8.6 06/30/2013 1653   GFRNONAA 83* 03/25/2013 0312   GFRAA >90 03/25/2013 3419  ASSESSMENT AND PLAN   Syncope  No clear-cut rhythm abnormality has been identified as the cause for this. He has features that suggest risk for both bradycardia and ventricular tachycardia, by previous monitoring, but no events have been recorded since loop recorder implantation in June.   CAD (coronary artery disease), hx CABG 2003  Severe native CAD, but patent bypass grafts. Graft dependent. No symptoms of angina pectoris. Preserved left ventricular systolic function. Excellent recent lipid profile. Well-controlled hypertension.  Meds ordered this encounter  Medications  . montelukast (SINGULAIR) 10 MG tablet    Sig: Take 1 tablet by mouth daily.    Holli Humbles, MD, [facility] [phone] office [phone] pager

## 2013-11-30 NOTE — Patient Instructions (Addendum)
Your physician wants you to follow-up in: 1 year. You will receive a reminder letter in the mail two months in advance. If you don't receive a letter, please call our office to schedule the follow-up appointment.  

## 2013-12-11 ENCOUNTER — Ambulatory Visit (INDEPENDENT_AMBULATORY_CARE_PROVIDER_SITE_OTHER): Payer: Medicare Other | Admitting: *Deleted

## 2013-12-11 ENCOUNTER — Encounter: Payer: Self-pay | Admitting: Cardiovascular Disease

## 2013-12-11 DIAGNOSIS — R55 Syncope and collapse: Secondary | ICD-10-CM

## 2013-12-18 NOTE — Progress Notes (Signed)
Loop recorder 

## 2013-12-21 ENCOUNTER — Encounter: Payer: Self-pay | Admitting: Cardiovascular Disease

## 2013-12-25 LAB — MDC_IDC_ENUM_SESS_TYPE_REMOTE

## 2014-01-07 ENCOUNTER — Encounter: Payer: Self-pay | Admitting: Cardiovascular Disease

## 2014-01-11 ENCOUNTER — Ambulatory Visit (INDEPENDENT_AMBULATORY_CARE_PROVIDER_SITE_OTHER): Payer: Medicare Other | Admitting: *Deleted

## 2014-01-11 DIAGNOSIS — R55 Syncope and collapse: Secondary | ICD-10-CM

## 2014-01-13 NOTE — Progress Notes (Signed)
Loop recorder 

## 2014-01-24 LAB — MDC_IDC_ENUM_SESS_TYPE_REMOTE

## 2014-02-01 ENCOUNTER — Encounter: Payer: Self-pay | Admitting: Podiatry

## 2014-02-01 ENCOUNTER — Ambulatory Visit (INDEPENDENT_AMBULATORY_CARE_PROVIDER_SITE_OTHER): Payer: Medicare Other | Admitting: Podiatry

## 2014-02-01 DIAGNOSIS — M79673 Pain in unspecified foot: Secondary | ICD-10-CM

## 2014-02-01 DIAGNOSIS — B351 Tinea unguium: Secondary | ICD-10-CM | POA: Diagnosis not present

## 2014-02-01 NOTE — Progress Notes (Signed)
Subjective:     Patient ID: Daiton D Rasmussen, male   DOB: 08/30/1933, 80 y.o.   MRN: [id number]  HPI patient's found to have thick yellow nailbeds that are painful 1-5 both feet   Review of Systems     Objective:   Physical Exam Neurovascular status intact with thick yellow brittle nailbeds 1-5 both feet    Assessment:     Mycotic nail infection is with pain 1-5 both feet    Plan:     Debris painful nailbeds 1-5 both feet with no iatrogenic bleeding noted      

## 2014-02-09 DIAGNOSIS — R0989 Other specified symptoms and signs involving the circulatory and respiratory systems: Secondary | ICD-10-CM | POA: Diagnosis not present

## 2014-02-10 ENCOUNTER — Ambulatory Visit (INDEPENDENT_AMBULATORY_CARE_PROVIDER_SITE_OTHER): Payer: Medicare Other | Admitting: *Deleted

## 2014-02-10 DIAGNOSIS — R55 Syncope and collapse: Secondary | ICD-10-CM

## 2014-02-16 ENCOUNTER — Encounter: Payer: Self-pay | Admitting: Cardiovascular Disease

## 2014-02-17 NOTE — Progress Notes (Signed)
Loop recorder 

## 2014-02-18 ENCOUNTER — Encounter (HOSPITAL_COMMUNITY): Payer: Self-pay | Admitting: Cardiovascular Disease

## 2014-02-25 LAB — MDC_IDC_ENUM_SESS_TYPE_REMOTE

## 2014-03-11 ENCOUNTER — Ambulatory Visit (INDEPENDENT_AMBULATORY_CARE_PROVIDER_SITE_OTHER): Payer: Medicare Other | Admitting: *Deleted

## 2014-03-11 DIAGNOSIS — R55 Syncope and collapse: Secondary | ICD-10-CM

## 2014-03-11 LAB — MDC_IDC_ENUM_SESS_TYPE_REMOTE

## 2014-03-17 DIAGNOSIS — H43813 Vitreous degeneration, bilateral: Secondary | ICD-10-CM | POA: Diagnosis not present

## 2014-03-17 DIAGNOSIS — H3531 Nonexudative age-related macular degeneration: Secondary | ICD-10-CM | POA: Diagnosis not present

## 2014-03-17 DIAGNOSIS — H18003 Unspecified corneal deposit, bilateral: Secondary | ICD-10-CM | POA: Diagnosis not present

## 2014-03-19 NOTE — Progress Notes (Signed)
Loop recorder 

## 2014-03-24 ENCOUNTER — Encounter: Payer: Self-pay | Admitting: Cardiovascular Disease

## 2014-03-25 ENCOUNTER — Telehealth: Payer: Self-pay | Admitting: Cardiovascular Disease

## 2014-03-25 MED ORDER — NITROGLYCERIN 0.4 MG SL SUBL: 0.4000 mg | SUBLINGUAL_TABLET | SUBLINGUAL | Status: DC | PRN

## 2014-03-25 NOTE — Telephone Encounter (Signed)
Refill submitted to patient's preferred pharmacy. Informed patient. Pt voiced understanding, no other stated concerns at this time.  

## 2014-03-25 NOTE — Telephone Encounter (Signed)
Pt need a new prescription for nitroglycerin. Please call this to [phone].

## 2014-04-05 ENCOUNTER — Telehealth: Payer: Self-pay | Admitting: Cardiovascular Disease

## 2014-04-05 MED ORDER — TRANDOLAPRIL 2 MG PO TABS: 2.0000 mg | ORAL_TABLET | Freq: Every day | ORAL | Status: DC

## 2014-04-05 NOTE — Telephone Encounter (Signed)
Rx refill sent to patient pharmacy   

## 2014-04-05 NOTE — Telephone Encounter (Signed)
Need new prescription for his Trandolapril. He have changed pharmacy, Please call to [phone].

## 2014-04-12 ENCOUNTER — Ambulatory Visit (INDEPENDENT_AMBULATORY_CARE_PROVIDER_SITE_OTHER): Payer: Medicare Other | Admitting: *Deleted

## 2014-04-12 DIAGNOSIS — R55 Syncope and collapse: Secondary | ICD-10-CM | POA: Diagnosis not present

## 2014-04-12 LAB — MDC_IDC_ENUM_SESS_TYPE_REMOTE
Date Time Interrogation Session: 20160208211102
MDC IDC SET ZONE DETECTION INTERVAL: 2000 ms
MDC IDC SET ZONE DETECTION INTERVAL: 3000 ms
Zone Setting Detection Interval: 390 ms

## 2014-04-13 ENCOUNTER — Encounter: Payer: Self-pay | Admitting: Cardiovascular Disease

## 2014-04-13 NOTE — Progress Notes (Signed)
Loop recorder 

## 2014-04-16 DIAGNOSIS — E118 Type 2 diabetes mellitus with unspecified complications: Secondary | ICD-10-CM | POA: Diagnosis not present

## 2014-04-16 DIAGNOSIS — I10 Essential (primary) hypertension: Secondary | ICD-10-CM | POA: Diagnosis not present

## 2014-04-16 DIAGNOSIS — R7302 Impaired glucose tolerance (oral): Secondary | ICD-10-CM | POA: Diagnosis not present

## 2014-04-19 ENCOUNTER — Telehealth: Payer: Self-pay | Admitting: Cardiovascular Disease

## 2014-04-19 ENCOUNTER — Other Ambulatory Visit: Payer: Self-pay | Admitting: Physician Assistant

## 2014-04-19 ENCOUNTER — Emergency Department (HOSPITAL_COMMUNITY): Payer: Medicare Other

## 2014-04-19 ENCOUNTER — Encounter (HOSPITAL_COMMUNITY): Payer: Self-pay | Admitting: Family Medicine

## 2014-04-19 ENCOUNTER — Emergency Department (HOSPITAL_COMMUNITY)
Admission: EM | Admit: 2014-04-19 | Discharge: 2014-04-19 | Disposition: A | Discharge status: Home / Self Care | Payer: Medicare Other | Attending: Emergency Medicine | Admitting: Emergency Medicine

## 2014-04-19 DIAGNOSIS — Z9189 Other specified personal risk factors, not elsewhere classified: Secondary | ICD-10-CM | POA: Diagnosis not present

## 2014-04-19 DIAGNOSIS — R079 Chest pain, unspecified: Secondary | ICD-10-CM

## 2014-04-19 DIAGNOSIS — Z951 Presence of aortocoronary bypass graft: Secondary | ICD-10-CM | POA: Diagnosis not present

## 2014-04-19 DIAGNOSIS — I252 Old myocardial infarction: Secondary | ICD-10-CM | POA: Insufficient documentation

## 2014-04-19 DIAGNOSIS — Z95818 Presence of other cardiac implants and grafts: Secondary | ICD-10-CM | POA: Diagnosis not present

## 2014-04-19 DIAGNOSIS — R0789 Other chest pain: Secondary | ICD-10-CM | POA: Diagnosis not present

## 2014-04-19 DIAGNOSIS — E785 Hyperlipidemia, unspecified: Secondary | ICD-10-CM | POA: Insufficient documentation

## 2014-04-19 DIAGNOSIS — Z87891 Personal history of nicotine dependence: Secondary | ICD-10-CM | POA: Diagnosis not present

## 2014-04-19 DIAGNOSIS — Z79899 Other long term (current) drug therapy: Secondary | ICD-10-CM | POA: Diagnosis not present

## 2014-04-19 DIAGNOSIS — Z9889 Other specified postprocedural states: Secondary | ICD-10-CM | POA: Diagnosis not present

## 2014-04-19 DIAGNOSIS — Z7982 Long term (current) use of aspirin: Secondary | ICD-10-CM | POA: Insufficient documentation

## 2014-04-19 DIAGNOSIS — I2510 Atherosclerotic heart disease of native coronary artery without angina pectoris: Secondary | ICD-10-CM | POA: Diagnosis not present

## 2014-04-19 DIAGNOSIS — R072 Precordial pain: Secondary | ICD-10-CM

## 2014-04-19 DIAGNOSIS — J984 Other disorders of lung: Secondary | ICD-10-CM | POA: Diagnosis not present

## 2014-04-19 HISTORY — DX: Cardiac arrest, cause unspecified: I46.9

## 2014-04-19 LAB — CBC
HCT: 43.8 % (ref 39.0–52.0)
Hemoglobin: 14.8 g/dL (ref 13.0–17.0)
MCH: 31.6 pg (ref 26.0–34.0)
MCHC: 33.8 g/dL (ref 30.0–36.0)
MCV: 93.6 fL (ref 78.0–100.0)
PLATELETS: 183 10*3/uL (ref 150–400)
RBC: 4.68 MIL/uL (ref 4.22–5.81)
RDW: 13.8 % (ref 11.5–15.5)
WBC: 5 10*3/uL (ref 4.0–10.5)

## 2014-04-19 LAB — BASIC METABOLIC PANEL
ANION GAP: 6 (ref 5–15)
BUN: 27 mg/dL — AB (ref 6–23)
CHLORIDE: 104 mmol/L (ref 96–112)
CO2: 30 mmol/L (ref 19–32)
Calcium: 8.9 mg/dL (ref 8.4–10.5)
Creatinine, Ser: 1.32 mg/dL (ref 0.50–1.35)
GFR calc non Af Amer: 50 mL/min — ABNORMAL LOW (ref >90)
GFR, EST AFRICAN AMERICAN: 58 mL/min — AB (ref >90)
Glucose, Bld: 120 mg/dL — ABNORMAL HIGH (ref 70–99)
Potassium: 4.6 mmol/L (ref 3.5–5.1)
SODIUM: 140 mmol/L (ref 135–145)

## 2014-04-19 LAB — BRAIN NATRIURETIC PEPTIDE: B Natriuretic Peptide: 45.1 pg/mL (ref 0.0–100.0)

## 2014-04-19 LAB — I-STAT TROPONIN, ED: TROPONIN I, POC: 0 ng/mL (ref 0.00–0.08)

## 2014-04-19 LAB — PROTIME-INR
INR: 1.05 (ref 0.00–1.49)
PROTHROMBIN TIME: 13.8 s (ref 11.6–15.2)

## 2014-04-19 NOTE — ED Notes (Signed)
Bowie PA at bedside 

## 2014-04-19 NOTE — ED Notes (Signed)
Loop recording report called in. No arrhythmia detected and HR consistently in the 50s. They stated they would fax a paper report for review as well.

## 2014-04-19 NOTE — ED Notes (Signed)
Agricultural consultant to Sport and exercise psychologist perCardiology request.

## 2014-04-19 NOTE — Telephone Encounter (Signed)
Pt's wife called in stating that the pt isn't feeling quite right and he would like to be seen as soon as possible. He stated that while he was driving a few moments ago, he got hot . Please call  Thanks ?

## 2014-04-19 NOTE — ED Provider Notes (Signed)
CSN: [id number]     Arrival date & time 04/19/14  1146 History   First MD Initiated Contact with Patient 04/19/14 1247     Chief Complaint  Patient presents with  . Chest Pain     (Consider location/radiation/quality/duration/timing/severity/associated sxs/prior Treatment) HPI  81 year old male with history of CAD status post CABG, history of atrial tachycardia, recent cardiac catheterization which showed severe occlusive disease with native coronary arteries but pain bypass graft. He is graft dependent. Patient is here for evaluation of chest pain. Patient reports he had a normal morning, random walk for 3-4 miles without any difficulty. Afterwards while he was driving his car he experiencing a mild chest discomfort. Patient correlate his discomfort with the seatbelt but he also reported having a flushing sensation throughout body lasting for a few seconds. Reports some mild nausea but that subsequently resolved. He did take one sublingual nitroglycerin which gave him a headache but now the headache has resolved. He also felt a tingling sensation to both of his arms lasting for an hr. He denies having any lightheadedness or dizziness, no shortness of breath, no productive cough. No abdominal pain or back pain, no severe headaches or change in vision. He recall eating some food that causes mild indigestion the day before. Patient also reported that he is currently wearing a [employer] device loop recorder for nearly a year.      Past Medical History  Diagnosis Date  . MI (myocardial infarction)   . CAD (coronary artery disease)   . Hyperlipidemia   . NSVT (nonsustained ventricular tachycardia) 06/16/2013  . Cardiac arrest 2003   Past Surgical History  Procedure Laterality Date  . Coronary artery bypass graft  2004    LIMA to LAD, SVG to OM, SVG to RCA  . Tonsillectomy and adenoidectomy    . Cardiac catheterization  12/15/2001    60% proximal LAD narrowing with active mobile thrombosis w/50%  sequential areas,50-60% pro  . Nm myocar perf wall motion  12/09/2009    mod. perfusion defect basal inferior and mid inferior regions c/w  infarct/scar, EF 54%  . Left heart catheterization with coronary/graft angiogram N/A 07/02/2013    Procedure: LEFT HEART CATHETERIZATION WITH Beatrix Fetters;  Surgeon: Sanda Klein, MD;  Location: [facility];  Service: Cardiovascular;  Laterality: N/A;   Family History  Problem Relation Age of Onset  . CAD Father 53    Died age 69  . Cancer Mother 61   History  Substance Use Topics  . Smoking status: Former Smoker    Types: Cigarettes  . Smokeless tobacco: Not on file     Comment: Quit 1993  . Alcohol Use: No    Review of Systems  All other systems reviewed and are negative.     Allergies  Codeine  Home Medications   Prior to Admission medications   Medication Sig Start Date End Date Taking? Authorizing Provider  amiodarone (PACERONE) 200 MG tablet Take 200 mg by mouth daily.  06/13/13   Historical Provider, MD  aspirin EC 81 MG tablet Take 81 mg by mouth daily.    Historical Provider, MD  azelastine (ASTELIN) 0.1 % nasal spray Place 1 spray into both nostrils daily as needed for rhinitis.  04/24/13   Historical Provider, MD  beta carotene w/minerals (OCUVITE) tablet Take 1 tablet by mouth daily.    Historical Provider, MD  CINNAMON PO Take 1,000 mg by mouth daily.     Historical Provider, MD  DICLOFENAC PO Take 1  tablet by mouth daily.    Historical Provider, MD  fish oil-omega-3 fatty acids 1000 MG capsule Take 1 g by mouth at bedtime.     Historical Provider, MD  Glucosamine HCl (GLUCOSAMINE PO) Take 1 tablet by mouth 2 (two) times daily.    Historical Provider, MD  montelukast (SINGULAIR) 10 MG tablet Take 1 tablet by mouth daily. 11/17/13   Historical Provider, MD  nitroGLYCERIN (NITROSTAT) 0.4 MG SL tablet Place 1 tablet (0.4 mg total) under the tongue every 5 (five) minutes as needed for chest pain. 03/25/14   Mihai  Croitoru, MD  omeprazole (PRILOSEC) 10 MG capsule Take 10 mg by mouth daily.    Historical Provider, MD  Polyethyl Glycol-Propyl Glycol (SYSTANE OP) Place 1 drop into both eyes daily as needed (dry eyes).    Historical Provider, MD  Saw Palmetto, Serenoa repens, (SAW PALMETTO PO) Take 1 tablet by mouth daily.    Historical Provider, MD  simvastatin (ZOCOR) 40 MG tablet Take 40 mg by mouth at bedtime.    Historical Provider, MD  tamsulosin (FLOMAX) 0.4 MG CAPS capsule Take 0.4 mg by mouth daily after supper.  03/02/13   Historical Provider, MD  trandolapril (MAVIK) 2 MG tablet Take 1 tablet by mouth daily. 07/31/13   Historical Provider, MD  trandolapril (MAVIK) 2 MG tablet Take 1 tablet (2 mg total) by mouth daily. 04/05/14   Mihai Croitoru, MD   BP 165/83 mmHg  Pulse 57  Temp(Src) 98 F (36.7 C)  Resp 18  SpO2 98% Physical Exam  Constitutional: He is oriented to person, place, and time. He appears well-developed and well-nourished. No distress.  HENT:  Head: Atraumatic.  Eyes: Conjunctivae are normal.  Neck: Normal range of motion. Neck supple.  Cardiovascular: Intact distal pulses.   Mild bradycardia without murmurs rubs or gallops  Pulmonary/Chest: Effort normal and breath sounds normal. He exhibits no tenderness.  Abdominal: Soft. There is no tenderness.  Musculoskeletal: He exhibits no edema.  5/5 strength all 4 extremities  Neurological: He is alert and oriented to person, place, and time.  Skin: No rash noted.  Psychiatric: He has a normal mood and affect.    ED Course  Procedures (including critical care time)  Patient with significant cardiac history who is here for evaluation of chest pain. His symptom has completely resolved. EKG shows no acute ischemic changes, mild bradycardia likely secondary to blood pressure medication. No complaints of presyncopal or syncope. He is currently wearing a loop recorder. Labs otherwise reassuring. Troponin is negative.  i have consulted  cardiology and spoke with Trish who will send a specialist to evaluate pt. Care discussed with Dr. Alvino Chapel.    3:54 PM Loop recorder has been interrogated and no arrhythmia detected a heart rate is consistently in the 50s.Patient has been seen and evaluated by cardiologist. Patient is stable for discharge with outpatient follow-up with cardiologist Dr. Sallyanne Kuster.  Return precaution discussed.    Labs Review Labs Reviewed  BASIC METABOLIC PANEL - Abnormal; Notable for the following:    Glucose, Bld 120 (*)    BUN 27 (*)    GFR calc non Af Amer 50 (*)    GFR calc Af Amer 58 (*)    All other components within normal limits  CBC  BRAIN NATRIURETIC PEPTIDE  PROTIME-INR  I-STAT TROPOININ, ED    Imaging Review Dg Chest 2 View  04/19/2014   CLINICAL DATA:  Chest pain and fever today. History of CABG. Initial encounter.  EXAM:  CHEST  2 VIEW  COMPARISON:  03/24/2013 and 07/07/2009 radiographs.  FINDINGS: The heart size and mediastinal contours are stable status post CABG. There is stable left infrahilar scarring. No edema, confluent airspace opacity or pleural effusion demonstrated. The bones appear unremarkable.  IMPRESSION: Stable postoperative chest.  No acute cardiopulmonary process.   Electronically Signed   By: Camie Patience M.D.   On: 04/19/2014 13:03     EKG Interpretation   Date/Time:  Monday April 19 2014 11:57:11 EST Ventricular Rate:  58 PR Interval:  208 QRS Duration: 94 QT Interval:  462 QTC Calculation: 453 R Axis:   69 Text Interpretation:  Sinus bradycardia Otherwise normal ECG Confirmed by  Alvino Chapel  MD, Ovid Curd [phone]) on 04/19/2014 1:31:54 PM      MDM   Final diagnoses:  Chest pain, unspecified chest pain type    BP 146/68 mmHg  Pulse 56  Temp(Src) 98 F (36.7 C)  Resp 13  SpO2 95%  I have reviewed nursing notes and vital signs. I personally reviewed the imaging tests through PACS system  I reviewed available ER/hospitalization records thought the  EMR     Domenic Moras, PA-C 04/19/14 [place]. Alvino Chapel, MD 04/19/14 1655

## 2014-04-19 NOTE — ED Notes (Signed)
Per pt sts episode of chest pain this am. sts he felt like he had a hot flash and some nausea. Denies any pain now. Denies SOB.

## 2014-04-19 NOTE — ED Notes (Signed)
Michael Bryant, Cardiology PA, results of loop recorder interogation

## 2014-04-19 NOTE — Telephone Encounter (Signed)
Pt's wife calls in today stating that patient is calling complaining of chest pain, hot flashes and numbness of arms. Pt states that he "just isn't feeling right". Pt stated that he took a NTG and that he didn't have a headache following taking the pill. Pt states that he does no longer have chest pain, but still feeling hot and "uselessness of arms". Advised pt to go to the ED. Wife stated that she would take patient to the ED.

## 2014-04-19 NOTE — H&P (Signed)
Patient ID: Michael Bryant MRN: [id number], DOB/AGE: [birth year]   Admit date: 04/19/2014   Primary Physician: [name], MD Primary Cardiologist: Dr. Sallyanne Kuster   Pt. Profile:  Michael Bryant is a 81 y.o. male with a history of LAD s/p CABG (2003), HTN, HLN, previous syncope s/p loop recorder who presented today with chest pain.    He was initially seen for emergency CABG 12/16/2001 with a witnessed V. fib arrest defibrillated taken to the Cath Lab and found to have significant LAD disease with intraluminal thrombus. He underwent LIMA-LAD, VG-2 OM 1, VG-distal RCA. In 03/2013 he had left arm pain and diaphoresis and was admitted to [facility] He ruled out for MI and underwent a stress Myoview which was negative for ischemia; EF 60%.In 06/2013 he had an episode of what sounds like full-blown syncope following sexual intercourse. When 911 emergency services arrived was found to have extremely frequent PVCs and NSVT on the monitor. This all happened in [place]. He was prescribed a beta blocker which had been avoided in the past due to symptomatic bradycardia. He was placed on an event monitor and eventually had a loop recorder placed. He underwent cardiac catheterization on 07/02/13 which revealed IMPRESSIONS:  Severe native coronary artery disease, graft dependent. Widely patent bypass grafts. Normal left ventricular systolic function It was recommended that he proceed with BB and amiodarone therapy.   Patient was in his usual state of health until this morning. He went on a walk this AM ~3-4 miles without any difficulty. Afterwards while he was driving his car he began experiencing a mild chest discomfort on his right breast. Patient correlate his discomfort with the seatbelt but he also reported having a flushing sensation throughout body lasting for a few seconds. Reports some mild nausea but that subsequently resolved. He also felt a tingling sensation to both of his arms lasting for an hr.  He  did take one sublingual nitroglycerin which gave him a headache. He denies having any SOB, palpations, recurrent syncope, lightheadedness or dizziness. No orthopnea, PND or LE edema. Currently he is feeling well.    Problem List  Past Medical History  Diagnosis Date  . MI (myocardial infarction)   . CAD (coronary artery disease)   . Hyperlipidemia   . NSVT (nonsustained ventricular tachycardia) 06/16/2013  . Cardiac arrest 2003    Past Surgical History  Procedure Laterality Date  . Coronary artery bypass graft  2004    LIMA to LAD, SVG to OM, SVG to RCA  . Tonsillectomy and adenoidectomy    . Cardiac catheterization  12/15/2001    60% proximal LAD narrowing with active mobile thrombosis w/50% sequential areas,50-60% pro  . Nm myocar perf wall motion  12/09/2009    mod. perfusion defect basal inferior and mid inferior regions c/w  infarct/scar, EF 54%  . Left heart catheterization with coronary/graft angiogram N/A 07/02/2013    Procedure: LEFT HEART CATHETERIZATION WITH Beatrix Fetters;  Surgeon: Sanda Klein, MD;  Location: [facility]ATH [facility];  Service: Cardiovascular;  Laterality: N/A;     Allergies  Allergies  Allergen Reactions  . Codeine Other (See Comments)    "wont go to sleep"     Home Medications  Prior to Admission medications   Medication Sig Start Date End Date Taking? Authorizing Provider  amiodarone (PACERONE) 200 MG tablet Take 200 mg by mouth daily.  06/13/13   Historical Provider, MD  aspirin EC 81 MG tablet Take 81 mg by mouth daily.  Historical Provider, MD  azelastine (ASTELIN) 0.1 % nasal spray Place 1 spray into both nostrils daily as needed for rhinitis.  04/24/13   Historical Provider, MD  beta carotene w/minerals (OCUVITE) tablet Take 1 tablet by mouth daily.    Historical Provider, MD  CINNAMON PO Take 1,000 mg by mouth daily.     Historical Provider, MD  DICLOFENAC PO Take 1 tablet by mouth daily.    Historical Provider, MD  fish oil-omega-3 fatty  acids 1000 MG capsule Take 1 g by mouth at bedtime.     Historical Provider, MD  Glucosamine HCl (GLUCOSAMINE PO) Take 1 tablet by mouth 2 (two) times daily.    Historical Provider, MD  montelukast (SINGULAIR) 10 MG tablet Take 1 tablet by mouth daily. 11/17/13   Historical Provider, MD  nitroGLYCERIN (NITROSTAT) 0.4 MG SL tablet Place 1 tablet (0.4 mg total) under the tongue every 5 (five) minutes as needed for chest pain. 03/25/14   Mcguire Gasparyan, MD  omeprazole (PRILOSEC) 10 MG capsule Take 10 mg by mouth daily.    Historical Provider, MD  Polyethyl Glycol-Propyl Glycol (SYSTANE OP) Place 1 drop into both eyes daily as needed (dry eyes).    Historical Provider, MD  Saw Palmetto, Serenoa repens, (SAW PALMETTO PO) Take 1 tablet by mouth daily.    Historical Provider, MD  simvastatin (ZOCOR) 40 MG tablet Take 40 mg by mouth at bedtime.    Historical Provider, MD  tamsulosin (FLOMAX) 0.4 MG CAPS capsule Take 0.4 mg by mouth daily after supper.  03/02/13   Historical Provider, MD  trandolapril (MAVIK) 2 MG tablet Take 1 tablet by mouth daily. 07/31/13   Historical Provider, MD  trandolapril (MAVIK) 2 MG tablet Take 1 tablet (2 mg total) by mouth daily. 04/05/14   Sanda Klein, MD    Family History  Family History  Problem Relation Age of Onset  . CAD Father 37    Died age 24  . Cancer Mother 67   Family Status  Relation Status Death Age  . Mother Deceased     cancer  . Father Deceased     heart diseaes  . Brother Deceased   . Brother Deceased   . Brother Deceased   . Brother Alive   . Brother Alive   . Brother Alive   . Sister Deceased   . Sister Deceased   . Sister Deceased   . Sister Deceased   . Sister Deceased   . Sister Alive      Social History  History   Social History  . Marital Status: Married    Spouse Name: N/A    Number of Children: 2  . Years of Education: N/A   Occupational History  . Retired    Social History Main Topics  . Smoking status: Former  Smoker    Types: Cigarettes  . Smokeless tobacco: Not on file     Comment: Quit 1993  . Alcohol Use: No  . Drug Use: No  . Sexual Activity: Not on file   Other Topics Concern  . Not on file   Social History Narrative   Lives at home with wife.            All other systems reviewed and are otherwise negative except as noted above.  Physical Exam  Blood pressure 136/70, pulse 53, temperature 98 F (36.7 C), resp. rate 16, SpO2 93 %.  General: Pleasant, NAD Psych: Normal affect. Neuro: Alert and oriented X 3. Moves  all extremities spontaneously. HEENT: Normal  Neck: Supple without bruits or JVD. Lungs:  Resp regular and unlabored, CTA. Heart: RRR no s3, s4, or murmurs. Abdomen: Soft, non-tender, non-distended, BS + x 4.  Extremities: No clubbing, cyanosis or edema. DP/PT/Radials 2+ and equal bilaterally.  Labs  No results for input(s): CKTOTAL, CKMB, TROPONINI in the last 72 hours. Lab Results  Component Value Date   WBC 5.0 04/19/2014   HGB 14.8 04/19/2014   HCT 43.8 04/19/2014   MCV 93.6 04/19/2014   PLT 183 04/19/2014    Recent Labs Lab 04/19/14 1156  NA 140  K 4.6  CL 104  CO2 30  BUN 27*  CREATININE 1.32  CALCIUM 8.9  GLUCOSE 120*   Lab Results  Component Value Date   CHOL 122 03/25/2013   HDL 52 03/25/2013   LDLCALC 49 03/25/2013   TRIG 104 03/25/2013   No results found for: DDIMER   Radiology/Studies  Dg Chest 2 View  04/19/2014   CLINICAL DATA:  Chest pain and fever today. History of CABG. Initial encounter.  EXAM: CHEST  2 VIEW  COMPARISON:  03/24/2013 and 07/07/2009 radiographs.  FINDINGS: The heart size and mediastinal contours are stable status post CABG. There is stable left infrahilar scarring. No edema, confluent airspace opacity or pleural effusion demonstrated. The bones appear unremarkable.  IMPRESSION: Stable postoperative chest.  No acute cardiopulmonary process.   Electronically Signed   By: Camie Patience M.D.   On: 04/19/2014 13:03        CARDIAC CATHETERIZATION REPORT  Procedures performed:  1. Left heart catheterization  2. Selective coronary angiography  3. Selective LIMA bypass angiography 4. Selective SVG bypass angiography x 2 5. Left ventriculography   Reason for procedure:  CAD s/p CABG VT Syncope  Angiographic Findings:  1. The left main coronary artery is free of significant stenoses, but has extensive atherosclerosis and calcifications. It trifurcates into the left anterior descending artery, ramus intermedius (small to medium size) and left circumflex coronary artery.  2. The left anterior descending artery is a large vessel that reaches the apex and generates two major diagonal branches. There is evidence of severe stenosis and calcification over the entire proximal third of the vessel. The severity of stenosis ranges from 60-90%. Beyond the first septal and first diagonal arteries flow comes primarily from the mammary artery bypass. The mid and distal vessel are visualized during the mammary artery bypass injection. There is mild diffuse atherosclerosis without flow limiting stenoses. 3. The left circumflex coronary artery is occluded beyond the AV groove branch. The major oblique marginal artery fills via a patent saphenous vein graft.  The major oblique marginal artery is seen during bypass graft injection and is a bifurcating medium size vessel. There is little retrograde flow to a tiny more proximal oblique marginal vessel. 4. The right coronary artery is a large-size dominant vessel that is totally occluded beyond the mid vessel and RV marginal branch. The distal portion of the vessel is seen during injection of the saphenous vein graft . It generates a large trifurcating posterior lateral ventricular system as well as the PDA. There is evidence of mild luminal irregularities and mild calcification. No hemodynamically meaningful stenoses are seen.  5. The saphenous vein graft bypass to the  oblique marginal artery is a widely patent and healthy appearing vessel with minimal luminal irregularities. 6. the saphenous vein graft bypass to the right coronary artery connects at the level of the crux. It is a large and  healthy appearing vessel with minimal luminal irregularities especially in its proximal portion  7 The left ventricle is normal in size. The left ventricle systolic function is normal with an estimated ejection fraction of 60 %. Regional wall motion abnormalities are not seen. No left ventricular thrombus is seen. There is no mitral insufficiency. The ascending aorta appears normal. There is no aortic valve stenosis by pullback. The left ventricular end-diastolic pressure is 17 mm Hg.    IMPRESSIONS:  Severe native coronary artery disease, graft dependent. Widely patent bypass grafts. Normal left ventricular systolic function  RECOMMENDATION:  Continue beta blocker and amiodarone therapy. Consider implantable loop recorder for syncope     ECG  HR 58 Sinus bradycardia Otherwise normal ECG  ASSESSMENT AND PLAN  Michael Bryant is a 81 y.o. male with a history of LAD s/p CABG (2003), HTN, HLN, previous syncope s/p loop recorder who presented today with chest pain.   Chest pain/CAD s/p CABG- Negative nuc in 03/2013. LHC in 06/2013 with stable severe CAD graft dependant w/ widely patent bypass grafts. Normal systolic function -- Troponin x1 negative, ECG with no acute ST or TW changes -- Continue ASA and statin. Not on BB due to bradycardia.  -- Seen by Dr. Sallyanne Kuster who felt he was stable for discharge home today.    Previous syncope, NSVT- s/p Loop recorder. -- No clear-cut rhythm abnormality has been identified as the cause for this. He has features that suggest risk for both bradycardia and ventricular tachycardia, by previous monitoring, but no events have been recorded since loop recorder implantation in 08/2013.  -- Loop last interrogated 03/11/14 with normal  device functioning and no events. -- Continue amiodarone -- Loop interrogated today with no new events. WIll send home from ED today.   HTN- currently well controlled on trandolapril 2mg   HLD- continue statin, fish oil.  GERD- continue omeprazole    Signed, Eileen Stanford, PA-C 04/19/2014, 2:03 PM  Pager [phone]  I have seen and examined the patient along with THOMPSON, KATHRYN R, PA-C.  I have reviewed the chart, notes and new data.  I agree with PA's note.  Key new complaints: atypical chest discomfort Key examination changes: no arrhythmia or signs of CHF Key new findings / data: no ILR recorded arrhythmia, low risk ECG and enzymes  PLAN: Symptoms are not consistent with his previous presentations with CAD and have resolved. Further workup as outpatient.  Sanda Klein, MD, [place] [phone] 04/19/2014, 5:49 PM

## 2014-04-19 NOTE — ED Notes (Signed)
Pt undressed, in gown, on monitor, continuous pulse oximetry and blood pressure cuff; visitor at bedside 

## 2014-04-19 NOTE — Discharge Instructions (Signed)
Please follow up with your cardiologist for further management of your condition.  Return if your symptoms worsen or if you have other concerns.    Chest Pain (Nonspecific) It is often hard to give a diagnosis for the cause of chest pain. There is always a chance that your pain could be related to something serious, such as a heart attack or a blood clot in the lungs. You need to follow up with your doctor. HOME CARE  If antibiotic medicine was given, take it as directed by your doctor. Finish the medicine even if you start to feel better.  For the next few days, avoid activities that bring on chest pain. Continue physical activities as told by your doctor.  Do not use any tobacco products. This includes cigarettes, chewing tobacco, and e-cigarettes.  Avoid drinking alcohol.  Only take medicine as told by your doctor.  Follow your doctor's suggestions for more testing if your chest pain does not go away.  Keep all doctor visits you made. GET HELP IF:  Your chest pain does not go away, even after treatment.  You have a rash with blisters on your chest.  You have a fever. GET HELP RIGHT AWAY IF:   You have more pain or pain that spreads to your arm, neck, jaw, back, or belly (abdomen).  You have shortness of breath.  You cough more than usual or cough up blood.  You have very bad back or belly pain.  You feel sick to your stomach (nauseous) or throw up (vomit).  You have very bad weakness.  You pass out (faint).  You have chills. This is an emergency. Do not wait to see if the problems will go away. Call your local emergency services (911 in [place] Do not drive yourself to the hospital. MAKE SURE YOU:   Understand these instructions.  Will watch your condition.  Will get help right away if you are not doing well or get worse. Document Released: 08/15/2007 Document Revised: 03/03/2013 Document Reviewed: 08/15/2007 [facility] Patient Information 2015 [place], [place].  This information is not intended to replace advice given to you by your health care provider. Make sure you discuss any questions you have with your health care provider.

## 2014-04-21 ENCOUNTER — Encounter: Payer: Self-pay | Admitting: Cardiovascular Disease

## 2014-04-21 DIAGNOSIS — R5381 Other malaise: Secondary | ICD-10-CM | POA: Diagnosis not present

## 2014-04-21 DIAGNOSIS — I10 Essential (primary) hypertension: Secondary | ICD-10-CM | POA: Diagnosis not present

## 2014-04-21 DIAGNOSIS — E780 Pure hypercholesterolemia: Secondary | ICD-10-CM | POA: Diagnosis not present

## 2014-04-21 DIAGNOSIS — E118 Type 2 diabetes mellitus with unspecified complications: Secondary | ICD-10-CM | POA: Diagnosis not present

## 2014-04-28 ENCOUNTER — Encounter: Payer: Self-pay | Admitting: Cardiovascular Disease

## 2014-04-28 ENCOUNTER — Ambulatory Visit (INDEPENDENT_AMBULATORY_CARE_PROVIDER_SITE_OTHER): Payer: Medicare Other | Admitting: Cardiovascular Disease

## 2014-04-28 VITALS — BP 118/76 | HR 62 | Resp 16 | Ht 70.0 in | Wt 193.8 lb

## 2014-04-28 DIAGNOSIS — I4729 Other ventricular tachycardia: Secondary | ICD-10-CM

## 2014-04-28 DIAGNOSIS — I472 Ventricular tachycardia: Secondary | ICD-10-CM

## 2014-04-28 DIAGNOSIS — I2510 Atherosclerotic heart disease of native coronary artery without angina pectoris: Secondary | ICD-10-CM

## 2014-04-28 DIAGNOSIS — Z8679 Personal history of other diseases of the circulatory system: Secondary | ICD-10-CM

## 2014-04-28 DIAGNOSIS — I10 Essential (primary) hypertension: Secondary | ICD-10-CM | POA: Diagnosis not present

## 2014-04-28 DIAGNOSIS — I252 Old myocardial infarction: Secondary | ICD-10-CM

## 2014-04-28 MED ORDER — AMIODARONE HCL 200 MG PO TABS: 100.0000 mg | ORAL_TABLET | Freq: Every day | ORAL | Status: DC

## 2014-04-28 NOTE — Patient Instructions (Signed)
Dr.Croitoru wants you to follow-up in: 6 months. You will receive a reminder letter in the mail two months in advance. If you don't receive a letter, please call our office to schedule the follow-up appointment.   DECREASE your Amiodarone to 100 mg (1/2 tablet) Daily

## 2014-04-28 NOTE — Progress Notes (Signed)
Patient ID: Michael Bryant, male   DOB: [birth year], 81 y.o.   MRN: [id number]      Reason for office visit F/U after ED evaluation for chest pain, history of CAD, syncope  Michael Bryant is a 81 y.o. male with a history of LAD s/p CABG (2003), HTN, HLN, previous syncope s/p loop recorder who presented 04/19/14 with atypical chest pain and had low risk ECG, cardiac enzymes and no arrhythmia on loop recorder. Mo recurrence of any cardiac complaints since.   He was initially seen for emergency CABG 12/16/2001 with a witnessed V. fib arrest defibrillated taken to the Cath Lab and found to have significant LAD disease with intraluminal thrombus. He underwent LIMA-LAD, VG-2 OM 1, VG-distal RCA. In 03/2013 he had left arm pain and diaphoresis and was admitted to [facility]. He ruled out for MI and underwent a stress Myoview which was negative for ischemia; EF 60%.In 06/2013 he had an episode of what sounds like full-blown syncope following sexual intercourse. When 911 emergency services arrived was found to have extremely frequent PVCs and NSVT on the monitor. This all happened in [place]. He was prescribed a beta blocker which had been avoided in the past due to symptomatic bradycardia. He was placed on an event monitor and eventually had a loop recorder placed. He underwent cardiac catheterization on 07/02/13 which revealed: "Severe native coronary artery disease, graft dependent. Widely patent bypass grafts. Normal left ventricular systolic function. It was recommended that he proceed with BB and amiodarone therapy. "    Allergies  Allergen Reactions  . Codeine Other (See Comments)    "wont go to sleep"    Current Outpatient Prescriptions  Medication Sig Dispense Refill  . amiodarone (PACERONE) 200 MG tablet Take 0.5 tablets (100 mg total) by mouth daily. 45 tablet 2  . aspirin EC 81 MG tablet Take 81 mg by mouth daily.    . beta carotene w/minerals (OCUVITE) tablet Take 1 tablet by mouth daily.    Marland Kitchen  CINNAMON PO Take 1,000 mg by mouth daily.     Marland Kitchen DICLOFENAC PO Take 100 mg by mouth daily.     . fish oil-omega-3 fatty acids 1000 MG capsule Take 1 g by mouth at bedtime.     . Glucosamine HCl (GLUCOSAMINE PO) Take 1 tablet by mouth 2 (two) times daily.    . Misc Natural Products (PROSTATE HEALTH PO) Take 1 tablet by mouth daily.    . nitroGLYCERIN (NITROSTAT) 0.4 MG SL tablet Place 1 tablet (0.4 mg total) under the tongue every 5 (five) minutes as needed for chest pain. 25 tablet 2  . omeprazole (PRILOSEC) 10 MG capsule Take 10 mg by mouth daily.    [name] (SYSTANE OP) Place 1 drop into both eyes daily as needed (dry eyes).    . simvastatin (ZOCOR) 40 MG tablet Take 40 mg by mouth at bedtime.    . tamsulosin (FLOMAX) 0.4 MG CAPS capsule Take 0.4 mg by mouth daily after supper.     . trandolapril (MAVIK) 2 MG tablet Take 1 tablet (2 mg total) by mouth daily. 30 tablet 8   No current facility-administered medications for this visit.    Past Medical History  Diagnosis Date  . CAD (coronary artery disease)     a. s/p CABG (2003)  . Hyperlipidemia   . NSVT (nonsustained ventricular tachycardia) 06/16/2013  . Cardiac arrest 2003    Past Surgical History  Procedure Laterality Date  . Coronary artery  bypass graft  2004    LIMA to LAD, SVG to OM, SVG to RCA  . Tonsillectomy and adenoidectomy    . Cardiac catheterization  12/15/2001    60% proximal LAD narrowing with active mobile thrombosis w/50% sequential areas,50-60% pro  . Nm myocar perf wall motion  12/09/2009    mod. perfusion defect basal inferior and mid inferior regions c/w  infarct/scar, EF 54%  . Left heart catheterization with coronary/graft angiogram N/A 07/02/2013    Procedure: LEFT HEART CATHETERIZATION WITH Beatrix Fetters;  Surgeon: Sanda Klein, MD;  Location: [place] CATH LAB;  Service: Cardiovascular;  Laterality: N/A;    Family History  Problem Relation Age of Onset  . CAD Father 23     Died age 7  . Cancer Mother 53    History   Social History  . Marital Status: Married    Spouse Name: N/A  . Number of Children: 2  . Years of Education: N/A   Occupational History  . Retired    Social History Main Topics  . Smoking status: Former Smoker    Types: Cigarettes  . Smokeless tobacco: Not on file     Comment: Quit 1993  . Alcohol Use: No  . Drug Use: No  . Sexual Activity: Not on file   Other Topics Concern  . Not on file   Social History Narrative   Lives at home with wife.           Review of systems: The patient specifically denies any chest pain at rest or with exertion, dyspnea at rest or with exertion, orthopnea, paroxysmal nocturnal dyspnea, syncope, palpitations, focal neurological deficits, intermittent claudication, lower extremity edema, unexplained weight gain, cough, hemoptysis or wheezing.  The patient also denies abdominal pain, nausea, vomiting, dysphagia, diarrhea, constipation, polyuria, polydipsia, dysuria, hematuria, frequency, urgency, abnormal bleeding or bruising, fever, chills, unexpected weight changes, mood swings, change in skin or hair texture, change in voice quality, auditory or visual problems, allergic reactions or rashes, new musculoskeletal complaints other than usual "aches and pains".   PHYSICAL EXAM BP 118/76 mmHg  Pulse 62  Ht 5\' 10"  (1.778 m)  Wt 87.907 kg (193 lb 12.8 oz)  BMI 27.81 kg/m2 Healthy ILR site General: Alert, oriented x3, no distress  Head: no evidence of trauma, PERRL, EOMI, no exophtalmos or lid lag, no myxedema, no xanthelasma; normal ears, nose and oropharynx  Neck: normal jugular venous pulsations and no hepatojugular reflux; brisk carotid pulses without delay and no carotid bruits  Chest: clear to auscultation, no signs of consolidation by percussion or palpation, normal fremitus, symmetrical and full respiratory excursions; healed sternotomy  Cardiovascular: normal position and quality of the  apical impulse, regular rhythm, normal first and second heart sounds, no murmurs, rubs or gallops  Abdomen: no tenderness or distention, no masses by palpation, no abnormal pulsatility or arterial bruits, normal bowel sounds, no hepatosplenomegaly  Extremities: no clubbing, cyanosis or edema; 2+ radial, ulnar and brachial pulses bilaterally; 2+ right femoral, posterior tibial and dorsalis pedis pulses; 2+ left femoral, posterior tibial and dorsalis pedis pulses; no subclavian or femoral bruits  Neurological: grossly nonfocal   Lipid Panel     Component Value Date/Time   CHOL 122 03/25/2013 0312   TRIG 104 03/25/2013 0312   HDL 52 03/25/2013 0312   CHOLHDL 2.3 03/25/2013 0312   VLDL 21 03/25/2013 0312   LDLCALC 49 03/25/2013 0312    BMET    Component Value Date/Time   NA 140 04/19/2014 1156  K 4.6 04/19/2014 1156   CL 104 04/19/2014 1156   CO2 30 04/19/2014 1156   GLUCOSE 120* 04/19/2014 1156   BUN 27* 04/19/2014 1156   CREATININE 1.32 04/19/2014 1156   CREATININE 0.96 06/30/2013 1653   CALCIUM 8.9 04/19/2014 1156   GFRNONAA 50* 04/19/2014 1156   GFRAA 50* 04/19/2014 1156     ASSESSMENT AND PLAN CAD (coronary artery disease), hx CABG 2003 No evidence to support new acute coronary event.  Syncope and reported NSVT No arrhythmia since loop recorder implantation. Will start to wean off amiodarone gradually. Reduce dose to 100 mg daily.  Orders Placed This Encounter  Procedures  . EKG 12-Lead   Meds ordered this encounter  Medications  . Misc Natural Products (PROSTATE HEALTH PO)    Sig: Take 1 tablet by mouth daily.  Marland Kitchen amiodarone (PACERONE) 200 MG tablet    Sig: Take 0.5 tablets (100 mg total) by mouth daily.    Dispense:  45 tablet    Refill:  2    Tadhg Eskew  Sanda Klein, MD, [facility] [phone] office [phone] pager

## 2014-04-30 ENCOUNTER — Encounter: Payer: Self-pay | Admitting: Cardiovascular Disease

## 2014-05-03 ENCOUNTER — Ambulatory Visit (INDEPENDENT_AMBULATORY_CARE_PROVIDER_SITE_OTHER): Payer: Medicare Other | Admitting: Podiatry

## 2014-05-03 DIAGNOSIS — B351 Tinea unguium: Secondary | ICD-10-CM

## 2014-05-03 DIAGNOSIS — M79673 Pain in unspecified foot: Secondary | ICD-10-CM | POA: Diagnosis not present

## 2014-05-03 NOTE — Progress Notes (Signed)
Subjective:     Patient ID: Michael Bryant, male   DOB: 06-Jul-1933, 81 y.o.   MRN: [id number]  HPI patient's found to have thick yellow nailbeds that are painful 1-5 both feet   Review of Systems     Objective:   Physical Exam Neurovascular status intact with thick yellow brittle nailbeds 1-5 both feet    Assessment:     Mycotic nail infection is with pain 1-5 both feet    Plan:     Debris painful nailbeds 1-5 both feet with no iatrogenic bleeding noted

## 2014-05-11 ENCOUNTER — Ambulatory Visit (INDEPENDENT_AMBULATORY_CARE_PROVIDER_SITE_OTHER): Payer: Medicare Other | Admitting: *Deleted

## 2014-05-11 DIAGNOSIS — R55 Syncope and collapse: Secondary | ICD-10-CM | POA: Diagnosis not present

## 2014-05-12 LAB — MDC_IDC_ENUM_SESS_TYPE_REMOTE

## 2014-05-13 NOTE — Progress Notes (Signed)
Loop recorder 

## 2014-05-31 ENCOUNTER — Encounter: Payer: Self-pay | Admitting: Cardiovascular Disease

## 2014-06-10 ENCOUNTER — Ambulatory Visit (INDEPENDENT_AMBULATORY_CARE_PROVIDER_SITE_OTHER): Payer: Medicare Other | Admitting: *Deleted

## 2014-06-10 DIAGNOSIS — R55 Syncope and collapse: Secondary | ICD-10-CM | POA: Diagnosis not present

## 2014-06-10 NOTE — Progress Notes (Signed)
Loop recorder 

## 2014-06-11 ENCOUNTER — Encounter: Payer: Self-pay | Admitting: Cardiovascular Disease

## 2014-07-05 LAB — MDC_IDC_ENUM_SESS_TYPE_REMOTE

## 2014-07-09 ENCOUNTER — Ambulatory Visit (INDEPENDENT_AMBULATORY_CARE_PROVIDER_SITE_OTHER): Payer: Medicare Other | Admitting: *Deleted

## 2014-07-09 DIAGNOSIS — R55 Syncope and collapse: Secondary | ICD-10-CM | POA: Diagnosis not present

## 2014-07-14 NOTE — Progress Notes (Signed)
Loop recorder 

## 2014-07-20 ENCOUNTER — Encounter: Payer: Self-pay | Admitting: Cardiovascular Disease

## 2014-07-22 ENCOUNTER — Telehealth: Payer: Self-pay | Admitting: Cardiovascular Disease

## 2014-07-23 NOTE — Telephone Encounter (Signed)
Close encounter 

## 2014-07-26 ENCOUNTER — Ambulatory Visit: Payer: Medicare Other

## 2014-07-27 ENCOUNTER — Ambulatory Visit (INDEPENDENT_AMBULATORY_CARE_PROVIDER_SITE_OTHER): Payer: Medicare Other | Admitting: Podiatry

## 2014-07-27 DIAGNOSIS — M79673 Pain in unspecified foot: Secondary | ICD-10-CM

## 2014-07-27 DIAGNOSIS — B351 Tinea unguium: Secondary | ICD-10-CM

## 2014-07-27 NOTE — Progress Notes (Signed)
   Subjective:    Patient ID: Michael Bryant, male    DOB: [birth year], 81 y.o.   MRN: [id number]  HPI The patient is here today for B/L toenail debridemen, is being seen today by Dr Prudence Davidson.  Review of Systems  All other systems reviewed and are negative.  HPI Presents today chief complaint of painful elongated toenails.  Objective: Pulses are palpable bilateral nails are thick, yellow dystrophic onychomycosis and painful palpation.   Assessment: Onychomycosis with pain in limb.  Plan: Treatment of nails in thickness and length as covered service secondary to pain.     Objective:   Physical Exam        Assessment & Plan:

## 2014-07-29 LAB — CUP PACEART REMOTE DEVICE CHECK: Date Time Interrogation Session: 20160519112854

## 2014-08-10 ENCOUNTER — Ambulatory Visit (INDEPENDENT_AMBULATORY_CARE_PROVIDER_SITE_OTHER): Payer: Medicare Other | Admitting: *Deleted

## 2014-08-10 DIAGNOSIS — R55 Syncope and collapse: Secondary | ICD-10-CM

## 2014-08-13 NOTE — Progress Notes (Signed)
Loop recorder 

## 2014-08-17 LAB — CUP PACEART REMOTE DEVICE CHECK: Date Time Interrogation Session: 20160607104723

## 2014-08-24 ENCOUNTER — Encounter: Payer: Self-pay | Admitting: Cardiovascular Disease

## 2014-09-03 ENCOUNTER — Encounter: Payer: Self-pay | Admitting: Cardiovascular Disease

## 2014-09-08 ENCOUNTER — Ambulatory Visit (INDEPENDENT_AMBULATORY_CARE_PROVIDER_SITE_OTHER): Payer: Medicare Other | Admitting: *Deleted

## 2014-09-08 DIAGNOSIS — R55 Syncope and collapse: Secondary | ICD-10-CM

## 2014-09-08 LAB — CUP PACEART REMOTE DEVICE CHECK: Date Time Interrogation Session: 20160629161249

## 2014-09-08 NOTE — Progress Notes (Signed)
Loop recorder 

## 2014-09-17 DIAGNOSIS — Z85828 Personal history of other malignant neoplasm of skin: Secondary | ICD-10-CM | POA: Diagnosis not present

## 2014-09-17 DIAGNOSIS — L821 Other seborrheic keratosis: Secondary | ICD-10-CM | POA: Diagnosis not present

## 2014-09-17 DIAGNOSIS — L859 Epidermal thickening, unspecified: Secondary | ICD-10-CM | POA: Diagnosis not present

## 2014-09-17 DIAGNOSIS — L570 Actinic keratosis: Secondary | ICD-10-CM | POA: Diagnosis not present

## 2014-09-17 DIAGNOSIS — D485 Neoplasm of uncertain behavior of skin: Secondary | ICD-10-CM | POA: Diagnosis not present

## 2014-09-17 DIAGNOSIS — L281 Prurigo nodularis: Secondary | ICD-10-CM | POA: Diagnosis not present

## 2014-10-06 DIAGNOSIS — H3531 Nonexudative age-related macular degeneration: Secondary | ICD-10-CM | POA: Diagnosis not present

## 2014-10-06 DIAGNOSIS — H43813 Vitreous degeneration, bilateral: Secondary | ICD-10-CM | POA: Diagnosis not present

## 2014-10-06 DIAGNOSIS — H18003 Unspecified corneal deposit, bilateral: Secondary | ICD-10-CM | POA: Diagnosis not present

## 2014-10-08 ENCOUNTER — Ambulatory Visit (INDEPENDENT_AMBULATORY_CARE_PROVIDER_SITE_OTHER): Payer: Medicare Other | Admitting: *Deleted

## 2014-10-08 DIAGNOSIS — R55 Syncope and collapse: Secondary | ICD-10-CM | POA: Diagnosis not present

## 2014-10-13 NOTE — Progress Notes (Signed)
Loop recorder 

## 2014-10-18 DIAGNOSIS — E119 Type 2 diabetes mellitus without complications: Secondary | ICD-10-CM | POA: Diagnosis not present

## 2014-10-18 DIAGNOSIS — Z125 Encounter for screening for malignant neoplasm of prostate: Secondary | ICD-10-CM | POA: Diagnosis not present

## 2014-10-18 DIAGNOSIS — I10 Essential (primary) hypertension: Secondary | ICD-10-CM | POA: Diagnosis not present

## 2014-10-22 LAB — CUP PACEART REMOTE DEVICE CHECK: MDC IDC SESS DTM: 20160812132729

## 2014-10-25 ENCOUNTER — Encounter: Payer: Self-pay | Admitting: Cardiovascular Disease

## 2014-10-25 ENCOUNTER — Ambulatory Visit (INDEPENDENT_AMBULATORY_CARE_PROVIDER_SITE_OTHER): Payer: Medicare Other | Admitting: Cardiovascular Disease

## 2014-10-25 VITALS — BP 125/69 | HR 78 | Resp 16 | Ht 70.5 in | Wt 186.5 lb

## 2014-10-25 DIAGNOSIS — Z8679 Personal history of other diseases of the circulatory system: Secondary | ICD-10-CM

## 2014-10-25 DIAGNOSIS — I4729 Other ventricular tachycardia: Secondary | ICD-10-CM

## 2014-10-25 DIAGNOSIS — I10 Essential (primary) hypertension: Secondary | ICD-10-CM

## 2014-10-25 DIAGNOSIS — I472 Ventricular tachycardia: Secondary | ICD-10-CM | POA: Diagnosis not present

## 2014-10-25 DIAGNOSIS — I2510 Atherosclerotic heart disease of native coronary artery without angina pectoris: Secondary | ICD-10-CM

## 2014-10-25 DIAGNOSIS — R55 Syncope and collapse: Secondary | ICD-10-CM

## 2014-10-25 DIAGNOSIS — I252 Old myocardial infarction: Secondary | ICD-10-CM

## 2014-10-25 NOTE — Patient Instructions (Signed)
Medication Instructions:   STOP AMIODARONE    Follow-Up:  ONEYEAR

## 2014-10-25 NOTE — Progress Notes (Signed)
Patient ID: Michael Bryant, male   DOB: 1933/03/15, 81 y.o.   MRN: [id number]     Cardiology Office Note   Date:  10/26/2014   ID:  Michael Bryant, DOB 12-16-1933, MRN [id number]  PCP:  Michael Gravel, MD  Cardiologist:   Michael Klein, MD   Chief Complaint  Patient presents with  . Follow-up    History of Present Illness: Michael Bryant is a 81 y.o. male who presents for coronary disease status post remote bypass surgery, history of ventricular tachycardia arrest, hyperlipidemia, recent syncope.  Michael Bryant feels great. He does not have angina pectoris or exertional dyspnea, although he takes care of all his own housework and yard work. He has not had any episodes of syncope or near syncope. His loop recorder has not demonstrated any episodes of bradycardia arrhythmia or ventricular tachycardia. Last episode of documented nonsustained VT was in April 2015.  He expresses a desire to discontinue amiodarone therapy. He has been on a minimum dose of 100 mg daily for the last year without events.  He was initially seen for emergency CABG 12/16/2001 with a witnessed V. fib arrest defibrillated taken to the Cath Lab and found to have significant LAD disease with intraluminal thrombus. He underwent LIMA-LAD, VG-2 OM 1, VG-distal RCA. In 03/2013 he had left arm pain and diaphoresis and was admitted to [facility]. He ruled out for MI and underwent a stress Myoview which was negative for ischemia; EF 60%.In 06/2013 he had an episode of what sounds like full-blown syncope following sexual intercourse. When 911 emergency services arrived was found to have extremely frequent PVCs and NSVT on the monitor. This all happened in [place]. He was prescribed a beta blocker which had been avoided in the past due to symptomatic bradycardia. He was placed on an event monitor and eventually had a loop recorder placed. He underwent cardiac catheterization on 07/02/13 which revealed: "Severe native coronary artery disease, graft dependent.  Widely patent bypass grafts. Normal left ventricular systolic function. It was recommended that he proceed with BB and amiodarone therapy. "  Past Medical History  Diagnosis Date  . CAD (coronary artery disease)     a. s/p CABG (2003)  . Hyperlipidemia   . NSVT (nonsustained ventricular tachycardia) 06/16/2013  . Cardiac arrest 2003    Past Surgical History  Procedure Laterality Date  . Coronary artery bypass graft  2004    LIMA to LAD, SVG to OM, SVG to RCA  . Tonsillectomy and adenoidectomy    . Cardiac catheterization  12/15/2001    60% proximal LAD narrowing with active mobile thrombosis w/50% sequential areas,50-60% pro  . Nm myocar perf wall motion  12/09/2009    mod. perfusion defect basal inferior and mid inferior regions c/w  infarct/scar, EF 54%  . Left heart catheterization with coronary/graft angiogram N/A 07/02/2013    Procedure: LEFT HEART CATHETERIZATION WITH Beatrix Fetters;  Surgeon: Michael Klein, MD;  Location: [place] CATH LAB;  Service: Cardiovascular;  Laterality: N/A;     Current Outpatient Prescriptions  Medication Sig Dispense Refill  . aspirin EC 81 MG tablet Take 81 mg by mouth daily.    . beta carotene w/minerals (OCUVITE) tablet Take 1 tablet by mouth daily.    Marland Kitchen CINNAMON PO Take 1,000 mg by mouth daily.     Marland Kitchen DICLOFENAC PO Take 100 mg by mouth daily.     . fish oil-omega-3 fatty acids 1000 MG capsule Take 1 g by mouth at bedtime.     Marland Kitchen  Glucosamine HCl (GLUCOSAMINE PO) Take 1 tablet by mouth 2 (two) times daily.    . Misc Natural Products (PROSTATE HEALTH PO) Take 1 tablet by mouth daily.    . nitroGLYCERIN (NITROSTAT) 0.4 MG SL tablet Place 1 tablet (0.4 mg total) under the tongue every 5 (five) minutes as needed for chest pain. 25 tablet 2  . omeprazole (PRILOSEC) 10 MG capsule Take 10 mg by mouth daily.    [name] (SYSTANE OP) Place 1 drop into both eyes daily as needed (dry eyes).    . simvastatin (ZOCOR) 40 MG tablet Take  40 mg by mouth at bedtime.    . tamsulosin (FLOMAX) 0.4 MG CAPS capsule Take 0.4 mg by mouth daily after supper.     . trandolapril (MAVIK) 2 MG tablet Take 1 tablet (2 mg total) by mouth daily. 30 tablet 8   No current facility-administered medications for this visit.    Allergies:   Codeine    Social History:  The patient  reports that he has quit smoking. His smoking use included Cigarettes. He does not have any smokeless tobacco history on file. He reports that he does not drink alcohol or use illicit drugs.   Family History:  The patient's family history includes CAD (age of onset: 47) in his father; Cancer (age of onset: 56) in his mother.    ROS:  Please see the history of present illness.    Otherwise, review of systems positive for none.   All other systems are reviewed and negative.    PHYSICAL EXAM: VS:  BP 125/69 mmHg  Pulse 78  Resp 16  Ht 5' 10.5" (1.791 m)  Wt 186 lb 8 oz (84.596 kg)  BMI 26.37 kg/m2 , BMI Body mass index is 26.37 kg/(m^2).  General: Alert, oriented x3, no distress Head: no evidence of trauma, PERRL, EOMI, no exophtalmos or lid lag, no myxedema, no xanthelasma; normal ears, nose and oropharynx Neck: normal jugular venous pulsations and no hepatojugular reflux; brisk carotid pulses without delay and no carotid bruits Chest: clear to auscultation, no signs of consolidation by percussion or palpation, normal fremitus, symmetrical and full respiratory excursions Cardiovascular: normal position and quality of the apical impulse, regular rhythm, normal first and second heart sounds, no murmurs, rubs or gallops Abdomen: no tenderness or distention, no masses by palpation, no abnormal pulsatility or arterial bruits, normal bowel sounds, no hepatosplenomegaly Extremities: no clubbing, cyanosis or edema; 2+ radial, ulnar and brachial pulses bilaterally; 2+ right femoral, posterior tibial and dorsalis pedis pulses; 2+ left femoral, posterior tibial and dorsalis  pedis pulses; no subclavian or femoral bruits Neurological: grossly nonfocal Psych: euthymic mood, full affect   EKG:  EKG is ordered today. The ekg ordered today demonstrates NSR, QTc 440 ms   Recent Labs: 04/19/2014: B Natriuretic Peptide 45.1; BUN 27*; Creatinine, Ser 1.32; Hemoglobin 14.8; Platelets 183; Potassium 4.6; Sodium 140    Lipid Panel    Component Value Date/Time   CHOL 122 03/25/2013 0312   TRIG 104 03/25/2013 0312   HDL 52 03/25/2013 0312   CHOLHDL 2.3 03/25/2013 0312   VLDL 21 03/25/2013 0312   LDLCALC 49 03/25/2013 0312      Wt Readings from Last 3 Encounters:  10/25/14 186 lb 8 oz (84.596 kg)  04/28/14 193 lb 12.8 oz (87.907 kg)  11/30/13 193 lb 12.8 oz (87.907 kg)     ASSESSMENT AND PLAN:  CAD (coronary artery disease), hx CABG 2003 No evidence to support new  acute coronary event.  Syncope and reported NSVT No arrhythmia since loop recorder implantation. One year after amiodarone was reduced to 100 mg daily, his loop recorder has not shown any arrhythmia. He has not had recurrent syncope. Discontinue amiodarone.  Current medicines are reviewed at length with the patient today.  The patient does not have concerns regarding medicines.   Labs/ tests ordered today include:  Orders Placed This Encounter  Procedures  . EKG 12-Lead      Patient Instructions  Medication Instructions:   STOP AMIODARONE    Follow-Up:  Narda Amber, MD  10/26/2014 4:12 PM    Michael Klein, MD, [facility] [phone] office [phone] pager

## 2014-10-26 ENCOUNTER — Ambulatory Visit (INDEPENDENT_AMBULATORY_CARE_PROVIDER_SITE_OTHER): Payer: Medicare Other | Admitting: Podiatry

## 2014-10-26 ENCOUNTER — Encounter: Payer: Self-pay | Admitting: Cardiovascular Disease

## 2014-10-26 ENCOUNTER — Encounter: Payer: Self-pay | Admitting: Podiatry

## 2014-10-26 DIAGNOSIS — M79673 Pain in unspecified foot: Secondary | ICD-10-CM

## 2014-10-26 DIAGNOSIS — B351 Tinea unguium: Secondary | ICD-10-CM | POA: Diagnosis not present

## 2014-10-26 NOTE — Progress Notes (Signed)
Patient ID: Michael Bryant, male   DOB: Feb 14, 1934, 81 y.o.   MRN: [id number] Complaint:  Visit Type: Patient returns to my office for continued preventative foot care services. Complaint: Patient states" my nails have grown long and thick and become painful to walk and wear his shoes. . The patient presents for preventative foot care services. No changes to ROS  Podiatric Exam: Vascular: dorsalis pedis and posterior tibial pulses are palpable bilateral. Capillary return is immediate. Temperature gradient is WNL. Skin turgor WNL  Sensorium: Normal Semmes Weinstein monofilament test. Normal tactile sensation bilaterally. Nail Exam: Pt has thick disfigured discolored nails with subungual debris noted bilateral entire nail hallux through fifth toenails Ulcer Exam: There is no evidence of ulcer or pre-ulcerative changes or infection. Orthopedic Exam: Muscle tone and strength are WNL. No limitations in general ROM. No crepitus or effusions noted. Foot type and digits show no abnormalities. Bony prominences are unremarkable. Skin: No Porokeratosis. No infection or ulcers  Diagnosis:  Onychomycosis, , Pain in right toe, pain in left toes  Treatment & Plan Procedures and Treatment: Consent by patient was obtained for treatment procedures. The patient understood the discussion of treatment and procedures well. All questions were answered thoroughly reviewed. Debridement of mycotic and hypertrophic toenails, 1 through 5 bilateral and clearing of subungual debris. No ulceration, no infection noted.  Return Visit-Office Procedure: Patient instructed to return to the office for a follow up visit 3 months for continued evaluation and treatment.

## 2014-10-28 ENCOUNTER — Encounter: Payer: Self-pay | Admitting: Cardiovascular Disease

## 2014-10-29 LAB — CUP PACEART INCLINIC DEVICE CHECK
MDC IDC SESS DTM: 20160815133142
MDC IDC SET ZONE DETECTION INTERVAL: 390 ms
Zone Setting Detection Interval: 2000 ms
Zone Setting Detection Interval: 3000 ms

## 2014-11-08 ENCOUNTER — Ambulatory Visit (INDEPENDENT_AMBULATORY_CARE_PROVIDER_SITE_OTHER): Payer: Medicare Other | Admitting: *Deleted

## 2014-11-08 DIAGNOSIS — R55 Syncope and collapse: Secondary | ICD-10-CM

## 2014-11-10 NOTE — Progress Notes (Signed)
Loop recorder 

## 2014-11-19 LAB — CUP PACEART REMOTE DEVICE CHECK: Date Time Interrogation Session: 20160909123007

## 2014-11-19 NOTE — Progress Notes (Signed)
Carelink summary report received.  Battery status OK.  Normal device function.  No new symptom episodes, tachy episodes, brady, or pause episodes.  No new AF episodes.  Monthly summary reports and ROV with [facility] in 10/2015. 

## 2014-11-22 ENCOUNTER — Encounter: Payer: Self-pay | Admitting: Cardiovascular Disease

## 2014-12-07 ENCOUNTER — Ambulatory Visit (INDEPENDENT_AMBULATORY_CARE_PROVIDER_SITE_OTHER): Payer: Medicare Other | Admitting: *Deleted

## 2014-12-07 DIAGNOSIS — R55 Syncope and collapse: Secondary | ICD-10-CM | POA: Diagnosis not present

## 2014-12-09 NOTE — Progress Notes (Signed)
Loop recorder 

## 2014-12-13 ENCOUNTER — Encounter: Payer: Self-pay | Admitting: Cardiovascular Disease

## 2014-12-15 LAB — CUP PACEART REMOTE DEVICE CHECK: Date Time Interrogation Session: 20160927173530

## 2014-12-15 NOTE — Progress Notes (Signed)
Carelink summary report received. Battery status OK. Normal device function. No new symptom episodes, tachy episodes, brady, or pause episodes. No new AF episodes. Monthly summary reports and ROV w/ [facility] 8/17.

## 2014-12-16 ENCOUNTER — Encounter: Payer: Self-pay | Admitting: Cardiology

## 2014-12-29 ENCOUNTER — Other Ambulatory Visit: Payer: Self-pay | Admitting: Cardiovascular Disease

## 2015-01-06 ENCOUNTER — Ambulatory Visit (INDEPENDENT_AMBULATORY_CARE_PROVIDER_SITE_OTHER): Payer: Medicare Other | Admitting: *Deleted

## 2015-01-06 DIAGNOSIS — R55 Syncope and collapse: Secondary | ICD-10-CM | POA: Diagnosis not present

## 2015-01-07 NOTE — Progress Notes (Signed)
Loop recorder 

## 2015-01-18 ENCOUNTER — Encounter: Payer: Self-pay | Admitting: Cardiovascular Disease

## 2015-02-04 LAB — CUP PACEART REMOTE DEVICE CHECK: MDC IDC SESS DTM: 20161027180712

## 2015-02-04 NOTE — Progress Notes (Signed)
Carelink summary report received.  Battery status OK.  Normal device function.  No new symptom episodes, tachy episodes, brady, or pause episodes.  No new AF episodes.  Monthly summary reports and ROV with [facility] in 10/2015. 

## 2015-02-07 ENCOUNTER — Ambulatory Visit (INDEPENDENT_AMBULATORY_CARE_PROVIDER_SITE_OTHER): Payer: Medicare Other | Admitting: *Deleted

## 2015-02-07 DIAGNOSIS — R55 Syncope and collapse: Secondary | ICD-10-CM

## 2015-02-08 ENCOUNTER — Ambulatory Visit (INDEPENDENT_AMBULATORY_CARE_PROVIDER_SITE_OTHER): Payer: Medicare Other | Admitting: Podiatry

## 2015-02-08 ENCOUNTER — Encounter: Payer: Self-pay | Admitting: Podiatry

## 2015-02-08 DIAGNOSIS — B351 Tinea unguium: Secondary | ICD-10-CM | POA: Diagnosis not present

## 2015-02-08 DIAGNOSIS — M79673 Pain in unspecified foot: Secondary | ICD-10-CM | POA: Diagnosis not present

## 2015-02-08 NOTE — Progress Notes (Signed)
Patient ID: Michael Bryant, male   DOB: 07-Apr-1933, 81 y.o.   MRN: [id number] Complaint:  Visit Type: Patient returns to my office for continued preventative foot care services. Complaint: Patient states" my nails have grown long and thick and become painful to walk and wear his shoes. . The patient presents for preventative foot care services. No changes to ROS  Podiatric Exam: Vascular: dorsalis pedis and posterior tibial pulses are palpable bilateral. Capillary return is immediate. Temperature gradient is WNL. Skin turgor WNL  Sensorium: Normal Semmes Weinstein monofilament test. Normal tactile sensation bilaterally. Nail Exam: Pt has thick disfigured discolored nails with subungual debris noted bilateral entire nail hallux through fifth toenails Ulcer Exam: There is no evidence of ulcer or pre-ulcerative changes or infection. Orthopedic Exam: Muscle tone and strength are WNL. No limitations in general ROM. No crepitus or effusions noted. Foot type and digits show no abnormalities. Bony prominences are unremarkable. Skin: No Porokeratosis. No infection or ulcers  Diagnosis:  Onychomycosis, , Pain in right toe, pain in left toes  Treatment & Plan Procedures and Treatment: Consent by patient was obtained for treatment procedures. The patient understood the discussion of treatment and procedures well. All questions were answered thoroughly reviewed. Debridement of mycotic and hypertrophic toenails, 1 through 5 bilateral and clearing of subungual debris. No ulceration, no infection noted.  Return Visit-Office Procedure: Patient instructed to return to the office for a follow up visit 3 months for continued evaluation and treatment.  Gardiner Barefoot DPM

## 2015-02-09 NOTE — Progress Notes (Signed)
LOOP RECORDER  

## 2015-02-22 DIAGNOSIS — R7302 Impaired glucose tolerance (oral): Secondary | ICD-10-CM | POA: Diagnosis not present

## 2015-02-22 DIAGNOSIS — E7800 Pure hypercholesterolemia, unspecified: Secondary | ICD-10-CM | POA: Diagnosis not present

## 2015-02-22 DIAGNOSIS — I10 Essential (primary) hypertension: Secondary | ICD-10-CM | POA: Diagnosis not present

## 2015-03-08 ENCOUNTER — Ambulatory Visit (INDEPENDENT_AMBULATORY_CARE_PROVIDER_SITE_OTHER): Payer: Medicare Other | Admitting: *Deleted

## 2015-03-08 DIAGNOSIS — R55 Syncope and collapse: Secondary | ICD-10-CM

## 2015-03-08 NOTE — Progress Notes (Signed)
Carelink Summary Report / Loop Recorder 

## 2015-03-20 LAB — CUP PACEART REMOTE DEVICE CHECK: Date Time Interrogation Session: 20161126180542

## 2015-04-04 DIAGNOSIS — H18003 Unspecified corneal deposit, bilateral: Secondary | ICD-10-CM | POA: Diagnosis not present

## 2015-04-04 DIAGNOSIS — H11009 Unspecified pterygium of unspecified eye: Secondary | ICD-10-CM | POA: Diagnosis not present

## 2015-04-04 DIAGNOSIS — H43813 Vitreous degeneration, bilateral: Secondary | ICD-10-CM | POA: Diagnosis not present

## 2015-04-04 DIAGNOSIS — H353134 Nonexudative age-related macular degeneration, bilateral, advanced atrophic with subfoveal involvement: Secondary | ICD-10-CM | POA: Diagnosis not present

## 2015-04-06 ENCOUNTER — Ambulatory Visit (INDEPENDENT_AMBULATORY_CARE_PROVIDER_SITE_OTHER): Payer: Medicare Other | Admitting: *Deleted

## 2015-04-06 DIAGNOSIS — R55 Syncope and collapse: Secondary | ICD-10-CM

## 2015-04-06 NOTE — Progress Notes (Signed)
Carelink Summary Report / Loop Recorder 

## 2015-04-16 LAB — CUP PACEART REMOTE DEVICE CHECK: Date Time Interrogation Session: 20161226180743

## 2015-05-06 ENCOUNTER — Ambulatory Visit (INDEPENDENT_AMBULATORY_CARE_PROVIDER_SITE_OTHER): Payer: Medicare Other | Admitting: *Deleted

## 2015-05-06 DIAGNOSIS — R55 Syncope and collapse: Secondary | ICD-10-CM

## 2015-05-06 NOTE — Progress Notes (Signed)
Carelink Summary Report / Loop Recorder 

## 2015-05-17 ENCOUNTER — Ambulatory Visit: Payer: Medicare Other | Admitting: Podiatry

## 2015-05-17 LAB — CUP PACEART REMOTE DEVICE CHECK: Date Time Interrogation Session: 20170125183627

## 2015-05-17 NOTE — Progress Notes (Signed)
Carelink summary report received. Battery status OK. Normal device function. No new symptom episodes, tachy episodes, brady, or pause episodes. No new AF episodes. Monthly summary reports and ROV/PRN 

## 2015-05-20 ENCOUNTER — Ambulatory Visit (INDEPENDENT_AMBULATORY_CARE_PROVIDER_SITE_OTHER): Payer: Medicare Other | Admitting: Podiatry

## 2015-05-20 DIAGNOSIS — B351 Tinea unguium: Secondary | ICD-10-CM

## 2015-05-20 DIAGNOSIS — M79673 Pain in unspecified foot: Secondary | ICD-10-CM | POA: Diagnosis not present

## 2015-05-20 NOTE — Progress Notes (Signed)
Patient ID: Michael Bryant, male   DOB: August 18, 1933, 82 y.o.   MRN: [id number] Complaint:  Visit Type: Patient returns to my office for continued preventative foot care services. Complaint: Patient states" my nails have grown long and thick and become painful to walk and wear his shoes. . The patient presents for preventative foot care services. No changes to ROS  Podiatric Exam: Vascular: dorsalis pedis and posterior tibial pulses are palpable bilateral. Capillary return is immediate. Temperature gradient is WNL. Skin turgor WNL  Sensorium: Normal Semmes Weinstein monofilament test. Normal tactile sensation bilaterally. Nail Exam: Pt has thick disfigured discolored nails with subungual debris noted bilateral entire nail hallux through fifth toenails Ulcer Exam: There is no evidence of ulcer or pre-ulcerative changes or infection. Orthopedic Exam: Muscle tone and strength are WNL. No limitations in general ROM. No crepitus or effusions noted. Foot type and digits show no abnormalities. Bony prominences are unremarkable. Skin: No Porokeratosis. No infection or ulcers  Diagnosis:  Onychomycosis, , Pain in right toe, pain in left toes  Treatment & Plan Procedures and Treatment: Consent by patient was obtained for treatment procedures. The patient understood the discussion of treatment and procedures well. All questions were answered thoroughly reviewed. Debridement of mycotic and hypertrophic toenails, 1 through 5 bilateral and clearing of subungual debris. No ulceration, no infection noted.  Return Visit-Office Procedure: Patient instructed to return to the office for a follow up visit 3 months for continued evaluation and treatment.  Gardiner Barefoot DPM

## 2015-05-23 DIAGNOSIS — I10 Essential (primary) hypertension: Secondary | ICD-10-CM | POA: Diagnosis not present

## 2015-05-23 DIAGNOSIS — E7800 Pure hypercholesterolemia, unspecified: Secondary | ICD-10-CM | POA: Diagnosis not present

## 2015-05-23 DIAGNOSIS — R7302 Impaired glucose tolerance (oral): Secondary | ICD-10-CM | POA: Diagnosis not present

## 2015-05-23 LAB — CUP PACEART REMOTE DEVICE CHECK: Date Time Interrogation Session: 20170224190757

## 2015-05-23 NOTE — Progress Notes (Signed)
Carelink summary report received. Battery status OK. Normal device function. No new symptom episodes, tachy episodes, brady, or pause episodes. No new AF episodes. Monthly summary reports and ROV/PRN 

## 2015-05-30 DIAGNOSIS — R635 Abnormal weight gain: Secondary | ICD-10-CM | POA: Diagnosis not present

## 2015-05-30 DIAGNOSIS — Z23 Encounter for immunization: Secondary | ICD-10-CM | POA: Diagnosis not present

## 2015-05-30 DIAGNOSIS — E7800 Pure hypercholesterolemia, unspecified: Secondary | ICD-10-CM | POA: Diagnosis not present

## 2015-05-30 DIAGNOSIS — I10 Essential (primary) hypertension: Secondary | ICD-10-CM | POA: Diagnosis not present

## 2015-05-30 DIAGNOSIS — E119 Type 2 diabetes mellitus without complications: Secondary | ICD-10-CM | POA: Diagnosis not present

## 2015-06-06 ENCOUNTER — Ambulatory Visit (INDEPENDENT_AMBULATORY_CARE_PROVIDER_SITE_OTHER): Payer: Medicare Other | Admitting: *Deleted

## 2015-06-06 DIAGNOSIS — R55 Syncope and collapse: Secondary | ICD-10-CM

## 2015-06-06 NOTE — Progress Notes (Signed)
Carelink Summary Report / Loop Recorder 

## 2015-07-05 ENCOUNTER — Ambulatory Visit (INDEPENDENT_AMBULATORY_CARE_PROVIDER_SITE_OTHER): Payer: Medicare Other | Admitting: *Deleted

## 2015-07-05 DIAGNOSIS — R55 Syncope and collapse: Secondary | ICD-10-CM

## 2015-07-06 NOTE — Progress Notes (Signed)
Carelink Summary Report / Loop Recorder 

## 2015-07-08 ENCOUNTER — Telehealth: Payer: Self-pay | Admitting: Cardiology

## 2015-07-08 NOTE — Telephone Encounter (Signed)
Spoke w/ pt and requested that he send a manual transmission b/c his home monitor has not updated in at least 14 days.   

## 2015-07-15 ENCOUNTER — Encounter: Payer: Self-pay | Admitting: Cardiology

## 2015-07-21 ENCOUNTER — Telehealth: Payer: Self-pay | Admitting: Cardiovascular Disease

## 2015-07-21 NOTE — Telephone Encounter (Signed)
New Message  Pt wanted to know if the remote signal he sent yesterday was received . Please call back and discuss.

## 2015-07-22 NOTE — Telephone Encounter (Signed)
LMOVM for pt to return call 

## 2015-07-25 NOTE — Telephone Encounter (Signed)
Spoke w/ pt wife and explained to her that pt home monitor has not sent a transmission since 06-25-15. Explained to her that there can be many reasons why the home monitor is not updating on a daily basis. Pt wife is going to call tech services.

## 2015-07-27 ENCOUNTER — Encounter: Payer: Self-pay | Admitting: Internal Medicine

## 2015-07-27 ENCOUNTER — Telehealth: Payer: Self-pay | Admitting: *Deleted

## 2015-07-27 NOTE — Telephone Encounter (Signed)
Called patient regarding tachy episodes (from 07/01/15) from [place] transmission sent on 07/27/15.  Patient initially states he was asymptomatic with the episodes, but during our second call he remembered that he felt dizzy for ~30 minutes and rested.  This occurred after doing yard work for ~5 hours.  ECG shows episodes of wide-complex tachycardia consistent with VT.  Reviewed strips with Dr. Lovena Le, who recommended that patient follow-up ASAP with an EP and to call 911/proceed to ED if recurrent syncope.  Patient is agreeable to appointment with Dr. Lovena Le on 07/28/15 at 4:00pm.  Advised patient that Dr. Lovena Le also recommended that he have a 2D echo, and recommended that he restart amiodarone 200mg  BID.  Patient prefers to wait until his appointment tomorrow to discuss these recommendations.  Made patient's wife aware of instructions to proceed to ED if recurrent syncope.  Patient's wife aware of clinic address and phone number.  Patient and wife verbalize understanding of all instructions and are appreciative of call.

## 2015-07-28 ENCOUNTER — Encounter: Payer: Self-pay | Admitting: Internal Medicine

## 2015-07-28 ENCOUNTER — Ambulatory Visit (INDEPENDENT_AMBULATORY_CARE_PROVIDER_SITE_OTHER): Payer: Medicare Other | Admitting: Internal Medicine

## 2015-07-28 VITALS — BP 142/84 | HR 57 | Ht 70.5 in | Wt 189.8 lb

## 2015-07-28 DIAGNOSIS — I472 Ventricular tachycardia: Secondary | ICD-10-CM | POA: Diagnosis not present

## 2015-07-28 DIAGNOSIS — I2510 Atherosclerotic heart disease of native coronary artery without angina pectoris: Secondary | ICD-10-CM | POA: Diagnosis not present

## 2015-07-28 DIAGNOSIS — I4729 Other ventricular tachycardia: Secondary | ICD-10-CM

## 2015-07-28 NOTE — Progress Notes (Signed)
HPI [name] is referred today by Dr. Sallyanne Bryant for evaluation of VT and near syncope. He is stoic and downplays his symptoms. The patient has CAD, and is s/p CABG. Previously he has had preserved LV function. The patient has a remote h/o syncope for which his ILR was placed. He was noted to have VT (most likely) and placed on amio but this was stopped about a year ago. His cardiac monitor demonstrated VT at nearly 200/min a couple of weeks ago and he is referred for eval. The last EF eval we have was 2 years ago. He denies chest pain or sob and is very active, working in his garden.  Allergies  Allergen Reactions  . Codeine Other (See Comments)    "wont go to sleep"     Current Outpatient Prescriptions  Medication Sig Dispense Refill  . aspirin EC 81 MG tablet Take 81 mg by mouth daily.    . beta carotene w/minerals (OCUVITE) tablet Take 1 tablet by mouth daily.    Marland Kitchen CINNAMON PO Take 1,000 mg by mouth daily.     Marland Kitchen DICLOFENAC PO Take 100 mg by mouth daily.     . fish oil-omega-3 fatty acids 1000 MG capsule Take 1 g by mouth at bedtime.     . Glucosamine HCl (GLUCOSAMINE PO) Take 1 tablet by mouth 2 (two) times daily.    . Misc Natural Products (PROSTATE HEALTH PO) Take 1 tablet by mouth daily.    . nitroGLYCERIN (NITROSTAT) 0.4 MG SL tablet Place 1 tablet (0.4 mg total) under the tongue every 5 (five) minutes as needed for chest pain. 25 tablet 2  . omeprazole (PRILOSEC) 10 MG capsule Take 10 mg by mouth daily.    Michael Bryant Glycol-Propyl Glycol (SYSTANE OP) Place 1 drop into both eyes daily as needed (dry eyes).    . simvastatin (ZOCOR) 40 MG tablet Take 40 mg by mouth at bedtime.    . tamsulosin (FLOMAX) 0.4 MG CAPS capsule Take 0.4 mg by mouth daily after supper.     . trandolapril (MAVIK) 2 MG tablet TAKE 1 TABLET BY MOUTH EVERY DAY 30 tablet 10   No current facility-administered medications for this visit.     Past Medical History  Diagnosis Date  . CAD (coronary artery  disease)     a. s/p CABG (2003)  . Hyperlipidemia   . NSVT (nonsustained ventricular tachycardia) ([place]) 06/16/2013  . Cardiac arrest ([place]) 2003    ROS:   All systems reviewed and negative except as noted in the HPI.   Past Surgical History  Procedure Laterality Date  . Coronary artery bypass graft  2004    LIMA to LAD, SVG to OM, SVG to RCA  . Tonsillectomy and adenoidectomy    . Cardiac catheterization  12/15/2001    60% proximal LAD narrowing with active mobile thrombosis w/50% sequential areas,50-60% pro  . Nm myocar perf wall motion  12/09/2009    mod. perfusion defect basal inferior and mid inferior regions c/w  infarct/scar, EF 54%  . Left heart catheterization with coronary/graft angiogram N/A 07/02/2013    Procedure: LEFT HEART CATHETERIZATION WITH Beatrix Fetters;  Surgeon: Sanda Klein, MD;  Location: [facility];  Service: Cardiovascular;  Laterality: N/A;     Family History  Problem Relation Age of Onset  . CAD Father 69    Died age 42  . Cancer Mother 44     Social History   Social History  .  Marital Status: Married    Spouse Name: N/A  . Number of Children: 2  . Years of Education: N/A   Occupational History  . Retired    Social History Main Topics  . Smoking status: Former Smoker    Types: Cigarettes  . Smokeless tobacco: Not on file     Comment: Quit 1993  . Alcohol Use: No  . Drug Use: No  . Sexual Activity: Not on file   Other Topics Concern  . Not on file   Social History Narrative   Lives at home with wife.            BP 142/84 mmHg  Pulse 57  Ht 5' 10.5" (1.791 m)  Wt 189 lb 12.8 oz (86.093 kg)  BMI 26.84 kg/m2  Physical Exam:  Well appearing elderly man, NAD HEENT: Unremarkable Neck:  6 cm JVD, no thyromegally Lymphatics:  No adenopathy Back:  No CVA tenderness Lungs:  Clear with no wheezes HEART:  Regular rate rhythm, no murmurs, no rubs, no clicks Abd:  soft, positive bowel sounds, no organomegally, no  rebound, no guarding Ext:  2 plus pulses, no edema, no cyanosis, no clubbing Skin:  No rashes no nodules Neuro:  CN II through XII intact, motor grossly intact  DEVICE  HIs ILR demonstrates NSR with long runs of NSVT at 200/min   Assess/Plan: 1. Symptomatic NSVT - I have discussed the treatment options with the patient and his wife. We need to know his LV function. If he has a reduction in LV function, cath and ICD would most likely be indicated. If his EF is still good, then we will plan to restart amiodarone.  2. CAD - he is active and denies anginal symptoms. Will follow.  3. HTN - his blood pressure is well controlled. He is encouraged to maintain a low sodium diet.

## 2015-07-28 NOTE — Patient Instructions (Signed)
Medication Instructions:  Your physician recommends that you continue on your current medications as directed. Please refer to the Current Medication list given to you today.   Labwork: None ordered   Testing/Procedures: Your physician has requested that you have an echocardiogram. Echocardiography is a painless test that uses sound waves to create images of your heart. It provides your doctor with information about the size and shape of your heart and how well your heart's chambers and valves are working. This procedure takes approximately one hour. There are no restrictions for this procedure.    Follow-Up: Your physician recommends that you schedule a follow-up appointment----will call you with plan after echo   Any Other Special Instructions Will Be Listed Below (If Applicable).     If you need a refill on your cardiac medications before your next appointment, please call your pharmacy.

## 2015-08-04 ENCOUNTER — Ambulatory Visit (INDEPENDENT_AMBULATORY_CARE_PROVIDER_SITE_OTHER): Payer: Medicare Other | Admitting: *Deleted

## 2015-08-04 DIAGNOSIS — R55 Syncope and collapse: Secondary | ICD-10-CM | POA: Diagnosis not present

## 2015-08-04 LAB — CUP PACEART REMOTE DEVICE CHECK: MDC IDC SESS DTM: 20170525193602

## 2015-08-05 NOTE — Progress Notes (Signed)
Carelink Summary Report / Loop Recorder 

## 2015-08-06 LAB — CUP PACEART REMOTE DEVICE CHECK: MDC IDC SESS DTM: 20170326190821

## 2015-08-06 NOTE — Progress Notes (Signed)
Carelink summary report received. Battery status OK. Normal device function. No new symptom episodes, tachy episodes, brady, or pause episodes. No new AF episodes. Monthly summary reports and ROV/PRN 

## 2015-08-08 LAB — CUP PACEART REMOTE DEVICE CHECK: Date Time Interrogation Session: 20170425193808

## 2015-08-08 NOTE — Progress Notes (Signed)
Carelink summary report received. Battery status OK. Normal device function. No new symptom episodes, tachy episodes, brady, or pause episodes. No new AF episodes. Monthly summary reports and ROV/PRN 

## 2015-08-11 ENCOUNTER — Other Ambulatory Visit: Payer: Self-pay

## 2015-08-11 ENCOUNTER — Ambulatory Visit (HOSPITAL_COMMUNITY): Payer: Medicare Other | Attending: Cardiology

## 2015-08-11 DIAGNOSIS — E119 Type 2 diabetes mellitus without complications: Secondary | ICD-10-CM | POA: Diagnosis not present

## 2015-08-11 DIAGNOSIS — I358 Other nonrheumatic aortic valve disorders: Secondary | ICD-10-CM | POA: Diagnosis not present

## 2015-08-11 DIAGNOSIS — Z951 Presence of aortocoronary bypass graft: Secondary | ICD-10-CM | POA: Diagnosis not present

## 2015-08-11 DIAGNOSIS — Z87891 Personal history of nicotine dependence: Secondary | ICD-10-CM | POA: Insufficient documentation

## 2015-08-11 DIAGNOSIS — I472 Ventricular tachycardia: Secondary | ICD-10-CM | POA: Diagnosis not present

## 2015-08-11 DIAGNOSIS — Z8249 Family history of ischemic heart disease and other diseases of the circulatory system: Secondary | ICD-10-CM | POA: Diagnosis not present

## 2015-08-11 DIAGNOSIS — I4729 Other ventricular tachycardia: Secondary | ICD-10-CM

## 2015-08-11 DIAGNOSIS — I517 Cardiomegaly: Secondary | ICD-10-CM | POA: Diagnosis not present

## 2015-08-11 DIAGNOSIS — E785 Hyperlipidemia, unspecified: Secondary | ICD-10-CM | POA: Insufficient documentation

## 2015-08-11 DIAGNOSIS — I2510 Atherosclerotic heart disease of native coronary artery without angina pectoris: Secondary | ICD-10-CM | POA: Diagnosis not present

## 2015-08-11 DIAGNOSIS — I371 Nonrheumatic pulmonary valve insufficiency: Secondary | ICD-10-CM | POA: Diagnosis not present

## 2015-08-22 ENCOUNTER — Telehealth: Payer: Self-pay | Admitting: Internal Medicine

## 2015-08-22 NOTE — Telephone Encounter (Signed)
Pt calling c/o echo results from  08-11-15 -pls call [phone]

## 2015-08-22 NOTE — Telephone Encounter (Signed)
Informed patient of echo results.  

## 2015-08-23 MED ORDER — AMIODARONE HCL 200 MG PO TABS: 100.0000 mg | ORAL_TABLET | Freq: Every day | ORAL | Status: DC

## 2015-08-23 NOTE — Telephone Encounter (Signed)
Discussed with  Dr Lovena Le and as his EF is normal will restart Amiodarone 100 mg daily.  He will keep his follow up with Dr Sallyanne Kuster

## 2015-08-23 NOTE — Addendum Note (Signed)
Addended by: Janan Halter F on: 08/23/2015 03:30 PM   Modules accepted: Orders

## 2015-08-24 ENCOUNTER — Telehealth: Payer: Self-pay | Admitting: Cardiovascular Disease

## 2015-08-24 NOTE — Telephone Encounter (Signed)
Current recommendation is for simva dose to be 20mg  or less, but main issue with interaction is myalgias so if he has tolerated both without incidence in the past fine to do both together. Thanks!

## 2015-08-24 NOTE — Telephone Encounter (Signed)
Advice communicated to patient, who voiced understanding and thanks for the call.

## 2015-08-24 NOTE — Telephone Encounter (Signed)
Pt concerned because he got amiodarone filled at pharmacy and they advised him there was an interaction w/ that and his simvastatin prescription. He wants to know if safe to take or if he needs alternative medication.  Was prescribed the amiodarone to start at 100mg  BID.  I reviewed chart after speaking w/ patient and it appears he has been prescribed both medications concurrently in the past. Attempted to call patient back, phone rings w/ no answer. Will reattempt later.

## 2015-08-24 NOTE — Telephone Encounter (Signed)
Michael Bryant is calling because he wants to know if there is another medication that he can take other than Amiodarone because he can not take it with his Simvastatin. Please call   Thanks

## 2015-08-26 ENCOUNTER — Ambulatory Visit (INDEPENDENT_AMBULATORY_CARE_PROVIDER_SITE_OTHER): Payer: Medicare Other | Admitting: Podiatry

## 2015-08-26 ENCOUNTER — Encounter: Payer: Self-pay | Admitting: Podiatry

## 2015-08-26 DIAGNOSIS — M79673 Pain in unspecified foot: Secondary | ICD-10-CM

## 2015-08-26 DIAGNOSIS — B351 Tinea unguium: Secondary | ICD-10-CM | POA: Diagnosis not present

## 2015-08-26 NOTE — Progress Notes (Signed)
Patient ID: Michael Bryant, male   DOB: 10/05/1933, 82 y.o.   MRN: [id number] Complaint:  Visit Type: Patient returns to my office for continued preventative foot care services. Complaint: Patient states" my nails have grown long and thick and become painful to walk and wear his shoes. . The patient presents for preventative foot care services. No changes to ROS  Podiatric Exam: Vascular: dorsalis pedis and posterior tibial pulses are palpable bilateral. Capillary return is immediate. Temperature gradient is WNL. Skin turgor WNL  Sensorium: Normal Semmes Weinstein monofilament test. Normal tactile sensation bilaterally. Nail Exam: Pt has thick disfigured discolored nails with subungual debris noted bilateral entire nail hallux through fifth toenails Ulcer Exam: There is no evidence of ulcer or pre-ulcerative changes or infection. Orthopedic Exam: Muscle tone and strength are WNL. No limitations in general ROM. No crepitus or effusions noted. Foot type and digits show no abnormalities. Bony prominences are unremarkable. Skin: No Porokeratosis. No infection or ulcers  Diagnosis:  Onychomycosis, , Pain in right toe, pain in left toes  Treatment & Plan Procedures and Treatment: Consent by patient was obtained for treatment procedures. The patient understood the discussion of treatment and procedures well. All questions were answered thoroughly reviewed. Debridement of mycotic and hypertrophic toenails, 1 through 5 bilateral and clearing of subungual debris. No ulceration, no infection noted.  Return Visit-Office Procedure: Patient instructed to return to the office for a follow up visit 3 months for continued evaluation and treatment.  Alexei Ey DPM 

## 2015-09-05 ENCOUNTER — Ambulatory Visit (INDEPENDENT_AMBULATORY_CARE_PROVIDER_SITE_OTHER): Payer: Medicare Other | Admitting: *Deleted

## 2015-09-05 DIAGNOSIS — R55 Syncope and collapse: Secondary | ICD-10-CM

## 2015-09-05 LAB — CUP PACEART REMOTE DEVICE CHECK: Date Time Interrogation Session: 20170624201051

## 2015-09-05 NOTE — Progress Notes (Signed)
Carelink Summary Report / Loop Recorder 

## 2015-09-13 NOTE — Progress Notes (Signed)
Carelink summary report received. Battery status OK. Normal device function. No new symptom episodes, brady, or pause episodes. No new AF episodes. 4 tachy episodes (07-01-15) previously addressed (saw GT in office, echo and restarted on amiodarone fo likely VT).  Monthly summary reports and ROV/PRN

## 2015-09-22 DIAGNOSIS — Z961 Presence of intraocular lens: Secondary | ICD-10-CM | POA: Diagnosis not present

## 2015-09-22 DIAGNOSIS — H353134 Nonexudative age-related macular degeneration, bilateral, advanced atrophic with subfoveal involvement: Secondary | ICD-10-CM | POA: Diagnosis not present

## 2015-10-03 ENCOUNTER — Ambulatory Visit (INDEPENDENT_AMBULATORY_CARE_PROVIDER_SITE_OTHER): Payer: Medicare Other | Admitting: *Deleted

## 2015-10-03 DIAGNOSIS — R55 Syncope and collapse: Secondary | ICD-10-CM

## 2015-10-04 NOTE — Progress Notes (Signed)
Carelink Summary Report / Loop Recorder 

## 2015-10-12 DIAGNOSIS — K921 Melena: Secondary | ICD-10-CM | POA: Diagnosis not present

## 2015-10-17 DIAGNOSIS — K921 Melena: Secondary | ICD-10-CM | POA: Diagnosis not present

## 2015-10-20 LAB — CUP PACEART REMOTE DEVICE CHECK: Date Time Interrogation Session: 20170724210913

## 2015-10-25 ENCOUNTER — Encounter: Payer: Self-pay | Admitting: Cardiovascular Disease

## 2015-10-25 ENCOUNTER — Ambulatory Visit (INDEPENDENT_AMBULATORY_CARE_PROVIDER_SITE_OTHER): Payer: Medicare Other | Admitting: Cardiovascular Disease

## 2015-10-25 ENCOUNTER — Encounter (INDEPENDENT_AMBULATORY_CARE_PROVIDER_SITE_OTHER): Payer: Self-pay

## 2015-10-25 VITALS — BP 148/74 | HR 60 | Ht 70.5 in | Wt 191.4 lb

## 2015-10-25 DIAGNOSIS — E785 Hyperlipidemia, unspecified: Secondary | ICD-10-CM

## 2015-10-25 DIAGNOSIS — Z79899 Other long term (current) drug therapy: Secondary | ICD-10-CM | POA: Diagnosis not present

## 2015-10-25 DIAGNOSIS — I2510 Atherosclerotic heart disease of native coronary artery without angina pectoris: Secondary | ICD-10-CM

## 2015-10-25 DIAGNOSIS — I4729 Other ventricular tachycardia: Secondary | ICD-10-CM

## 2015-10-25 DIAGNOSIS — I472 Ventricular tachycardia: Secondary | ICD-10-CM

## 2015-10-25 LAB — CUP PACEART INCLINIC DEVICE CHECK: Date Time Interrogation Session: 20170815114935

## 2015-10-25 MED ORDER — SIMVASTATIN 20 MG PO TABS: 20.0000 mg | ORAL_TABLET | Freq: Every day | ORAL | 11 refills | Status: DC

## 2015-10-25 NOTE — Progress Notes (Signed)
Cardiology Office Note    Date:  10/25/2015   ID:  Michael Bryant, DOB [birth year], MRN [id number]  PCP:  [name], MD  Cardiologist:   Sanda Klein, MD   Chief Complaint  Patient presents with  . Follow-up    pacer check no chest pain, no other complaints     History of Present Illness:  Michael Bryant is a 82 y.o. male with a remote history of coronary artery disease presenting as cardiac arrest in 2003 and leading to bypass surgery. He recently had near-syncope related to nonsustained ventricular tachycardia as demonstrated by his implantable loop recorder (in turn this was implanted for syncopal event that occurred roughly 2 years ago). After re-initiation of amiodarone therapy by Dr. Cristopher Peru, he has been essentially asymptomatic. Echo on June 1 shows normal left ventricular systolic function.  Today loop recorder interrogation shows no new episodes of ventricular tachycardia. Several episodes of pseudo-bradycardia are related to PVC in a pattern of bigeminy, with ventricular ectopy undersensing.  He was initially seen for emergency CABG 12/16/2001 with a witnessed VF arrest , significant LAD disease with intraluminal thrombus. He underwent LIMA-LAD, VG-2 OM 1, VG-distal RCA. In 03/2013 he had left arm pain and diaphoresis and was admitted to [facility]. He ruled out for MI and underwent a stress Myoview which was negative for ischemia; EF 60%.In 06/2013 he had an episode of what sounds like full-blown syncope following sexual intercourse. When 911 emergency services arrived was found to have extremely frequent PVCs and NSVT on the monitor. This all happened in [place]. He was prescribed a beta blocker which had been avoided in the past due to symptomatic bradycardia. He was placed on an event monitor and eventually had a loop recorder placed. He underwent cardiac catheterization on 07/02/13 which revealed: "Severe native coronary artery disease, graft dependent. Widely patent bypass grafts.  Normal left ventricular systolic function. It was recommended that he proceed with BB and amiodarone therapy. "   Past Medical History:  Diagnosis Date  . CAD (coronary artery disease)    a. s/p CABG (2003)  . Cardiac arrest ([place]) 2003  . Hyperlipidemia   . NSVT (nonsustained ventricular tachycardia) ([place]) 06/16/2013    Past Surgical History:  Procedure Laterality Date  . CARDIAC CATHETERIZATION  12/15/2001   60% proximal LAD narrowing with active mobile thrombosis w/50% sequential areas,50-60% pro  . CORONARY ARTERY BYPASS GRAFT  2004   LIMA to LAD, SVG to OM, SVG to RCA  . LEFT HEART CATHETERIZATION WITH CORONARY/GRAFT ANGIOGRAM N/A 07/02/2013   Procedure: LEFT HEART CATHETERIZATION WITH Beatrix Fetters;  Surgeon: Sanda Klein, MD;  Location: [facility] CATH [facility];  Service: Cardiovascular;  Laterality: N/A;  . NM MYOCAR PERF WALL MOTION  12/09/2009   mod. perfusion defect basal inferior and mid inferior regions c/w  infarct/scar, EF 54%  . TONSILLECTOMY AND ADENOIDECTOMY      Current Medications: Outpatient Medications Prior to Visit  Medication Sig Dispense Refill  . amiodarone (PACERONE) 200 MG tablet Take 0.5 tablets (100 mg total) by mouth daily. 45 tablet 3  . aspirin EC 81 MG tablet Take 81 mg by mouth daily.    . beta carotene w/minerals (OCUVITE) tablet Take 1 tablet by mouth daily.    Marland Kitchen CINNAMON PO Take 1,000 mg by mouth daily.     Marland Kitchen DICLOFENAC PO Take 100 mg by mouth daily.     . fish oil-omega-3 fatty acids 1000 MG capsule Take 1 g by mouth at bedtime.     Marland Kitchen  Glucosamine HCl (GLUCOSAMINE PO) Take 1 tablet by mouth 2 (two) times daily.    . Misc Natural Products (PROSTATE HEALTH PO) Take 1 tablet by mouth daily.    . nitroGLYCERIN (NITROSTAT) 0.4 MG SL tablet Place 1 tablet (0.4 mg total) under the tongue every 5 (five) minutes as needed for chest pain. 25 tablet 2  . omeprazole (PRILOSEC) 10 MG capsule Take 10 mg by mouth daily.    [name]  (SYSTANE OP) Place 1 drop into both eyes daily as needed (dry eyes).    . tamsulosin (FLOMAX) 0.4 MG CAPS capsule Take 0.4 mg by mouth daily after supper.     . trandolapril (MAVIK) 2 MG tablet TAKE 1 TABLET BY MOUTH EVERY DAY 30 tablet 10  . simvastatin (ZOCOR) 40 MG tablet Take 40 mg by mouth at bedtime.     No facility-administered medications prior to visit.      Allergies:   Codeine   Social History   Social History  . Marital status: Married    Spouse name: N/A  . Number of children: 2  . Years of education: N/A   Occupational History  . Retired    Social History Main Topics  . Smoking status: Former Smoker    Types: Cigarettes  . Smokeless tobacco: None     Comment: Quit 1993  . Alcohol use No  . Drug use: No  . Sexual activity: Not Asked   Other Topics Concern  . None   Social History Narrative   Lives at home with wife.            Family History:  The patient's family history includes CAD (age of onset: 11) in his father; Cancer (age of onset: 66) in his mother.   ROS:   Please see the history of present illness.    ROS All other systems reviewed and are negative.   PHYSICAL EXAM:   VS:  BP (!) 148/74 (BP Location: Left Arm, Patient Position: Sitting, Cuff Size: Normal)   Pulse 60   Ht 5' 10.5" (1.791 m)   Wt 191 lb 6 oz (86.8 kg)   BMI 27.07 kg/m    GEN: Well nourished, well developed, in no acute distress  HEENT: normal  Neck: no JVD, carotid bruits, or masses Cardiac: RRR; no murmurs, rubs, or gallops,no edema  Respiratory:  clear to auscultation bilaterally, normal work of breathing GI: soft, nontender, nondistended, + BS MS: no deformity or atrophy  Skin: warm and dry, no rash Neuro:  Alert and Oriented x 3, Strength and sensation are intact Psych: euthymic mood, full affect  Wt Readings from Last 3 Encounters:  10/25/15 191 lb 6 oz (86.8 kg)  07/28/15 189 lb 12.8 oz (86.1 kg)  10/25/14 186 lb 8 oz (84.6 kg)      Studies/Labs  Reviewed:   EKG:  EKG is ordered today.  The ekg ordered today demonstrates NSR, QTC 432 ms  Recent Labs: No results found for requested labs within last 8760 hours.   Lipid Panel    Component Value Date/Time   CHOL 122 03/25/2013 0312   TRIG 104 03/25/2013 0312   HDL 52 03/25/2013 0312   CHOLHDL 2.3 03/25/2013 0312   VLDL 21 03/25/2013 0312   LDLCALC 49 03/25/2013 0312     ASSESSMENT:    1. Coronary artery disease involving native coronary artery of native heart without angina pectoris   2. NSVT (nonsustained ventricular tachycardia) ([place])   3. Dyslipidemia  4. Medication management      PLAN:  In order of problems listed above:  1. CAD: Angina free, asymptomatic 2. NSVT: Asymptomatic since starting amiodarone. Some episodes of effective bradycardia due to sustained ventricular bigeminy seem to be well tolerated 3. HLP: Reduce dose of simvastatin due to interaction with amiodarone. He reports being intolerant of numerous other statins in the past and prefers not to switch to an alternative agent.  4. Recheck liver function tests and thyroid function studies as well as lipid profile in 6 months.    Medication Adjustments/Labs and Tests Ordered: Current medicines are reviewed at length with the patient today.  Concerns regarding medicines are outlined above.  Medication changes, Labs and Tests ordered today are listed in the Patient Instructions below. Patient Instructions  Medication Instructions: Dr Sallyanne Kuster has recommended making the following medication changes: 1. DECREASE Simvastatin to 20 mg daily  Labwork: Your physician recommends that you return for lab work in 6 months - FASTING  Testing/Procedures: NONE ORDERED  Follow-up: Dr Sallyanne Kuster recommends that you schedule a follow-up appointment in 6 months with a device check. You will receive a reminder letter in the mail two months in advance. If you don't receive a letter, please call our office to schedule  the follow-up appointment.  If you need a refill on your cardiac medications before your next appointment, please call your pharmacy.    Signed, Sanda Klein, MD  10/25/2015 6:16 PM    [facility] [address], [place], [place]  [address] Phone: [phone]; Fax: [phone]

## 2015-10-25 NOTE — Patient Instructions (Signed)
Medication Instructions: Dr Sallyanne Kuster has recommended making the following medication changes: 1. DECREASE Simvastatin to 20 mg daily  Labwork: Your physician recommends that you return for lab work in 6 months - FASTING  Testing/Procedures: NONE ORDERED  Follow-up: Dr Sallyanne Kuster recommends that you schedule a follow-up appointment in 6 months with a device check. You will receive a reminder letter in the mail two months in advance. If you don't receive a letter, please call our office to schedule the follow-up appointment.  If you need a refill on your cardiac medications before your next appointment, please call your pharmacy.

## 2015-10-27 ENCOUNTER — Encounter: Payer: Medicare Other | Admitting: Cardiovascular Disease

## 2015-10-28 ENCOUNTER — Encounter: Payer: Self-pay | Admitting: Cardiovascular Disease

## 2015-11-02 ENCOUNTER — Ambulatory Visit (INDEPENDENT_AMBULATORY_CARE_PROVIDER_SITE_OTHER): Payer: Medicare Other | Admitting: *Deleted

## 2015-11-02 DIAGNOSIS — R55 Syncope and collapse: Secondary | ICD-10-CM | POA: Diagnosis not present

## 2015-11-03 NOTE — Progress Notes (Signed)
Carelink Summary Report / Loop Recorder 

## 2015-11-08 ENCOUNTER — Other Ambulatory Visit: Payer: Self-pay

## 2015-11-15 ENCOUNTER — Other Ambulatory Visit: Payer: Self-pay | Admitting: Cardiovascular Disease

## 2015-11-24 DIAGNOSIS — E119 Type 2 diabetes mellitus without complications: Secondary | ICD-10-CM | POA: Diagnosis not present

## 2015-11-24 DIAGNOSIS — R635 Abnormal weight gain: Secondary | ICD-10-CM | POA: Diagnosis not present

## 2015-11-24 DIAGNOSIS — I10 Essential (primary) hypertension: Secondary | ICD-10-CM | POA: Diagnosis not present

## 2015-11-24 DIAGNOSIS — Z125 Encounter for screening for malignant neoplasm of prostate: Secondary | ICD-10-CM | POA: Diagnosis not present

## 2015-11-25 ENCOUNTER — Ambulatory Visit: Payer: Medicare Other | Admitting: Podiatry

## 2015-11-30 DIAGNOSIS — Z0000 Encounter for general adult medical examination without abnormal findings: Secondary | ICD-10-CM | POA: Diagnosis not present

## 2015-11-30 DIAGNOSIS — E7800 Pure hypercholesterolemia, unspecified: Secondary | ICD-10-CM | POA: Diagnosis not present

## 2015-11-30 DIAGNOSIS — R7302 Impaired glucose tolerance (oral): Secondary | ICD-10-CM | POA: Diagnosis not present

## 2015-11-30 DIAGNOSIS — I10 Essential (primary) hypertension: Secondary | ICD-10-CM | POA: Diagnosis not present

## 2015-11-30 LAB — CUP PACEART REMOTE DEVICE CHECK: Date Time Interrogation Session: 20170823211146

## 2015-11-30 IMAGING — DX DG CHEST 2V
2 series · 2 of 2 positions shown · non-contrast
Comparison: 03/24/2013 and 07/07/2009 radiographs.

CLINICAL DATA: Chest pain and fever today. History of CABG. Initial
encounter.

EXAM:
CHEST  2 VIEW

[chest pa]
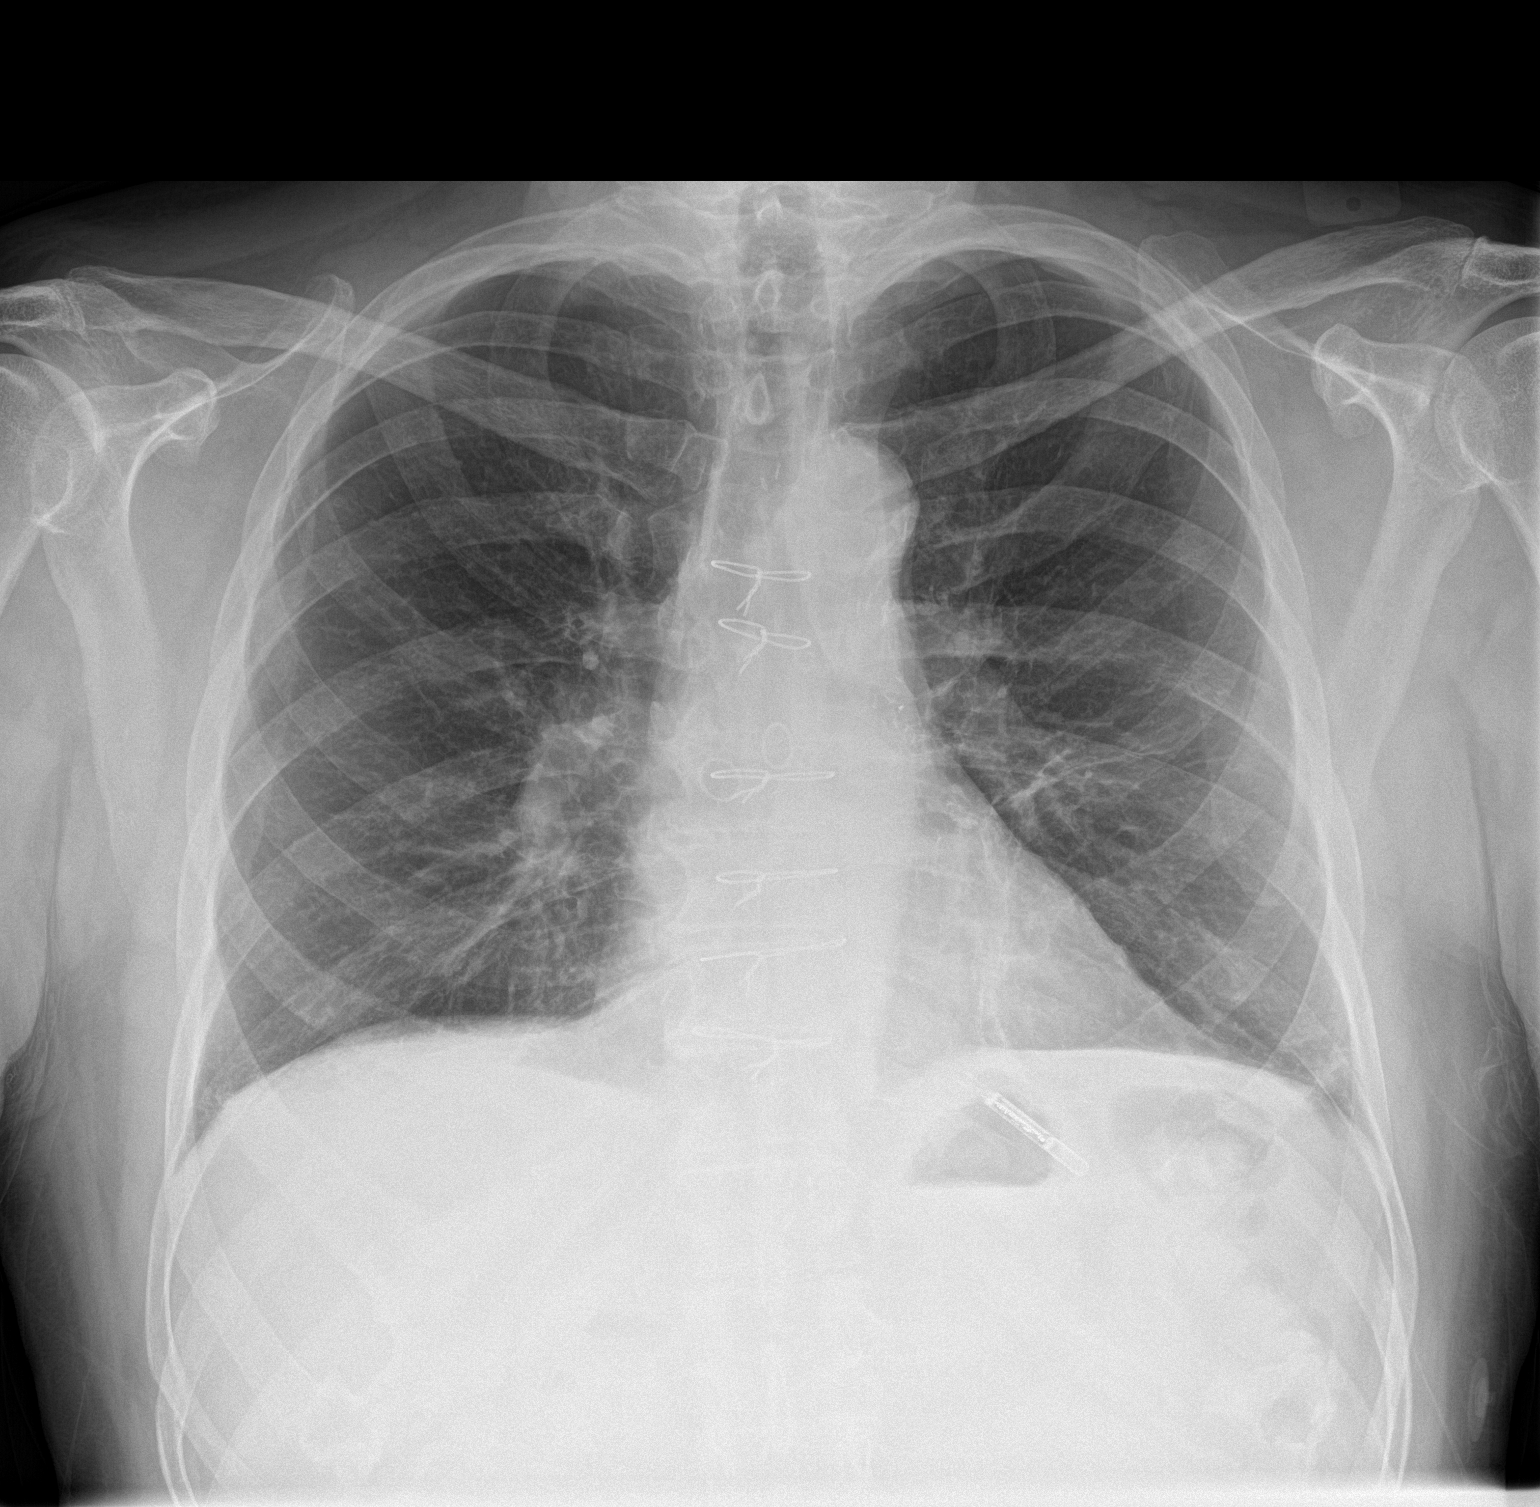

[chest lat]
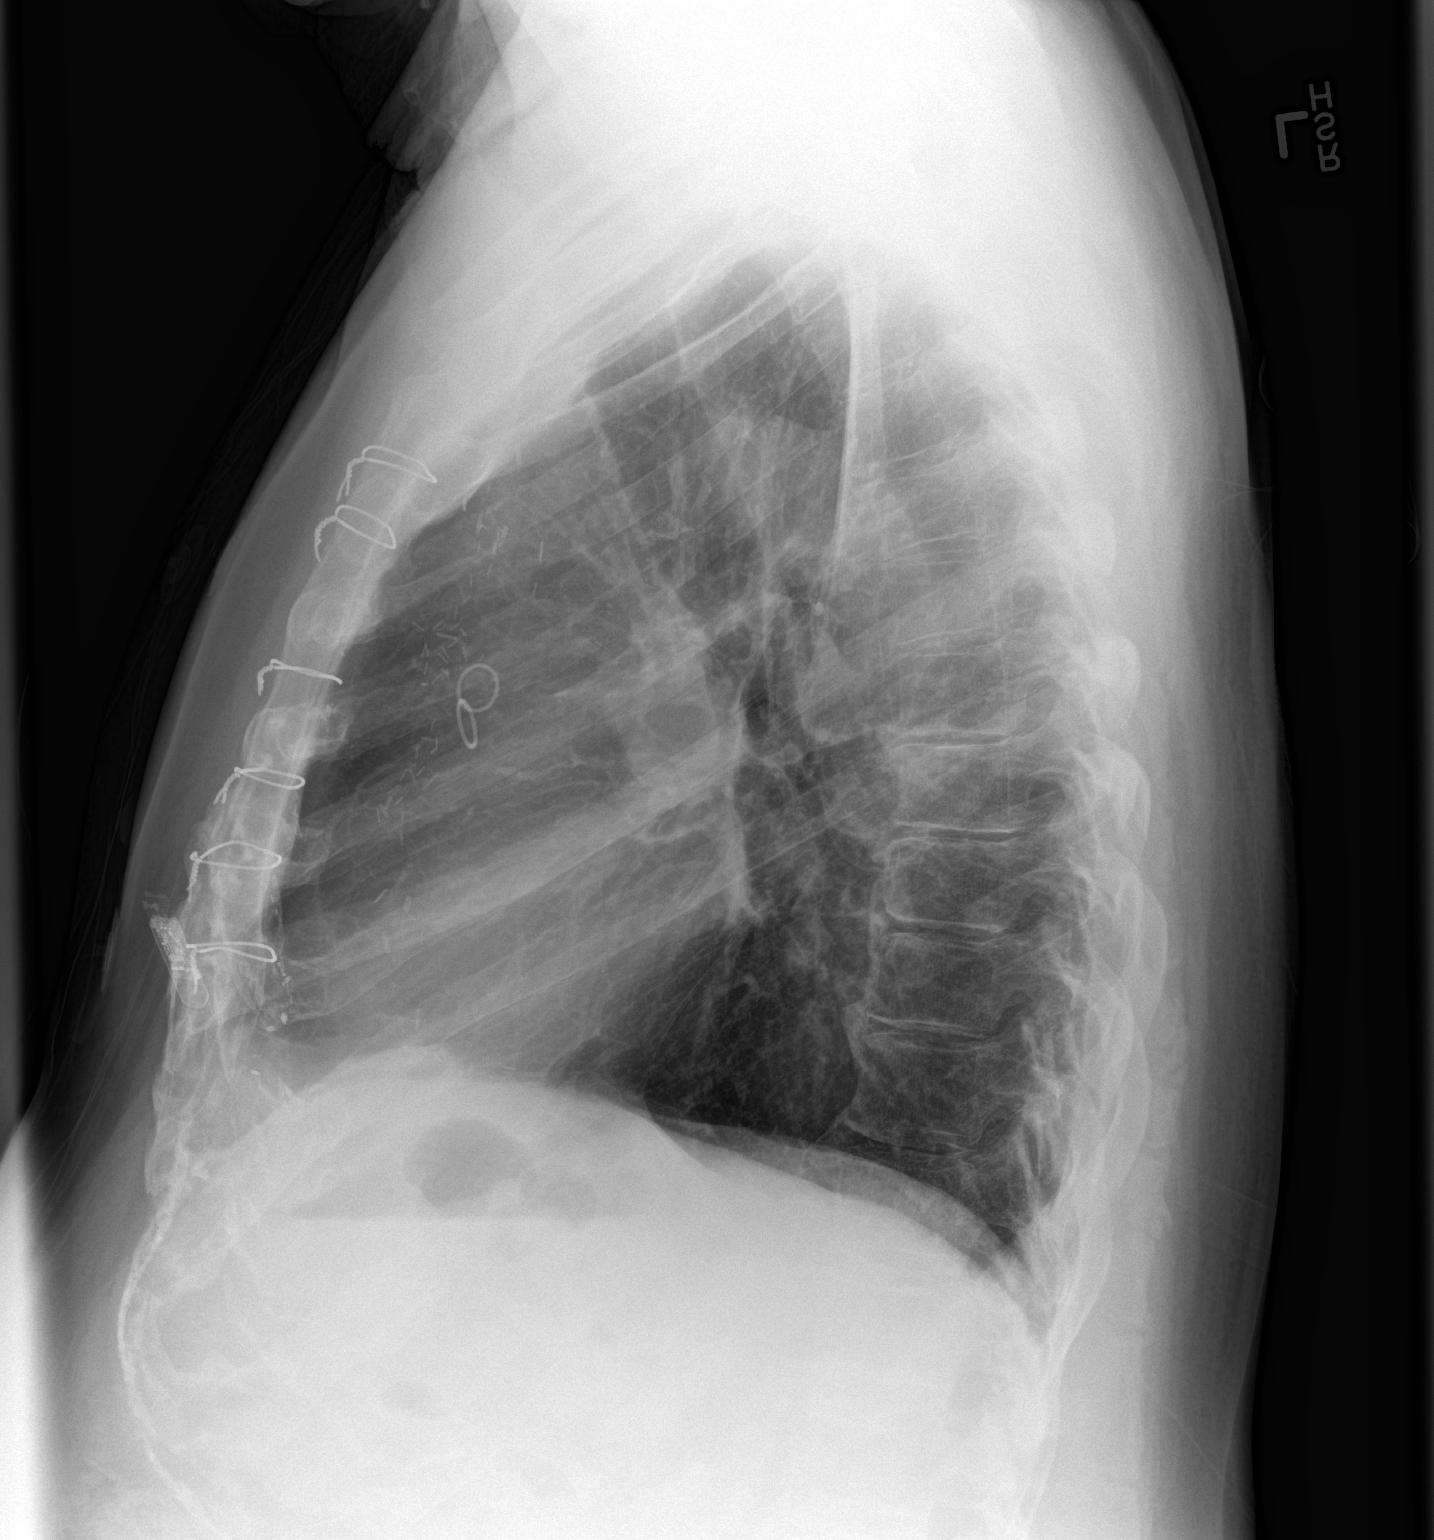

[2 of 2 positions shown; findings below may reference images not displayed]

FINDINGS: The heart size and mediastinal contours are stable status post CABG.
There is stable left infrahilar scarring. No edema, confluent
airspace opacity or pleural effusion demonstrated. The bones appear
unremarkable.
IMPRESSION: Stable postoperative chest.  No acute cardiopulmonary process.

## 2015-11-30 NOTE — Progress Notes (Signed)
Carelink summary report received. Battery status OK. Normal device function. No new symptom episodes, tachy episodes, or pause episodes. No new AF episodes. 6 brady- available ECGs appear undersensing of PVCs. Monthly summary reports and ROV/PRN

## 2015-12-02 ENCOUNTER — Ambulatory Visit (INDEPENDENT_AMBULATORY_CARE_PROVIDER_SITE_OTHER): Payer: Medicare Other | Admitting: *Deleted

## 2015-12-02 ENCOUNTER — Ambulatory Visit: Payer: Medicare Other | Admitting: Podiatry

## 2015-12-02 DIAGNOSIS — R55 Syncope and collapse: Secondary | ICD-10-CM | POA: Diagnosis not present

## 2015-12-05 NOTE — Progress Notes (Signed)
Carelink Summary Report / Loop Recorder 

## 2015-12-13 ENCOUNTER — Encounter: Payer: Self-pay | Admitting: Podiatry

## 2015-12-13 ENCOUNTER — Ambulatory Visit (INDEPENDENT_AMBULATORY_CARE_PROVIDER_SITE_OTHER): Payer: Medicare Other | Admitting: Podiatry

## 2015-12-13 DIAGNOSIS — M79676 Pain in unspecified toe(s): Secondary | ICD-10-CM | POA: Diagnosis not present

## 2015-12-13 DIAGNOSIS — B351 Tinea unguium: Secondary | ICD-10-CM

## 2015-12-13 NOTE — Progress Notes (Signed)
Patient ID: Michael Bryant, male   DOB: 09/20/1933, 82 y.o.   MRN: [id number] Complaint:  Visit Type: Patient returns to my office for continued preventative foot care services. Complaint: Patient states" my nails have grown long and thick and become painful to walk and wear his shoes. . The patient presents for preventative foot care services. No changes to ROS  Podiatric Exam: Vascular: dorsalis pedis and posterior tibial pulses are palpable bilateral. Capillary return is immediate. Temperature gradient is WNL. Skin turgor WNL  Sensorium: Normal Semmes Weinstein monofilament test. Normal tactile sensation bilaterally. Nail Exam: Pt has thick disfigured discolored nails with subungual debris noted bilateral entire nail hallux through fifth toenails Ulcer Exam: There is no evidence of ulcer or pre-ulcerative changes or infection. Orthopedic Exam: Muscle tone and strength are WNL. No limitations in general ROM. No crepitus or effusions noted. Foot type and digits show no abnormalities. Bony prominences are unremarkable. Skin: No Porokeratosis. No infection or ulcers  Diagnosis:  Onychomycosis, , Pain in right toe, pain in left toes  Treatment & Plan Procedures and Treatment: Consent by patient was obtained for treatment procedures. The patient understood the discussion of treatment and procedures well. All questions were answered thoroughly reviewed. Debridement of mycotic and hypertrophic toenails, 1 through 5 bilateral and clearing of subungual debris. No ulceration, no infection noted.  Return Visit-Office Procedure: Patient instructed to return to the office for a follow up visit 3 months for continued evaluation and treatment.  [name] 

## 2015-12-26 DIAGNOSIS — Z125 Encounter for screening for malignant neoplasm of prostate: Secondary | ICD-10-CM | POA: Diagnosis not present

## 2015-12-26 DIAGNOSIS — H9202 Otalgia, left ear: Secondary | ICD-10-CM | POA: Diagnosis not present

## 2016-01-02 ENCOUNTER — Ambulatory Visit (INDEPENDENT_AMBULATORY_CARE_PROVIDER_SITE_OTHER): Payer: Medicare Other | Admitting: *Deleted

## 2016-01-02 DIAGNOSIS — R55 Syncope and collapse: Secondary | ICD-10-CM

## 2016-01-02 NOTE — Progress Notes (Signed)
Carelink Summary Report / Loop Recorder 

## 2016-01-06 LAB — CUP PACEART REMOTE DEVICE CHECK: MDC IDC SESS DTM: 20170922213959

## 2016-01-06 NOTE — Progress Notes (Signed)
Carelink summary report received. Battery status OK. Normal device function. No new symptom episodes, tachy episodes, brady, or pause episodes. No new AF episodes. Monthly summary reports and ROV/PRN 

## 2016-01-13 DIAGNOSIS — R972 Elevated prostate specific antigen [PSA]: Secondary | ICD-10-CM | POA: Diagnosis not present

## 2016-01-28 LAB — CUP PACEART REMOTE DEVICE CHECK
Implantable Pulse Generator Implant Date: 20150609
MDC IDC SESS DTM: 20171022213557

## 2016-01-28 NOTE — Progress Notes (Signed)
Carelink summary report received. Battery status OK. Normal device function. No new symptom episodes, tachy episodes, brady, or pause episodes. No new AF episodes. Monthly summary reports and ROV/PRN 

## 2016-01-31 ENCOUNTER — Ambulatory Visit (INDEPENDENT_AMBULATORY_CARE_PROVIDER_SITE_OTHER): Payer: Medicare Other | Admitting: *Deleted

## 2016-01-31 DIAGNOSIS — R55 Syncope and collapse: Secondary | ICD-10-CM

## 2016-02-01 NOTE — Progress Notes (Signed)
Carelink Summary Report / Loop Recorder 

## 2016-02-28 DIAGNOSIS — M9903 Segmental and somatic dysfunction of lumbar region: Secondary | ICD-10-CM | POA: Diagnosis not present

## 2016-02-28 DIAGNOSIS — M47816 Spondylosis without myelopathy or radiculopathy, lumbar region: Secondary | ICD-10-CM | POA: Diagnosis not present

## 2016-02-29 DIAGNOSIS — M47816 Spondylosis without myelopathy or radiculopathy, lumbar region: Secondary | ICD-10-CM | POA: Diagnosis not present

## 2016-02-29 DIAGNOSIS — M9903 Segmental and somatic dysfunction of lumbar region: Secondary | ICD-10-CM | POA: Diagnosis not present

## 2016-03-01 ENCOUNTER — Ambulatory Visit (INDEPENDENT_AMBULATORY_CARE_PROVIDER_SITE_OTHER): Payer: Medicare Other | Admitting: *Deleted

## 2016-03-01 DIAGNOSIS — M9903 Segmental and somatic dysfunction of lumbar region: Secondary | ICD-10-CM | POA: Diagnosis not present

## 2016-03-01 DIAGNOSIS — M47816 Spondylosis without myelopathy or radiculopathy, lumbar region: Secondary | ICD-10-CM | POA: Diagnosis not present

## 2016-03-01 DIAGNOSIS — R55 Syncope and collapse: Secondary | ICD-10-CM

## 2016-03-02 NOTE — Progress Notes (Signed)
Carelink Summary Report / Loop Recorder 

## 2016-03-13 DIAGNOSIS — M47816 Spondylosis without myelopathy or radiculopathy, lumbar region: Secondary | ICD-10-CM | POA: Diagnosis not present

## 2016-03-13 DIAGNOSIS — M9903 Segmental and somatic dysfunction of lumbar region: Secondary | ICD-10-CM | POA: Diagnosis not present

## 2016-03-14 DIAGNOSIS — M9903 Segmental and somatic dysfunction of lumbar region: Secondary | ICD-10-CM | POA: Diagnosis not present

## 2016-03-14 DIAGNOSIS — M47816 Spondylosis without myelopathy or radiculopathy, lumbar region: Secondary | ICD-10-CM | POA: Diagnosis not present

## 2016-03-15 LAB — CUP PACEART REMOTE DEVICE CHECK
Implantable Pulse Generator Implant Date: 20150609
MDC IDC SESS DTM: 20171121223828

## 2016-03-15 NOTE — Progress Notes (Signed)
Carelink summary report received. Battery status OK. Normal device function. No new symptom episodes, tachy episodes, brady, or pause episodes. No new AF episodes. Monthly summary reports and ROV/PRN 

## 2016-03-19 DIAGNOSIS — M47816 Spondylosis without myelopathy or radiculopathy, lumbar region: Secondary | ICD-10-CM | POA: Diagnosis not present

## 2016-03-19 DIAGNOSIS — M9903 Segmental and somatic dysfunction of lumbar region: Secondary | ICD-10-CM | POA: Diagnosis not present

## 2016-03-20 ENCOUNTER — Encounter: Payer: Self-pay | Admitting: Podiatry

## 2016-03-20 ENCOUNTER — Ambulatory Visit (INDEPENDENT_AMBULATORY_CARE_PROVIDER_SITE_OTHER): Payer: Medicare Other | Admitting: Podiatry

## 2016-03-20 VITALS — Ht 70.5 in | Wt 195.0 lb

## 2016-03-20 DIAGNOSIS — B351 Tinea unguium: Secondary | ICD-10-CM | POA: Diagnosis not present

## 2016-03-20 NOTE — Progress Notes (Signed)
Patient ID: Michael Bryant, male   DOB: 12/22/1933, 82 y.o.   MRN: [id number] Complaint:  Visit Type: Patient returns to my office for continued preventative foot care services. Complaint: Patient states" my nails have grown long and thick and become painful to walk and wear his shoes. . The patient presents for preventative foot care services. No changes to ROS  Podiatric Exam: Vascular: dorsalis pedis and posterior tibial pulses are palpable bilateral. Capillary return is immediate. Temperature gradient is WNL. Skin turgor WNL  Sensorium: Normal Semmes Weinstein monofilament test. Normal tactile sensation bilaterally. Nail Exam: Pt has thick disfigured discolored nails with subungual debris noted bilateral entire nail hallux through fifth toenails Ulcer Exam: There is no evidence of ulcer or pre-ulcerative changes or infection. Orthopedic Exam: Muscle tone and strength are WNL. No limitations in general ROM. No crepitus or effusions noted. Foot type and digits show no abnormalities. Bony prominences are unremarkable. Skin: No Porokeratosis. No infection or ulcers  Diagnosis:  Onychomycosis, , Pain in right toe, pain in left toes  Treatment & Plan Procedures and Treatment: Consent by patient was obtained for treatment procedures. The patient understood the discussion of treatment and procedures well. All questions were answered thoroughly reviewed. Debridement of mycotic and hypertrophic toenails, 1 through 5 bilateral and clearing of subungual debris. No ulceration, no infection noted.  Return Visit-Office Procedure: Patient instructed to return to the office for a follow up visit 3 months for continued evaluation and treatment.  Curtis Cain DPM 

## 2016-03-22 DIAGNOSIS — M9903 Segmental and somatic dysfunction of lumbar region: Secondary | ICD-10-CM | POA: Diagnosis not present

## 2016-03-22 DIAGNOSIS — M47816 Spondylosis without myelopathy or radiculopathy, lumbar region: Secondary | ICD-10-CM | POA: Diagnosis not present

## 2016-03-26 DIAGNOSIS — M9903 Segmental and somatic dysfunction of lumbar region: Secondary | ICD-10-CM | POA: Diagnosis not present

## 2016-03-26 DIAGNOSIS — M47816 Spondylosis without myelopathy or radiculopathy, lumbar region: Secondary | ICD-10-CM | POA: Diagnosis not present

## 2016-04-02 ENCOUNTER — Ambulatory Visit (INDEPENDENT_AMBULATORY_CARE_PROVIDER_SITE_OTHER): Payer: Medicare Other | Admitting: *Deleted

## 2016-04-02 DIAGNOSIS — M47816 Spondylosis without myelopathy or radiculopathy, lumbar region: Secondary | ICD-10-CM | POA: Diagnosis not present

## 2016-04-02 DIAGNOSIS — H353133 Nonexudative age-related macular degeneration, bilateral, advanced atrophic without subfoveal involvement: Secondary | ICD-10-CM | POA: Diagnosis not present

## 2016-04-02 DIAGNOSIS — R55 Syncope and collapse: Secondary | ICD-10-CM | POA: Diagnosis not present

## 2016-04-02 DIAGNOSIS — M9903 Segmental and somatic dysfunction of lumbar region: Secondary | ICD-10-CM | POA: Diagnosis not present

## 2016-04-02 NOTE — Progress Notes (Signed)
Carelink Summary Report / Loop Recorder 

## 2016-04-05 DIAGNOSIS — M9903 Segmental and somatic dysfunction of lumbar region: Secondary | ICD-10-CM | POA: Diagnosis not present

## 2016-04-05 DIAGNOSIS — M47816 Spondylosis without myelopathy or radiculopathy, lumbar region: Secondary | ICD-10-CM | POA: Diagnosis not present

## 2016-04-09 DIAGNOSIS — M9903 Segmental and somatic dysfunction of lumbar region: Secondary | ICD-10-CM | POA: Diagnosis not present

## 2016-04-09 DIAGNOSIS — M47816 Spondylosis without myelopathy or radiculopathy, lumbar region: Secondary | ICD-10-CM | POA: Diagnosis not present

## 2016-04-11 DIAGNOSIS — M9903 Segmental and somatic dysfunction of lumbar region: Secondary | ICD-10-CM | POA: Diagnosis not present

## 2016-04-11 DIAGNOSIS — M47816 Spondylosis without myelopathy or radiculopathy, lumbar region: Secondary | ICD-10-CM | POA: Diagnosis not present

## 2016-04-16 DIAGNOSIS — M47816 Spondylosis without myelopathy or radiculopathy, lumbar region: Secondary | ICD-10-CM | POA: Diagnosis not present

## 2016-04-16 DIAGNOSIS — M9903 Segmental and somatic dysfunction of lumbar region: Secondary | ICD-10-CM | POA: Diagnosis not present

## 2016-04-19 LAB — CUP PACEART REMOTE DEVICE CHECK
MDC IDC PG IMPLANT DT: 20150609
MDC IDC SESS DTM: 20171221230959

## 2016-04-30 ENCOUNTER — Ambulatory Visit (INDEPENDENT_AMBULATORY_CARE_PROVIDER_SITE_OTHER): Payer: Medicare Other | Admitting: *Deleted

## 2016-04-30 DIAGNOSIS — R55 Syncope and collapse: Secondary | ICD-10-CM

## 2016-05-01 NOTE — Progress Notes (Signed)
Carelink Summary Report / Loop Recorder 

## 2016-05-04 LAB — CUP PACEART REMOTE DEVICE CHECK
Implantable Pulse Generator Implant Date: 20150609
MDC IDC SESS DTM: 20180219233721

## 2016-05-05 LAB — CUP PACEART REMOTE DEVICE CHECK
Date Time Interrogation Session: 20180120233856
MDC IDC PG IMPLANT DT: 20150609

## 2016-05-17 ENCOUNTER — Other Ambulatory Visit: Payer: Self-pay | Admitting: Cardiovascular Disease

## 2016-05-17 NOTE — Telephone Encounter (Signed)
Rx(s) sent to pharmacy electronically.  

## 2016-05-23 DIAGNOSIS — R7302 Impaired glucose tolerance (oral): Secondary | ICD-10-CM | POA: Diagnosis not present

## 2016-05-23 DIAGNOSIS — Z125 Encounter for screening for malignant neoplasm of prostate: Secondary | ICD-10-CM | POA: Diagnosis not present

## 2016-05-23 DIAGNOSIS — E7800 Pure hypercholesterolemia, unspecified: Secondary | ICD-10-CM | POA: Diagnosis not present

## 2016-05-23 DIAGNOSIS — I10 Essential (primary) hypertension: Secondary | ICD-10-CM | POA: Diagnosis not present

## 2016-05-29 DIAGNOSIS — E119 Type 2 diabetes mellitus without complications: Secondary | ICD-10-CM | POA: Diagnosis not present

## 2016-05-29 DIAGNOSIS — R972 Elevated prostate specific antigen [PSA]: Secondary | ICD-10-CM | POA: Diagnosis not present

## 2016-05-29 DIAGNOSIS — I10 Essential (primary) hypertension: Secondary | ICD-10-CM | POA: Diagnosis not present

## 2016-05-29 DIAGNOSIS — E7800 Pure hypercholesterolemia, unspecified: Secondary | ICD-10-CM | POA: Diagnosis not present

## 2016-05-30 ENCOUNTER — Ambulatory Visit (INDEPENDENT_AMBULATORY_CARE_PROVIDER_SITE_OTHER): Payer: Medicare Other | Admitting: *Deleted

## 2016-05-30 DIAGNOSIS — R55 Syncope and collapse: Secondary | ICD-10-CM

## 2016-05-31 NOTE — Progress Notes (Signed)
Carelink Summary Report / Loop Recorder 

## 2016-06-11 LAB — CUP PACEART REMOTE DEVICE CHECK
Date Time Interrogation Session: 20180322010839
MDC IDC PG IMPLANT DT: 20150609

## 2016-06-19 ENCOUNTER — Encounter: Payer: Self-pay | Admitting: Podiatry

## 2016-06-19 ENCOUNTER — Ambulatory Visit (INDEPENDENT_AMBULATORY_CARE_PROVIDER_SITE_OTHER): Payer: Medicare Other | Admitting: Podiatry

## 2016-06-19 DIAGNOSIS — B351 Tinea unguium: Secondary | ICD-10-CM | POA: Diagnosis not present

## 2016-06-19 NOTE — Progress Notes (Signed)
Patient ID: Michael Bryant, male   DOB: 19-Mar-1933, 83 y.o.   MRN: [id number] Complaint:  Visit Type: Patient returns to my office for continued preventative foot care services. Complaint: Patient states" my nails have grown long and thick and become painful to walk and wear his shoes. . The patient presents for preventative foot care services. No changes to ROS  Podiatric Exam: Vascular: dorsalis pedis and posterior tibial pulses are palpable bilateral. Capillary return is immediate. Temperature gradient is WNL. Skin turgor WNL  Sensorium: Normal Semmes Weinstein monofilament test. Normal tactile sensation bilaterally. Nail Exam: Pt has thick disfigured discolored nails with subungual debris noted bilateral entire nail hallux through fifth toenails Ulcer Exam: There is no evidence of ulcer or pre-ulcerative changes or infection. Orthopedic Exam: Muscle tone and strength are WNL. No limitations in general ROM. No crepitus or effusions noted. Foot type and digits show no abnormalities. Bony prominences are unremarkable. Skin: No Porokeratosis. No infection or ulcers  Diagnosis:  Onychomycosis, , Pain in right toe, pain in left toes  Treatment & Plan Procedures and Treatment: Consent by patient was obtained for treatment procedures. The patient understood the discussion of treatment and procedures well. All questions were answered thoroughly reviewed. Debridement of mycotic and hypertrophic toenails, 1 through 5 bilateral and clearing of subungual debris. No ulceration, no infection noted.  Return Visit-Office Procedure: Patient instructed to return to the office for a follow up visit 3 months for continued evaluation and treatment.  Gardiner Barefoot DPM

## 2016-06-29 ENCOUNTER — Ambulatory Visit (INDEPENDENT_AMBULATORY_CARE_PROVIDER_SITE_OTHER): Payer: Medicare Other | Admitting: *Deleted

## 2016-06-29 DIAGNOSIS — R55 Syncope and collapse: Secondary | ICD-10-CM | POA: Diagnosis not present

## 2016-07-02 NOTE — Progress Notes (Signed)
Carelink Summary Report / Loop Recorder 

## 2016-07-06 LAB — CUP PACEART REMOTE DEVICE CHECK
Date Time Interrogation Session: 20180421003716
Implantable Pulse Generator Implant Date: 20150609

## 2016-07-30 ENCOUNTER — Ambulatory Visit (INDEPENDENT_AMBULATORY_CARE_PROVIDER_SITE_OTHER): Payer: Medicare Other | Admitting: *Deleted

## 2016-07-30 DIAGNOSIS — R55 Syncope and collapse: Secondary | ICD-10-CM | POA: Diagnosis not present

## 2016-07-30 NOTE — Progress Notes (Signed)
Carelink Summary Report / Loop Recorder 

## 2016-08-02 LAB — CUP PACEART REMOTE DEVICE CHECK
Date Time Interrogation Session: 20180521003727
Implantable Pulse Generator Implant Date: 20150609

## 2016-08-28 ENCOUNTER — Ambulatory Visit (INDEPENDENT_AMBULATORY_CARE_PROVIDER_SITE_OTHER): Payer: Medicare Other | Admitting: *Deleted

## 2016-08-28 DIAGNOSIS — R55 Syncope and collapse: Secondary | ICD-10-CM

## 2016-08-30 NOTE — Progress Notes (Signed)
Carelink Summary Report / Loop Recorder 

## 2016-09-05 LAB — CUP PACEART REMOTE DEVICE CHECK
Date Time Interrogation Session: 20180620013913
Implantable Pulse Generator Implant Date: 20150609

## 2016-09-12 ENCOUNTER — Other Ambulatory Visit: Payer: Self-pay | Admitting: Cardiovascular Disease

## 2016-09-13 ENCOUNTER — Telehealth: Payer: Self-pay | Admitting: Cardiology

## 2016-09-13 NOTE — Telephone Encounter (Signed)
Spoke w/ pt wife and requested that pt send a manual transmission b/c his home monitor has not updated in at least 14 days.   

## 2016-09-19 ENCOUNTER — Encounter: Payer: Self-pay | Admitting: Podiatry

## 2016-09-19 ENCOUNTER — Ambulatory Visit (INDEPENDENT_AMBULATORY_CARE_PROVIDER_SITE_OTHER): Payer: Medicare Other | Admitting: Podiatry

## 2016-09-19 DIAGNOSIS — M79676 Pain in unspecified toe(s): Secondary | ICD-10-CM | POA: Diagnosis not present

## 2016-09-19 DIAGNOSIS — B351 Tinea unguium: Secondary | ICD-10-CM | POA: Diagnosis not present

## 2016-09-19 NOTE — Progress Notes (Signed)
Patient ID: Michael Bryant, male   DOB: 01/01/1934, 83 y.o.   MRN: [id number] Complaint:  Visit Type: Patient returns to my office for continued preventative foot care services. Complaint: Patient states" my nails have grown long and thick and become painful to walk and wear his shoes. . The patient presents for preventative foot care services. No changes to ROS  Podiatric Exam: Vascular: dorsalis pedis and posterior tibial pulses are palpable bilateral. Capillary return is immediate. Temperature gradient is WNL. Skin turgor WNL  Sensorium: Normal Semmes Weinstein monofilament test. Normal tactile sensation bilaterally. Nail Exam: Pt has thick disfigured discolored nails with subungual debris noted bilateral entire nail hallux through fifth toenails Ulcer Exam: There is no evidence of ulcer or pre-ulcerative changes or infection. Orthopedic Exam: Muscle tone and strength are WNL. No limitations in general ROM. No crepitus or effusions noted. Foot type and digits show no abnormalities. Bony prominences are unremarkable. Skin: No Porokeratosis. No infection or ulcers  Diagnosis:  Onychomycosis, , Pain in right toe, pain in left toes  Treatment & Plan Procedures and Treatment: Consent by patient was obtained for treatment procedures. The patient understood the discussion of treatment and procedures well. All questions were answered thoroughly reviewed. Debridement of mycotic and hypertrophic toenails, 1 through 5 bilateral and clearing of subungual debris. No ulceration, no infection noted.  Return Visit-Office Procedure: Patient instructed to return to the office for a follow up visit 3 months for continued evaluation and treatment.  Boubacar Lerette DPM 

## 2016-09-27 ENCOUNTER — Ambulatory Visit (INDEPENDENT_AMBULATORY_CARE_PROVIDER_SITE_OTHER): Payer: Medicare Other | Admitting: *Deleted

## 2016-09-27 DIAGNOSIS — R55 Syncope and collapse: Secondary | ICD-10-CM | POA: Diagnosis not present

## 2016-09-27 DIAGNOSIS — Z961 Presence of intraocular lens: Secondary | ICD-10-CM | POA: Diagnosis not present

## 2016-09-27 DIAGNOSIS — H353134 Nonexudative age-related macular degeneration, bilateral, advanced atrophic with subfoveal involvement: Secondary | ICD-10-CM | POA: Diagnosis not present

## 2016-09-28 ENCOUNTER — Other Ambulatory Visit: Payer: Self-pay | Admitting: Internal Medicine

## 2016-09-28 NOTE — Telephone Encounter (Signed)
This is Dr. Croitoru's pt 

## 2016-10-02 NOTE — Progress Notes (Signed)
Carelink Summary Report / Loop Recorder 

## 2016-10-12 ENCOUNTER — Telehealth: Payer: Self-pay | Admitting: Cardiology

## 2016-10-12 LAB — CUP PACEART REMOTE DEVICE CHECK
Implantable Pulse Generator Implant Date: 20150609
MDC IDC SESS DTM: 20180720134820

## 2016-10-12 NOTE — Telephone Encounter (Signed)
LMOVM requesting that pt send manual transmission b/c home monitor has not updated in at least 7 days.    

## 2016-10-29 ENCOUNTER — Ambulatory Visit (INDEPENDENT_AMBULATORY_CARE_PROVIDER_SITE_OTHER): Payer: Medicare Other | Admitting: *Deleted

## 2016-10-29 DIAGNOSIS — R55 Syncope and collapse: Secondary | ICD-10-CM | POA: Diagnosis not present

## 2016-10-30 NOTE — Progress Notes (Signed)
Carelink Summary Report / Loop Recorder 

## 2016-11-04 LAB — CUP PACEART REMOTE DEVICE CHECK
Date Time Interrogation Session: 20180819153913
MDC IDC PG IMPLANT DT: 20150609

## 2016-11-10 ENCOUNTER — Ambulatory Visit: Payer: Medicare Other | Admitting: Emergency Medicine

## 2016-11-16 ENCOUNTER — Other Ambulatory Visit: Payer: Self-pay | Admitting: Cardiovascular Disease

## 2016-11-16 NOTE — Telephone Encounter (Signed)
Rx has been sent to the pharmacy electronically. ° °

## 2016-11-21 ENCOUNTER — Telehealth: Payer: Self-pay | Admitting: *Deleted

## 2016-11-21 DIAGNOSIS — E7800 Pure hypercholesterolemia, unspecified: Secondary | ICD-10-CM | POA: Diagnosis not present

## 2016-11-21 DIAGNOSIS — I10 Essential (primary) hypertension: Secondary | ICD-10-CM | POA: Diagnosis not present

## 2016-11-21 NOTE — Telephone Encounter (Signed)
LMOVM requesting call back to the [facility].  LINQ at RRT as of 11/20/16.  Will advise patient that plan for explant vs leaving in place will be discussed at his upcoming appointment with Dr. Sallyanne Kuster on 11/28/16.  Will order return kit and unenroll from [facility] when patient returns call.

## 2016-11-21 NOTE — Telephone Encounter (Signed)
Patient returned call.  He is agreeable to receiving a return kit shipped to his home address.  Patient is aware that he can discuss various options with Dr. Sallyanne Kuster at upcoming appointment on 9/19.  Patient is appreciative and denies additional questions or concerns at this time.

## 2016-11-27 ENCOUNTER — Encounter: Payer: Medicare Other | Admitting: *Deleted

## 2016-11-28 ENCOUNTER — Ambulatory Visit (INDEPENDENT_AMBULATORY_CARE_PROVIDER_SITE_OTHER): Payer: Medicare Other | Admitting: Podiatry

## 2016-11-28 ENCOUNTER — Encounter: Payer: Self-pay | Admitting: Cardiovascular Disease

## 2016-11-28 ENCOUNTER — Ambulatory Visit (INDEPENDENT_AMBULATORY_CARE_PROVIDER_SITE_OTHER): Payer: Medicare Other | Admitting: Cardiovascular Disease

## 2016-11-28 VITALS — BP 132/78 | HR 61 | Ht 70.5 in | Wt 190.0 lb

## 2016-11-28 DIAGNOSIS — Z4509 Encounter for adjustment and management of other cardiac device: Secondary | ICD-10-CM | POA: Diagnosis not present

## 2016-11-28 DIAGNOSIS — I472 Ventricular tachycardia: Secondary | ICD-10-CM

## 2016-11-28 DIAGNOSIS — I2510 Atherosclerotic heart disease of native coronary artery without angina pectoris: Secondary | ICD-10-CM | POA: Diagnosis not present

## 2016-11-28 DIAGNOSIS — B351 Tinea unguium: Secondary | ICD-10-CM

## 2016-11-28 DIAGNOSIS — I10 Essential (primary) hypertension: Secondary | ICD-10-CM

## 2016-11-28 DIAGNOSIS — M79676 Pain in unspecified toe(s): Secondary | ICD-10-CM

## 2016-11-28 DIAGNOSIS — E7800 Pure hypercholesterolemia, unspecified: Secondary | ICD-10-CM

## 2016-11-28 DIAGNOSIS — Z79899 Other long term (current) drug therapy: Secondary | ICD-10-CM | POA: Diagnosis not present

## 2016-11-28 DIAGNOSIS — I4729 Other ventricular tachycardia: Secondary | ICD-10-CM

## 2016-11-28 NOTE — Progress Notes (Signed)
Cardiology Office Note    Date:  11/28/2016   ID:  Michael Bryant, DOB 1933-03-22, MRN [id number]  PCP:  [name], MD  Cardiologist:   Sanda Klein, MD   Chief Complaint  Patient presents with  . Follow-up    pt denied chest pain and SOB    History of Present Illness:  Michael Bryant is a 83 y.o. male with a remote history of coronary artery disease presenting as cardiac arrest in 2003 and leading to bypass surgery. The loop recorder was implanted roughly 3 years ago when he had a non-syncope, found to be secondary to nonsustained ventricular tachycardia. Amiodarone was started and since then he has been asymptomatic and has been no recurrence of ventricular tachycardia, even when the dose was reduced 200 mg daily. He has normal left ventricular systolic function by echo, last checked June 2017.  His loop recorder has shown a few episodes of pseudo--bradycardia related to episodes of ventricular bigeminy with R-wave under sensing. His device reached RRT on 11/20/2016.  He remains very active for his age. He has a 1 acre fenced in gardening with tomatoes, peas and cantaloupe. He recently though could the deep trench to put in netting to keep rabbits out. He mows a 3 acre lawn, including a large segment where he has to use a push mower because of the slope. Despite all this he denies problems with exertional angina or dyspnea. He has not had syncope and is not aware of palpitations. He denies focal neurological events and has not had leg edema or claudication. He heard that "statins are no good for you" on television.  He was initially seen for emergency CABG 12/16/2001 with a witnessed VF arrest , significant LAD disease with intraluminal thrombus. He underwent LIMA-LAD, VG-2 OM 1, VG-distal RCA.  In 03/2013 he underwent a stress Myoview which was negative for ischemia; EF 60%. In 06/2013 he had syncope following sexual intercourse. When 911 emergency services arrived was found to have  extremely frequent PVCs and NSVT on the monitor.  He underwent cardiac catheterization on 07/02/13 which revealed: "Severe native coronary artery disease, graft dependent. Widely patent bypass grafts. Normal left ventricular systolic function. Beta blockers have been avoided due to bradycardia, but he tolerates low-dose amiodarone.  Past Medical History:  Diagnosis Date  . CAD (coronary artery disease)    a. s/p CABG (2003)  . Cardiac arrest ([place]) 2003  . Hyperlipidemia   . NSVT (nonsustained ventricular tachycardia) ([place]) 06/16/2013    Past Surgical History:  Procedure Laterality Date  . CARDIAC CATHETERIZATION  12/15/2001   60% proximal LAD narrowing with active mobile thrombosis w/50% sequential areas,50-60% pro  . CORONARY ARTERY BYPASS GRAFT  2004   LIMA to LAD, SVG to OM, SVG to RCA  . LEFT HEART CATHETERIZATION WITH CORONARY/GRAFT ANGIOGRAM N/A 07/02/2013   Procedure: LEFT HEART CATHETERIZATION WITH [facility];  Surgeon: Sanda Klein, MD;  Location: [facility];  Service: Cardiovascular;  Laterality: N/A;  . NM MYOCAR PERF WALL MOTION  12/09/2009   mod. perfusion defect basal inferior and mid inferior regions c/w  infarct/scar, EF 54%  . TONSILLECTOMY AND ADENOIDECTOMY      Current Medications: Outpatient Medications Prior to Visit  Medication Sig Dispense Refill  . amiodarone (PACERONE) 200 MG tablet TAKE 1/2 TABLET(100 MG) BY MOUTH DAILY 45 tablet 0  . aspirin EC 81 MG tablet Take 81 mg by mouth daily.    . beta carotene w/minerals (OCUVITE) tablet Take 1  tablet by mouth daily.    Marland Kitchen CINNAMON PO Take 1,000 mg by mouth daily.     Marland Kitchen DICLOFENAC PO Take 100 mg by mouth daily.     . fish oil-omega-3 fatty acids 1000 MG capsule Take 1 g by mouth at bedtime.     . Glucosamine HCl (GLUCOSAMINE PO) Take 1 tablet by mouth 2 (two) times daily.    . Misc Natural Products (PROSTATE HEALTH PO) Take 1 tablet by mouth daily.    . nitroGLYCERIN (NITROSTAT) 0.4 MG SL tablet  Place 1 tablet (0.4 mg total) under the tongue every 5 (five) minutes as needed for chest pain. 25 tablet 2  . omeprazole (PRILOSEC) 10 MG capsule Take 10 mg by mouth daily.    [name] (SYSTANE OP) Place 1 drop into both eyes daily as needed (dry eyes).    . simvastatin (ZOCOR) 20 MG tablet TAKE 1 TABLET(20 MG) BY MOUTH AT BEDTIME 30 tablet 0  . tamsulosin (FLOMAX) 0.4 MG CAPS capsule Take 0.4 mg by mouth daily after supper.     . trandolapril (MAVIK) 2 MG tablet TAKE 1 TABLET BY MOUTH EVERY DAY 30 tablet 0   No facility-administered medications prior to visit.      Allergies:   Codeine   Social History   Social History  . Marital status: Married    Spouse name: N/A  . Number of children: 2  . Years of education: N/A   Occupational History  . Retired    Social History Main Topics  . Smoking status: Former Smoker    Types: Cigarettes  . Smokeless tobacco: Former Systems developer     Comment: Quit 1993  . Alcohol use No  . Drug use: No  . Sexual activity: Not on file   Other Topics Concern  . Not on file   Social History Narrative   Lives at home with wife.            Family History:  The patient's family history includes CAD (age of onset: 65) in his father; Cancer (age of onset: 33) in his mother.   ROS:   Please see the history of present illness.    ROS All other systems reviewed and are negative.   PHYSICAL EXAM:   VS:  BP 132/78   Pulse 61   Ht 5' 10.5" (1.791 m)   Wt 190 lb (86.2 kg)   BMI 26.88 kg/m    GEN: Well nourished, well developed, in no acute distress  HEENT: normal  Neck: no JVD, carotid bruits, or masses Cardiac: RRR; no murmurs, rubs, or gallops,no edema  Respiratory:  clear to auscultation bilaterally, normal work of breathing GI: soft, nontender, nondistended, + BS MS: no deformity or atrophy  Skin: warm and dry, no rash Neuro:  Alert and Oriented x 3, Strength and sensation are intact Psych: euthymic mood, full  affect  Wt Readings from Last 3 Encounters:  11/28/16 190 lb (86.2 kg)  03/20/16 195 lb (88.5 kg)  10/25/15 191 lb 6 oz (86.8 kg)      Studies/Labs Reviewed:   EKG:  EKG is ordered today.  The ekg ordered today demonstrates Normal sinus rhythm, poor R-wave progression V1-V3, normal repolarization pattern, QTC 428 ms Recent Labs: [facility] 11/21/2016 Normal liver function tests, creatinine 1.0, TSH 1.99 05/23/2016 total cholesterol 139, HDL 48, LDL 60  ASSESSMENT:    1. Coronary artery disease involving native coronary artery of native heart without angina pectoris  2. NSVT (nonsustained ventricular tachycardia) ([place])   3. Encounter for loop recorder at end of battery life   4. Hypercholesteremia   5. Long term current use of amiodarone   6. Essential hypertension      PLAN:  In order of problems listed above:  1. CAD: Remains completely asymptomatic, no angina despite a very active lifestyle 2. NSVT: Since starting on amiodarone he has not had syncope and his loop recorder has not shown evidence of ventricular tachycardia.  3. ILR@RRT : The loop recorder has now reached RRT, but I don't think it needs to be replaced. No meaningful bradycardia has been recorded while on amiodarone. 4. HLP: LDL cholesterol target level. We have previously discussed the interaction between amiodarone and simvastatin. The dose of simvastatin is low. He reports being intolerant of numerous other statins in the past and prefers not to switch to an alternative agent.  5. Amiodarone: Recheck liver function tests and thyroid function studies, as well as lipid profile in 6 months. 6. HTN: Excellent blood pressure control on ACE inhibitor monotherapy    Medication Adjustments/Labs and Tests Ordered: Current medicines are reviewed at length with the patient today.  Concerns regarding medicines are outlined above.  Medication changes, Labs and Tests ordered today are listed in the  Patient Instructions below. Patient Instructions  Dr Sallyanne Kuster recommends that you schedule a follow-up appointment in 12 months. You will receive a reminder letter in the mail two months in advance. If you don't receive a letter, please call our office to schedule the follow-up appointment.  If you need a refill on your cardiac medications before your next appointment, please call your pharmacy.    Signed, Sanda Klein, MD  11/28/2016 10:54 AM    [facility] [address], [place], [place]  [address] Phone: [phone]; Fax: [phone]

## 2016-11-28 NOTE — Progress Notes (Signed)
Patient ID: Michael Bryant, male   DOB: 08-10-1933, 83 y.o.   MRN: [id number] Complaint:  Visit Type: Patient returns to my office for continued preventative foot care services. Complaint: Patient states" my nails have grown long and thick and become painful to walk and wear his shoes. . The patient presents for preventative foot care services. No changes to ROS  Podiatric Exam: Vascular: dorsalis pedis and posterior tibial pulses are palpable bilateral. Capillary return is immediate. Temperature gradient is WNL. Skin turgor WNL  Sensorium: Normal Semmes Weinstein monofilament test. Normal tactile sensation bilaterally. Nail Exam: Pt has thick disfigured discolored nails with subungual debris noted bilateral entire nail hallux through fifth toenails Ulcer Exam: There is no evidence of ulcer or pre-ulcerative changes or infection. Orthopedic Exam: Muscle tone and strength are WNL. No limitations in general ROM. No crepitus or effusions noted. Foot type and digits show no abnormalities. Bony prominences are unremarkable. Skin: No Porokeratosis. No infection or ulcers  Diagnosis:  Onychomycosis, , Pain in right toe, pain in left toes  Treatment & Plan Procedures and Treatment: Consent by patient was obtained for treatment procedures. The patient understood the discussion of treatment and procedures well. All questions were answered thoroughly reviewed. Debridement of mycotic and hypertrophic toenails, 1 through 5 bilateral and clearing of subungual debris. No ulceration, no infection noted.  Return Visit-Office Procedure: Patient instructed to return to the office for a follow up visit 3 months for continued evaluation and treatment.  Gardiner Barefoot DPM

## 2016-11-28 NOTE — Patient Instructions (Signed)
Dr Croitoru recommends that you schedule a follow-up appointment in 12 months. You will receive a reminder letter in the mail two months in advance. If you don't receive a letter, please call our office to schedule the follow-up appointment.  If you need a refill on your cardiac medications before your next appointment, please call your pharmacy. 

## 2016-11-29 LAB — CUP PACEART INCLINIC DEVICE CHECK
Date Time Interrogation Session: 20180919140041
MDC IDC PG IMPLANT DT: 20150609

## 2016-11-30 DIAGNOSIS — Z23 Encounter for immunization: Secondary | ICD-10-CM | POA: Diagnosis not present

## 2016-11-30 DIAGNOSIS — E119 Type 2 diabetes mellitus without complications: Secondary | ICD-10-CM | POA: Diagnosis not present

## 2016-11-30 DIAGNOSIS — Z125 Encounter for screening for malignant neoplasm of prostate: Secondary | ICD-10-CM | POA: Diagnosis not present

## 2016-11-30 DIAGNOSIS — Z0000 Encounter for general adult medical examination without abnormal findings: Secondary | ICD-10-CM | POA: Diagnosis not present

## 2016-11-30 DIAGNOSIS — I2510 Atherosclerotic heart disease of native coronary artery without angina pectoris: Secondary | ICD-10-CM | POA: Diagnosis not present

## 2016-12-14 ENCOUNTER — Other Ambulatory Visit: Payer: Self-pay | Admitting: Cardiovascular Disease

## 2016-12-14 NOTE — Telephone Encounter (Signed)
Rx(s) sent to pharmacy electronically.  

## 2016-12-19 ENCOUNTER — Other Ambulatory Visit: Payer: Self-pay | Admitting: Cardiovascular Disease

## 2016-12-19 DIAGNOSIS — R972 Elevated prostate specific antigen [PSA]: Secondary | ICD-10-CM | POA: Diagnosis not present

## 2016-12-24 ENCOUNTER — Other Ambulatory Visit: Payer: Self-pay | Admitting: Cardiovascular Disease

## 2016-12-24 NOTE — Telephone Encounter (Signed)
REFILL 

## 2017-01-02 ENCOUNTER — Other Ambulatory Visit: Payer: Self-pay | Admitting: Cardiovascular Disease

## 2017-01-10 DIAGNOSIS — Z23 Encounter for immunization: Secondary | ICD-10-CM | POA: Diagnosis not present

## 2017-02-27 ENCOUNTER — Ambulatory Visit: Payer: Medicare Other | Admitting: Podiatry

## 2017-03-01 ENCOUNTER — Ambulatory Visit (INDEPENDENT_AMBULATORY_CARE_PROVIDER_SITE_OTHER): Payer: Medicare Other | Admitting: Podiatry

## 2017-03-01 ENCOUNTER — Encounter: Payer: Self-pay | Admitting: Podiatry

## 2017-03-01 DIAGNOSIS — B351 Tinea unguium: Secondary | ICD-10-CM | POA: Diagnosis not present

## 2017-03-01 DIAGNOSIS — M79676 Pain in unspecified toe(s): Secondary | ICD-10-CM

## 2017-03-01 NOTE — Progress Notes (Addendum)
Patient ID: Michael Bryant, male   DOB: 1933-05-15, 83 y.o.   MRN: [id number] Complaint:  Visit Type: Patient returns to my office for continued preventative foot care services. Complaint: Patient states" my nails have grown long and thick and become painful to walk and wear his shoes. . The patient presents for preventative foot care services. No changes to ROS  Podiatric Exam: Vascular: dorsalis pedis and posterior tibial pulses are palpable bilateral. Capillary return is immediate. Temperature gradient is WNL. Skin turgor WNL  Sensorium: Normal Semmes Weinstein monofilament test. Normal tactile sensation bilaterally. Nail Exam: Pt has thick disfigured discolored nails with subungual debris noted bilateral entire nail hallux through fifth toenails Ulcer Exam: There is no evidence of ulcer or pre-ulcerative changes or infection. Orthopedic Exam: Muscle tone and strength are WNL. No limitations in general ROM. No crepitus or effusions noted. Foot type and digits show no abnormalities. Bony prominences are unremarkable. Skin: No Porokeratosis. No infection or ulcers  Diagnosis:  Onychomycosis, , Pain in right toe, pain in left toes  Treatment & Plan Procedures and Treatment: Consent by patient was obtained for treatment procedures. The patient understood the discussion of treatment and procedures well. All questions were answered thoroughly reviewed. Debridement of mycotic and hypertrophic toenails, 1 through 5 bilateral and clearing of subungual debris. No ulceration, no infection noted. ABN signed for 2018 Return Visit-Office Procedure: Patient instructed to return to the office for a follow up visit 3 months for continued evaluation and treatment.  Gardiner Barefoot DPM

## 2017-03-21 DIAGNOSIS — L821 Other seborrheic keratosis: Secondary | ICD-10-CM | POA: Diagnosis not present

## 2017-03-21 DIAGNOSIS — L570 Actinic keratosis: Secondary | ICD-10-CM | POA: Diagnosis not present

## 2017-03-21 DIAGNOSIS — Z85828 Personal history of other malignant neoplasm of skin: Secondary | ICD-10-CM | POA: Diagnosis not present

## 2017-03-21 DIAGNOSIS — L720 Epidermal cyst: Secondary | ICD-10-CM | POA: Diagnosis not present

## 2017-05-31 ENCOUNTER — Ambulatory Visit: Payer: Medicare Other | Admitting: Podiatry

## 2017-05-31 DIAGNOSIS — Z125 Encounter for screening for malignant neoplasm of prostate: Secondary | ICD-10-CM | POA: Diagnosis not present

## 2017-05-31 DIAGNOSIS — E119 Type 2 diabetes mellitus without complications: Secondary | ICD-10-CM | POA: Diagnosis not present

## 2017-05-31 DIAGNOSIS — I2510 Atherosclerotic heart disease of native coronary artery without angina pectoris: Secondary | ICD-10-CM | POA: Diagnosis not present

## 2017-06-07 DIAGNOSIS — E119 Type 2 diabetes mellitus without complications: Secondary | ICD-10-CM | POA: Diagnosis not present

## 2017-06-07 DIAGNOSIS — Z0000 Encounter for general adult medical examination without abnormal findings: Secondary | ICD-10-CM | POA: Diagnosis not present

## 2017-06-07 DIAGNOSIS — I10 Essential (primary) hypertension: Secondary | ICD-10-CM | POA: Diagnosis not present

## 2017-06-07 DIAGNOSIS — E7800 Pure hypercholesterolemia, unspecified: Secondary | ICD-10-CM | POA: Diagnosis not present

## 2017-06-07 DIAGNOSIS — Z125 Encounter for screening for malignant neoplasm of prostate: Secondary | ICD-10-CM | POA: Diagnosis not present

## 2017-06-07 DIAGNOSIS — E559 Vitamin D deficiency, unspecified: Secondary | ICD-10-CM | POA: Diagnosis not present

## 2017-06-19 ENCOUNTER — Encounter: Payer: Self-pay | Admitting: Podiatry

## 2017-06-19 ENCOUNTER — Ambulatory Visit (INDEPENDENT_AMBULATORY_CARE_PROVIDER_SITE_OTHER): Payer: Medicare Other | Admitting: Podiatry

## 2017-06-19 DIAGNOSIS — B351 Tinea unguium: Secondary | ICD-10-CM

## 2017-06-19 DIAGNOSIS — M79676 Pain in unspecified toe(s): Secondary | ICD-10-CM

## 2017-06-19 NOTE — Progress Notes (Signed)
Patient ID: Michael Bryant, male   DOB: 03/07/1934, 84 y.o.   MRN: [id number] Complaint:  Visit Type: Patient returns to my office for continued preventative foot care services. Complaint: Patient states" my nails have grown long and thick and become painful to walk and wear his shoes. . The patient presents for preventative foot care services. No changes to ROS  Podiatric Exam: Vascular: dorsalis pedis and posterior tibial pulses are palpable bilateral. Capillary return is immediate. Temperature gradient is WNL. Skin turgor WNL  Sensorium: Normal Semmes Weinstein monofilament test. Normal tactile sensation bilaterally. Nail Exam: Pt has thick disfigured discolored nails with subungual debris noted bilateral entire nail hallux through fifth toenails Ulcer Exam: There is no evidence of ulcer or pre-ulcerative changes or infection. Orthopedic Exam: Muscle tone and strength are WNL. No limitations in general ROM. No crepitus or effusions noted. Foot type and digits show no abnormalities. Bony prominences are unremarkable. Skin: No Porokeratosis. No infection or ulcers  Diagnosis:  Onychomycosis, , Pain in right toe, pain in left toes  Treatment & Plan Procedures and Treatment: Consent by patient was obtained for treatment procedures. The patient understood the discussion of treatment and procedures well. All questions were answered thoroughly reviewed. Debridement of mycotic and hypertrophic toenails, 1 through 5 bilateral and clearing of subungual debris. No ulceration, no infection noted. ABN signed for 2019 Return Visit-Office Procedure: Patient instructed to return to the office for a follow up visit 3 months for continued evaluation and treatment.  Gardiner Barefoot DPM

## 2017-06-26 DIAGNOSIS — M79622 Pain in left upper arm: Secondary | ICD-10-CM | POA: Diagnosis not present

## 2017-09-17 DIAGNOSIS — J0100 Acute maxillary sinusitis, unspecified: Secondary | ICD-10-CM | POA: Diagnosis not present

## 2017-09-17 DIAGNOSIS — I10 Essential (primary) hypertension: Secondary | ICD-10-CM | POA: Diagnosis not present

## 2017-09-17 DIAGNOSIS — E119 Type 2 diabetes mellitus without complications: Secondary | ICD-10-CM | POA: Diagnosis not present

## 2017-09-17 DIAGNOSIS — H9202 Otalgia, left ear: Secondary | ICD-10-CM | POA: Diagnosis not present

## 2017-09-17 DIAGNOSIS — E7800 Pure hypercholesterolemia, unspecified: Secondary | ICD-10-CM | POA: Diagnosis not present

## 2017-09-18 ENCOUNTER — Encounter: Payer: Self-pay | Admitting: Podiatry

## 2017-09-18 ENCOUNTER — Ambulatory Visit (INDEPENDENT_AMBULATORY_CARE_PROVIDER_SITE_OTHER): Payer: Medicare Other | Admitting: Podiatry

## 2017-09-18 DIAGNOSIS — B351 Tinea unguium: Secondary | ICD-10-CM

## 2017-09-18 DIAGNOSIS — M79676 Pain in unspecified toe(s): Secondary | ICD-10-CM

## 2017-09-18 NOTE — Progress Notes (Signed)
Patient ID: Michael Bryant, male   DOB: [birth year], 84 y.o.   MRN: [id number] Complaint:  Visit Type: Patient returns to my office for continued preventative foot care services. Complaint: Patient states" my nails have grown long and thick and become painful to walk and wear his shoes. . The patient presents for preventative foot care services. No changes to ROS  Podiatric Exam: Vascular: dorsalis pedis and posterior tibial pulses are palpable bilateral. Capillary return is immediate. Temperature gradient is WNL. Skin turgor WNL  Sensorium: Normal Semmes Weinstein monofilament test. Normal tactile sensation bilaterally. Nail Exam: Pt has thick disfigured discolored nails with subungual debris noted bilateral entire nail hallux through fifth toenails Ulcer Exam: There is no evidence of ulcer or pre-ulcerative changes or infection. Orthopedic Exam: Muscle tone and strength are WNL. No limitations in general ROM. No crepitus or effusions noted. Foot type and digits show no abnormalities. Bony prominences are unremarkable. Skin: No Porokeratosis. No infection or ulcers  Diagnosis:  Onychomycosis, , Pain in right toe, pain in left toes  Treatment & Plan Procedures and Treatment: Consent by patient was obtained for treatment procedures. The patient understood the discussion of treatment and procedures well. All questions were answered thoroughly reviewed. Debridement of mycotic and hypertrophic toenails, 1 through 5 bilateral and clearing of subungual debris. No ulceration, no infection noted. ABN signed for 2019 Return Visit-Office Procedure: Patient instructed to return to the office for a follow up visit 3 months for continued evaluation and treatment.  Gardiner Barefoot DPM

## 2017-09-30 ENCOUNTER — Telehealth: Payer: Self-pay | Admitting: Cardiovascular Disease

## 2017-09-30 MED ORDER — NITROGLYCERIN 0.4 MG SL SUBL: 0.4000 mg | SUBLINGUAL_TABLET | SUBLINGUAL | 2 refills | Status: DC | PRN

## 2017-09-30 NOTE — Telephone Encounter (Signed)
New Message    *STAT* If patient is at the pharmacy, call can be transferred to refill team.   1. Which medications need to be refilled? (please list name of each medication and dose if known) nitroGLYCERIN (NITROSTAT) 0.4 MG SL tablet  2. Which pharmacy/location (including street and city if local pharmacy) is medication to be sent to? [id number] - [place], [place] - [address]  3. Do they need a 30 day or 90 day supply?

## 2017-09-30 NOTE — Telephone Encounter (Signed)
Rx sent to pharmacy   

## 2017-10-01 ENCOUNTER — Other Ambulatory Visit: Payer: Self-pay | Admitting: Cardiovascular Disease

## 2017-10-02 NOTE — Telephone Encounter (Signed)
Rx sent to pharmacy   

## 2017-10-16 DIAGNOSIS — H9209 Otalgia, unspecified ear: Secondary | ICD-10-CM | POA: Diagnosis not present

## 2017-10-16 DIAGNOSIS — H66002 Acute suppurative otitis media without spontaneous rupture of ear drum, left ear: Secondary | ICD-10-CM | POA: Diagnosis not present

## 2017-10-23 DIAGNOSIS — H353133 Nonexudative age-related macular degeneration, bilateral, advanced atrophic without subfoveal involvement: Secondary | ICD-10-CM | POA: Diagnosis not present

## 2017-10-29 ENCOUNTER — Telehealth: Payer: Self-pay | Admitting: Cardiovascular Disease

## 2017-10-29 NOTE — Telephone Encounter (Signed)
Returned call to patient's wife.She stated husband has had 2 episodes of fast heart beat this month.No chest pain.No sob.Stated he feels good today.Stated he has been wanting to go out of town.She would like him seen before he goes.Appointment scheduled with Jory Sims DNP 11/06/17 at 11:30 am.Advised to go to ED if needed.

## 2017-10-29 NOTE — Telephone Encounter (Signed)
New Message        Patient's wife called and states her husband heart has been racing and he wants to go out of town. Patient took a nitroglycerin and it help. Pls advise. Patient wife is just concerned. Heart not racing at this time.

## 2017-11-04 NOTE — Progress Notes (Signed)
Cardiology Office Note   Date:  11/06/2017   ID:  Michael Bryant, DOB [birth year], MRN [id number]  PCP:  [name], MD  Cardiologist: Dr. Sallyanne Kuster   Chief Complaint  Patient presents with  . Follow-up    denies chest pains, mentions fast HR,      History of Present Illness: Michael Bryant is a 84 y.o. male who presents for ongoing assessment and management of CAD, with hx of cardiac arrest in 2003 which led to CABG, hx of NSVT in 2015 found on loop recorder. He was placed on amiodarone. His loop recorder revealed a few episodes of pseudo bradycardia related to episodes of ventricular bigeminy.   Repeat stress test in 2015 was negative for ischemia. When seen last it was noted that his loop recorder had reached RRT. It was not felt to need replacement. He has had no syncopal episodes since starting amiodarone.   Patient's wife called our office on 10/29/2017 stating that he had two episodes of rapid HR. No other symptoms, to include syncope, dyspnea, or chest pain. They requested appointment for evaluation.    He states that this occurred while he was sleeping. It awoke him and he got up walked around. Took one NTG and HR improved within " a few minutes." He states that this occurred a second time at night as well. He did not take a NTG. These episodes were about 10 days apart. He admits to being outside in the heat working in his garden hoeing and getting over heated on or around both of those episodes of elevated HR. He denied chest pain or associated dyspnea.   Past Medical History:  Diagnosis Date  . CAD (coronary artery disease)    a. s/p CABG (2003)  . Cardiac arrest ([place]) 2003  . Hyperlipidemia   . NSVT (nonsustained ventricular tachycardia) ([place]) 06/16/2013    Past Surgical History:  Procedure Laterality Date  . CARDIAC CATHETERIZATION  12/15/2001   60% proximal LAD narrowing with active mobile thrombosis w/50% sequential areas,50-60% pro  . CORONARY ARTERY BYPASS GRAFT  2004   LIMA to LAD, SVG to OM, SVG to RCA  . LEFT HEART CATHETERIZATION WITH CORONARY/GRAFT ANGIOGRAM N/A 07/02/2013   Procedure: LEFT HEART CATHETERIZATION WITH Beatrix Fetters;  Surgeon: Sanda Klein, MD;  Location: [facility];  Service: Cardiovascular;  Laterality: N/A;  . NM MYOCAR PERF WALL MOTION  12/09/2009   mod. perfusion defect basal inferior and mid inferior regions c/w  infarct/scar, EF 54%  . TONSILLECTOMY AND ADENOIDECTOMY       Current Outpatient Medications  Medication Sig Dispense Refill  . amiodarone (PACERONE) 200 MG tablet TAKE ONE-HALF TABLET BY MOUTH DAILY 45 tablet 5  . aspirin EC 81 MG tablet Take 81 mg by mouth daily.    . beta carotene w/minerals (OCUVITE) tablet Take 1 tablet by mouth daily.    Marland Kitchen CINNAMON PO Take 1,000 mg by mouth daily.     Marland Kitchen DICLOFENAC PO Take 100 mg by mouth daily.     . fish oil-omega-3 fatty acids 1000 MG capsule Take 1 g by mouth at bedtime.     . Glucosamine HCl (GLUCOSAMINE PO) Take 1 tablet by mouth 2 (two) times daily.    . Misc Natural Products (PROSTATE HEALTH PO) Take 1 tablet by mouth daily.    . nitroGLYCERIN (NITROSTAT) 0.4 MG SL tablet Place 1 tablet (0.4 mg total) under the tongue every 5 (five) minutes as needed for chest pain. 25 tablet 2  .  omeprazole (PRILOSEC) 10 MG capsule Take 10 mg by mouth daily.    [name] (SYSTANE OP) Place 1 drop into both eyes daily as needed (dry eyes).    . simvastatin (ZOCOR) 20 MG tablet TAKE 1 TABLET(20 MG) BY MOUTH AT BEDTIME 90 tablet 0  . tamsulosin (FLOMAX) 0.4 MG CAPS capsule Take 0.4 mg by mouth daily after supper.     . trandolapril (MAVIK) 2 MG tablet TAKE 1 TABLET BY MOUTH EVERY DAY 90 tablet 0   No current facility-administered medications for this visit.     Allergies:   Codeine    Social History:  The patient  reports that he has quit smoking. His smoking use included cigarettes. He has quit using smokeless tobacco. He reports that he does not drink  alcohol or use drugs.   Family History:  The patient's family history includes CAD (age of onset: 104) in his father; Cancer (age of onset: 26) in his mother.    ROS: All other systems are reviewed and negative. Unless otherwise mentioned in H&P    PHYSICAL EXAM: VS:  BP 140/70 (BP Location: Right Arm, Patient Position: Sitting)   Pulse (!) 56   Ht 5\' 10"  (1.778 m)   Wt 188 lb 12.8 oz (85.6 kg)   BMI 27.09 kg/m  , BMI Body mass index is 27.09 kg/m. GEN: Well nourished, well developed, in no acute distress  HEENT: normal  Neck: no JVD, carotid bruits, or masses Cardiac: RRR; bradycardic 1/6 systolic murmur, rubs, or gallops,no edema  Respiratory:  clear to auscultation bilaterally, normal work of breathing GI: soft, nontender, nondistended, + BS MS: no deformity or atrophy  Skin: warm and dry, no rash Neuro:  Strength and sensation are intact Psych: euthymic mood, full affect   EKG:  Sinus bradycardia, with 1st degree AV block.HR of 56 bpm. PR interval of 212 ms.   Recent Labs: No results found for requested labs within last 8760 hours.    Lipid Panel    Component Value Date/Time   CHOL 122 03/25/2013 0312   TRIG 104 03/25/2013 0312   HDL 52 03/25/2013 0312   CHOLHDL 2.3 03/25/2013 0312   VLDL 21 03/25/2013 0312   LDLCALC 49 03/25/2013 0312      Wt Readings from Last 3 Encounters:  11/06/17 188 lb 12.8 oz (85.6 kg)  11/28/16 190 lb (86.2 kg)  03/20/16 195 lb (88.5 kg)      Other studies Reviewed: Echocardiogram 08/17/2015 Left ventricle: Global LV longitudinal strain was -16.5% The   cavity size was normal. There was moderate concentric   hypertrophy. Systolic function was normal. The estimated ejection   fraction was in the range of 55% to 60%. Possible hypokinesis of   the apical myocardium. There was an increased relative   contribution of atrial contraction to ventricular filling.   Doppler parameters are consistent with abnormal left ventricular    relaxation (grade 1 diastolic dysfunction). - Aortic valve: Trileaflet; mildly thickened, mildly calcified   leaflets. - Pulmonic valve: There was trivial regurgitation.  NM Stress Test 03/25/2013.  IMPRESSION: 1. Minimal attenuation involving the inferior and to a lesser extent, the anterior walls of the left ventricle, without associated wall motion abnormalities. No definitive scintigraphic evidence of prior infarction or exercise induced ischemia. 2. Normal wall motion.  Ejection fraction - 60%  ASSESSMENT AND PLAN:  1.  CAD: Hx of CABG. Denies chest pain, DOE, or fatigue. Last stress test in 2015. No evidence of  ischemia;   2. Recurrent palpitations: Two episodes over a 10 day period, one of which awakened him. He has not had a recurrence over the last 7 days. He admitted to being outside in the hot sun for over an hour on or around the day prior to the nighttime episodes. He has a hx of NSVT and remains on amiodarone.   will check a BMET and Mg to evaluate his current status. He is also having a ZIO XT 14 day monitor placed. EKG revealed bradycardia 56 bpm at this time. His loop recorder no longer transmits.   3. Hypertension: BP is slightly elevated today. Will monitor. He states at home it runs in the 093'J systolic.    Current medicines are reviewed at length with the patient today.    Labs/ tests ordered today include:   Phill Myron. West Pugh, ANP, AACC   11/06/2017 11:41 AM    [facility] [address] [place], [place] [address] Phone: [phone]; Fax: [phone]

## 2017-11-06 ENCOUNTER — Ambulatory Visit (INDEPENDENT_AMBULATORY_CARE_PROVIDER_SITE_OTHER): Payer: Medicare Other | Admitting: Adult Health

## 2017-11-06 ENCOUNTER — Encounter: Payer: Self-pay | Admitting: Adult Health

## 2017-11-06 VITALS — BP 140/70 | HR 56 | Ht 70.0 in | Wt 188.8 lb

## 2017-11-06 DIAGNOSIS — I2510 Atherosclerotic heart disease of native coronary artery without angina pectoris: Secondary | ICD-10-CM

## 2017-11-06 DIAGNOSIS — R000 Tachycardia, unspecified: Secondary | ICD-10-CM | POA: Diagnosis not present

## 2017-11-06 DIAGNOSIS — I10 Essential (primary) hypertension: Secondary | ICD-10-CM

## 2017-11-06 DIAGNOSIS — I472 Ventricular tachycardia: Secondary | ICD-10-CM | POA: Diagnosis not present

## 2017-11-06 DIAGNOSIS — Z79899 Other long term (current) drug therapy: Secondary | ICD-10-CM

## 2017-11-06 DIAGNOSIS — I4729 Other ventricular tachycardia: Secondary | ICD-10-CM

## 2017-11-06 NOTE — Patient Instructions (Signed)
Medication Instructions:  NO CHANGES- Your physician recommends that you continue on your current medications as directed. Please refer to the Current Medication list given to you today.  If you need a refill on your cardiac medications before your next appointment, please call your pharmacy.  Labwork: BMET AND MAG TODAY HERE IN OUR OFFICE AT [facility]  Take the provided lab slips with you to the lab for your blood draw.   Testing/Procedures: Your physician has recommended that you wear an 14 DAY ZIO-PATCH monitor. The Zio patch cardiac monitor continuously records heart rhythm data for up to 14 days, this is for patients being evaluated for multiple types heart rhythms. For the first 24 hours post application, please avoid getting the Zio monitor wet in the shower or by excessive sweating during exercise. After that, feel free to carry on with regular activities. Keep soaps and lotions away from the ZIO XT Patch.  This will be placed at our [facility] location - [address], [address].    Follow-Up: Your physician wants you to follow-up in: [place] ([place]), DNP,AACC IF PRIMARY CARDIOLOGIST IS UNAVAILABLE.    Thank you for choosing [facility] at [facility]!!

## 2017-11-06 NOTE — Progress Notes (Signed)
Thank you MCr 

## 2017-11-07 LAB — BASIC METABOLIC PANEL
BUN/Creatinine Ratio: 20 (ref 10–24)
BUN: 18 mg/dL (ref 8–27)
CHLORIDE: 102 mmol/L (ref 96–106)
CO2: 25 mmol/L (ref 20–29)
Calcium: 9.2 mg/dL (ref 8.6–10.2)
Creatinine, Ser: 0.92 mg/dL (ref 0.76–1.27)
GFR calc non Af Amer: 77 mL/min/{1.73_m2} (ref >59)
GFR, EST AFRICAN AMERICAN: 89 mL/min/{1.73_m2} (ref >59)
Glucose: 86 mg/dL (ref 65–99)
Potassium: 4.7 mmol/L (ref 3.5–5.2)
Sodium: 141 mmol/L (ref 134–144)

## 2017-11-07 LAB — MAGNESIUM: MAGNESIUM: 2.2 mg/dL (ref 1.6–2.3)

## 2017-11-18 ENCOUNTER — Ambulatory Visit (INDEPENDENT_AMBULATORY_CARE_PROVIDER_SITE_OTHER): Payer: Medicare Other

## 2017-11-18 DIAGNOSIS — R000 Tachycardia, unspecified: Secondary | ICD-10-CM

## 2017-11-22 DIAGNOSIS — Z85828 Personal history of other malignant neoplasm of skin: Secondary | ICD-10-CM | POA: Diagnosis not present

## 2017-11-22 DIAGNOSIS — L720 Epidermal cyst: Secondary | ICD-10-CM | POA: Diagnosis not present

## 2017-11-25 DIAGNOSIS — E559 Vitamin D deficiency, unspecified: Secondary | ICD-10-CM | POA: Diagnosis not present

## 2017-11-25 DIAGNOSIS — E7800 Pure hypercholesterolemia, unspecified: Secondary | ICD-10-CM | POA: Diagnosis not present

## 2017-11-25 DIAGNOSIS — Z125 Encounter for screening for malignant neoplasm of prostate: Secondary | ICD-10-CM | POA: Diagnosis not present

## 2017-11-25 DIAGNOSIS — E119 Type 2 diabetes mellitus without complications: Secondary | ICD-10-CM | POA: Diagnosis not present

## 2017-11-25 DIAGNOSIS — I10 Essential (primary) hypertension: Secondary | ICD-10-CM | POA: Diagnosis not present

## 2017-12-02 DIAGNOSIS — Z0000 Encounter for general adult medical examination without abnormal findings: Secondary | ICD-10-CM | POA: Diagnosis not present

## 2017-12-02 DIAGNOSIS — Z125 Encounter for screening for malignant neoplasm of prostate: Secondary | ICD-10-CM | POA: Diagnosis not present

## 2017-12-02 DIAGNOSIS — I10 Essential (primary) hypertension: Secondary | ICD-10-CM | POA: Diagnosis not present

## 2017-12-02 DIAGNOSIS — E7800 Pure hypercholesterolemia, unspecified: Secondary | ICD-10-CM | POA: Diagnosis not present

## 2017-12-02 DIAGNOSIS — E119 Type 2 diabetes mellitus without complications: Secondary | ICD-10-CM | POA: Diagnosis not present

## 2017-12-02 DIAGNOSIS — E559 Vitamin D deficiency, unspecified: Secondary | ICD-10-CM | POA: Diagnosis not present

## 2017-12-05 DIAGNOSIS — Z85828 Personal history of other malignant neoplasm of skin: Secondary | ICD-10-CM | POA: Diagnosis not present

## 2017-12-05 DIAGNOSIS — L720 Epidermal cyst: Secondary | ICD-10-CM | POA: Diagnosis not present

## 2017-12-05 DIAGNOSIS — L708 Other acne: Secondary | ICD-10-CM | POA: Diagnosis not present

## 2017-12-07 NOTE — Progress Notes (Deleted)
Cardiology Office Note   Date:  12/07/2017   ID:  Michael Bryant, DOB 02-Jul-1933, MRN [id number]  PCP:  [name], MD  Cardiologist: Dr. Sallyanne Kuster No chief complaint on file. (Needs cardiac monitor report)   History of Present Illness: Michael Bryant is a 84 y.o. male who presents for ongoing assessment and management of coronary artery disease, history of CABG in 2003, and SVT in 2015 found on a loop recorder.  The patient is on amiodarone for rate control.  Follow-up stress test in 2015 was negative for ischemia.  At that time the loop recorder was found to have reached RRT and was felt not to be needed replacement.  On last office visit, the patient had requested the appointment as his wife said that he had 2 episodes of rapid heart rate with no associated symptoms.  He stated that it occurred while he was sleeping and woke him up.  The episodes were approximately 10 days apart.  He admitted to being outside working in the heat in his garden both days and getting overheated.  Follow-up BMET was ordered along with magnesium to evaluate current status.  A ZIO XT 14-day monitor was placed to evaluate for recurrent arrhythmias    Past Medical History:  Diagnosis Date  . CAD (coronary artery disease)    a. s/p CABG (2003)  . Cardiac arrest ([place]) 2003  . Hyperlipidemia   . NSVT (nonsustained ventricular tachycardia) ([place]) 06/16/2013    Past Surgical History:  Procedure Laterality Date  . CARDIAC CATHETERIZATION  12/15/2001   60% proximal LAD narrowing with active mobile thrombosis w/50% sequential areas,50-60% pro  . CORONARY ARTERY BYPASS GRAFT  2004   LIMA to LAD, SVG to OM, SVG to RCA  . LEFT HEART CATHETERIZATION WITH CORONARY/GRAFT ANGIOGRAM N/A 07/02/2013   Procedure: LEFT HEART CATHETERIZATION WITH Beatrix Fetters;  Surgeon: Sanda Klein, MD;  Location: [facility];  Service: Cardiovascular;  Laterality: N/A;  . NM MYOCAR PERF WALL MOTION  12/09/2009   mod. perfusion defect  basal inferior and mid inferior regions c/w  infarct/scar, EF 54%  . TONSILLECTOMY AND ADENOIDECTOMY       Current Outpatient Medications  Medication Sig Dispense Refill  . amiodarone (PACERONE) 200 MG tablet TAKE ONE-HALF TABLET BY MOUTH DAILY 45 tablet 5  . aspirin EC 81 MG tablet Take 81 mg by mouth daily.    . beta carotene w/minerals (OCUVITE) tablet Take 1 tablet by mouth daily.    Marland Kitchen CINNAMON PO Take 1,000 mg by mouth daily.     Marland Kitchen DICLOFENAC PO Take 100 mg by mouth daily.     . fish oil-omega-3 fatty acids 1000 MG capsule Take 1 g by mouth at bedtime.     . Glucosamine HCl (GLUCOSAMINE PO) Take 1 tablet by mouth 2 (two) times daily.    . Misc Natural Products (PROSTATE HEALTH PO) Take 1 tablet by mouth daily.    . nitroGLYCERIN (NITROSTAT) 0.4 MG SL tablet Place 1 tablet (0.4 mg total) under the tongue every 5 (five) minutes as needed for chest pain. 25 tablet 2  . omeprazole (PRILOSEC) 10 MG capsule Take 10 mg by mouth daily.    [name] (SYSTANE OP) Place 1 drop into both eyes daily as needed (dry eyes).    . simvastatin (ZOCOR) 20 MG tablet TAKE 1 TABLET(20 MG) BY MOUTH AT BEDTIME 90 tablet 0  . tamsulosin (FLOMAX) 0.4 MG CAPS capsule Take 0.4 mg by mouth daily after  supper.     . trandolapril (MAVIK) 2 MG tablet TAKE 1 TABLET BY MOUTH EVERY DAY 90 tablet 0   No current facility-administered medications for this visit.     Allergies:   Codeine    Social History:  The patient  reports that he has quit smoking. His smoking use included cigarettes. He has quit using smokeless tobacco. He reports that he does not drink alcohol or use drugs.   Family History:  The patient's family history includes CAD (age of onset: 58) in his father; Cancer (age of onset: 66) in his mother.    ROS: All other systems are reviewed and negative. Unless otherwise mentioned in H&P    PHYSICAL EXAM: VS:  There were no vitals taken for this visit. , BMI There is no height or  weight on file to calculate BMI. GEN: Well nourished, well developed, in no acute distress HEENT: normal Neck: no JVD, carotid bruits, or masses Cardiac: ***RRR; no murmurs, rubs, or gallops,no edema  Respiratory:  Clear to auscultation bilaterally, normal work of breathing GI: soft, nontender, nondistended, + BS MS: no deformity or atrophy Skin: warm and dry, no rash Neuro:  Strength and sensation are intact Psych: euthymic mood, full affect   EKG:  EKG {ACTION; IS/IS PTW:[id number]} ordered today. The ekg ordered today demonstrates ***   Recent Labs: 11/06/2017: BUN 18; Creatinine, Ser 0.92; Magnesium 2.2; Potassium 4.7; Sodium 141    Lipid Panel    Component Value Date/Time   CHOL 122 03/25/2013 0312   TRIG 104 03/25/2013 0312   HDL 52 03/25/2013 0312   CHOLHDL 2.3 03/25/2013 0312   VLDL 21 03/25/2013 0312   LDLCALC 49 03/25/2013 0312      Wt Readings from Last 3 Encounters:  11/06/17 188 lb 12.8 oz (85.6 kg)  11/28/16 190 lb (86.2 kg)  03/20/16 195 lb (88.5 kg)      Other studies Reviewed: Additional studies/ records that were reviewed today include: ***. Review of the above records demonstrates: ***   ASSESSMENT AND PLAN:  1.  ***   Current medicines are reviewed at length with the patient today.    Labs/ tests ordered today include: *** Phill Myron. West Pugh, ANP, AACC   12/07/2017 10:05 AM    [facility] [place] [address] Office [phone] Fax [phone]

## 2017-12-09 ENCOUNTER — Ambulatory Visit: Payer: Medicare Other | Admitting: Adult Health

## 2017-12-17 ENCOUNTER — Ambulatory Visit (INDEPENDENT_AMBULATORY_CARE_PROVIDER_SITE_OTHER): Payer: Medicare Other | Admitting: Podiatry

## 2017-12-17 ENCOUNTER — Encounter: Payer: Self-pay | Admitting: Podiatry

## 2017-12-17 ENCOUNTER — Telehealth: Payer: Self-pay

## 2017-12-17 DIAGNOSIS — M79676 Pain in unspecified toe(s): Secondary | ICD-10-CM

## 2017-12-17 DIAGNOSIS — B351 Tinea unguium: Secondary | ICD-10-CM | POA: Diagnosis not present

## 2017-12-17 NOTE — Telephone Encounter (Signed)
-----   Message from Lendon Colonel, NP sent at 12/17/2017  1:04 PM EDT ----- Regarding: RE: ZIO MONITOR Patient had a min HR of 44 bpm, max HR of 107 bpm, and avg HR of 66 bpm. Predominant underlying rhythm was Sinus Rhythm. Isolated SVEs were rare (<1.0%), SVE Couplets were rare (<1.0%), and no SVE Triplets were present. Isolated VEs were rare (<1.0%), and no VE Couplets or VE  This is a good report. No significant sustained abnormal rhythms, some occasional ectopy. Continue his current regimen.   ----- Message ----- From: Waylan Rocher, LPN Sent: 04/14/3433   9:31 AM EDT To: Lendon Colonel, NP Subject: ZIO MONITOR                                    ZIO MONITOR IS IN THE CHART FOR YOU TO LOOK AT

## 2017-12-17 NOTE — Telephone Encounter (Signed)
PT NOTIFIED-HE WILL COME IN FOR SCHEDULED APPT 12-19-17 TO DISCUSS FURTHER

## 2017-12-17 NOTE — Progress Notes (Signed)
Cardiology Office Note   Date:  12/19/2017   ID:  Michael Bryant, DOB 06/04/1933, MRN [id number]  PCP:  [name], MD  Cardiologist:  Dr.Croituru  Chief Complaint  Patient presents with  . Coronary Artery Disease  . Tachycardia     History of Present Illness: Michael Bryant is a 84 y.o. male who presents for ongoing assessment and management of CAD, his of CABG after having cardiac arrest in 2003, hx of NSVT, repeat stress test in 2015 was negative for ischemia. He was last seen in the office on 11/06/2017 for complaints of rapid HR, awakening him from sleep.   As a result of these symptoms, he had labs to include BMET and Mg, Zio XT 14 day monitor was placed. Labs demonstrated creatinine of 0.92 K+ 4.7.   Zio Monitor Results.  Patient had a min HR of 44 bpm, max HR of 107 bpm, and avg HR of 66 bpm. Predominant underlying rhythm was Sinus Rhythm. Isolated SVEs were rare (<1.0%), SVE Couplets were rare (<1.0%), and no SVE Triplets were present. Isolated VEs were rare (<1.0%), and no VE Couplets or VE Triplets were present  Labs reviewed were WNL. Potassium 4.7. Mg 2.2.  He states that he continues to feel racing HR. On review of the monitor report where he activated the alarm reporting fluttering and elevated HR, the monitor read HR of 60 bpm, NSR ; a second patient triggered alarm monitor read HR of  NSR 56 bpm.  He wore the monitor for 3 days only, as he perspires a lot and the monitor no longer was able to stick to his skin.   Past Medical History:  Diagnosis Date  . CAD (coronary artery disease)    a. s/p CABG (2003)  . Cardiac arrest ([place]) 2003  . Hyperlipidemia   . NSVT (nonsustained ventricular tachycardia) ([place]) 06/16/2013    Past Surgical History:  Procedure Laterality Date  . CARDIAC CATHETERIZATION  12/15/2001   60% proximal LAD narrowing with active mobile thrombosis w/50% sequential areas,50-60% pro  . CORONARY ARTERY BYPASS GRAFT  2004   LIMA to LAD, SVG to OM, SVG  to RCA  . LEFT HEART CATHETERIZATION WITH CORONARY/GRAFT ANGIOGRAM N/A 07/02/2013   Procedure: LEFT HEART CATHETERIZATION WITH Beatrix Fetters;  Surgeon: Sanda Klein, MD;  Location: [facility];  Service: Cardiovascular;  Laterality: N/A;  . NM MYOCAR PERF WALL MOTION  12/09/2009   mod. perfusion defect basal inferior and mid inferior regions c/w  infarct/scar, EF 54%  . TONSILLECTOMY AND ADENOIDECTOMY       Current Outpatient Medications  Medication Sig Dispense Refill  . amiodarone (PACERONE) 200 MG tablet TAKE ONE-HALF TABLET BY MOUTH DAILY 45 tablet 5  . aspirin EC 81 MG tablet Take 81 mg by mouth daily.    . beta carotene w/minerals (OCUVITE) tablet Take 1 tablet by mouth daily.    . Chlorpheniramine Maleate (EQ CHLORTABS PO) Take 1 tablet by mouth as needed.    Marland Kitchen CINNAMON PO Take 1,000 mg by mouth daily.     Marland Kitchen DICLOFENAC PO Take 100 mg by mouth daily.     . fish oil-omega-3 fatty acids 1000 MG capsule Take 1 g by mouth daily.     . Glucosamine HCl (GLUCOSAMINE PO) Take 1 tablet by mouth 2 (two) times daily.    . Misc Natural Products (PROSTATE HEALTH PO) Take 1 tablet by mouth daily.    . nitroGLYCERIN (NITROSTAT) 0.4 MG SL tablet Place 1 tablet (  0.4 mg total) under the tongue every 5 (five) minutes as needed for chest pain. 25 tablet 2  . omeprazole (PRILOSEC) 10 MG capsule Take 10 mg by mouth daily.    [name] (SYSTANE OP) Place 1 drop into both eyes daily as needed (dry eyes).    . simvastatin (ZOCOR) 20 MG tablet TAKE 1 TABLET(20 MG) BY MOUTH AT BEDTIME 90 tablet 0  . tamsulosin (FLOMAX) 0.4 MG CAPS capsule Take 0.4 mg by mouth daily after supper.     . trandolapril (MAVIK) 2 MG tablet TAKE 1 TABLET BY MOUTH EVERY DAY 90 tablet 0   No current facility-administered medications for this visit.     Allergies:   Codeine    Social History:  The patient  reports that he has quit smoking. His smoking use included cigarettes. He has quit using  smokeless tobacco. He reports that he does not drink alcohol or use drugs.   Family History:  The patient's family history includes CAD (age of onset: 76) in his father; Cancer (age of onset: 32) in his mother.    ROS: All other systems are reviewed and negative. Unless otherwise mentioned in H&P    PHYSICAL EXAM: VS:  BP 130/70 (BP Location: Right Arm, Patient Position: Sitting, Cuff Size: Normal)   Pulse 62   Ht 5\' 10"  (1.778 m)   Wt 188 lb 12.8 oz (85.6 kg)   SpO2 96%   BMI 27.09 kg/m  , BMI Body mass index is 27.09 kg/m. GEN: Well nourished, well developed, in no acute distress HEENT: normal Neck: no JVD, carotid bruits, or masses Cardiac: RRR; no murmurs, rubs, or gallops,no edema  Respiratory:  Clear to auscultation bilaterally, normal work of breathing GI: soft, nontender, nondistended, + BS MS: no deformity or atrophy Skin: warm and dry, no rash Neuro:  Strength and sensation are intact Psych: euthymic mood, full affect   EKG: Not competed this office visit,  Recent Labs: 11/06/2017: BUN 18; Creatinine, Ser 0.92; Magnesium 2.2; Potassium 4.7; Sodium 141    Lipid Panel    Component Value Date/Time   CHOL 122 03/25/2013 0312   TRIG 104 03/25/2013 0312   HDL 52 03/25/2013 0312   CHOLHDL 2.3 03/25/2013 0312   VLDL 21 03/25/2013 0312   LDLCALC 49 03/25/2013 0312      Wt Readings from Last 3 Encounters:  12/19/17 188 lb 12.8 oz (85.6 kg)  11/06/17 188 lb 12.8 oz (85.6 kg)  11/28/16 190 lb (86.2 kg)      Other studies Reviewed: Echo 08/11/2015 Left ventricle: Global LV longitudinal strain was -16.5% The   cavity size was normal. There was moderate concentric   hypertrophy. Systolic function was normal. The estimated ejection   fraction was in the range of 55% to 60%. Possible hypokinesis of   the apical myocardium. There was an increased relative   contribution of atrial contraction to ventricular filling.   Doppler parameters are consistent with abnormal  left ventricular   relaxation (grade 1 diastolic dysfunction). - Aortic valve: Trileaflet; mildly thickened, mildly calcified   leaflets. - Pulmonic valve: There was trivial regurgitation.  ASSESSMENT AND PLAN:  1. Palpitations: Zio Monitor did not reveal any arrhythmia's. He has had a loop recorder in the past and may need to be considered for additional one. His current recorder "is dead" per the patient. He will follow up with Dr. Sallyanne Kuster and discuss this further based upon recommendations of need to replace or not. His palpitations  are not limiting his activities. Will continue current regimen.   2. CAD: No complaints of chest pain. Most recent stress test in 2015 was negative for ischemia. He walks 3-4 miles a day and has no symptoms of fatigue or dyspnea.   3. Hyperlipidemia: Continue simvastatin. Goal of LDL < 70.  Current medicines are reviewed at length with the patient today.    Labs/ tests ordered today include:None  Phill Myron. West Pugh, ANP, AACC   12/19/2017 8:23 AM    [place] [address] Office [phone] Fax [phone]

## 2017-12-18 ENCOUNTER — Ambulatory Visit: Payer: Medicare Other | Admitting: Podiatry

## 2017-12-19 ENCOUNTER — Ambulatory Visit (INDEPENDENT_AMBULATORY_CARE_PROVIDER_SITE_OTHER): Payer: Medicare Other | Admitting: Adult Health

## 2017-12-19 ENCOUNTER — Encounter: Payer: Self-pay | Admitting: Adult Health

## 2017-12-19 VITALS — BP 130/70 | HR 62 | Ht 70.0 in | Wt 188.8 lb

## 2017-12-19 DIAGNOSIS — E7800 Pure hypercholesterolemia, unspecified: Secondary | ICD-10-CM | POA: Diagnosis not present

## 2017-12-19 DIAGNOSIS — R000 Tachycardia, unspecified: Secondary | ICD-10-CM

## 2017-12-19 DIAGNOSIS — I2510 Atherosclerotic heart disease of native coronary artery without angina pectoris: Secondary | ICD-10-CM | POA: Diagnosis not present

## 2017-12-19 NOTE — Patient Instructions (Signed)
Follow-Up: You will need a follow up appointment in 6 WEEKS.  You may see  DR Sallyanne Kuster ONLY!  At [facility], you and your health needs are our priority.  As part of our continuing mission to provide you with exceptional heart care, we have created designated Provider Care Teams.  These Care Teams include your primary Cardiologist (physician) and Advanced Practice Providers (APPs -  Physician Assistants and Nurse Practitioners) who all work together to provide you with the care you need, when you need it.  Medication Instructions:  NO CHANGES- Your physician recommends that you continue on your current medications as directed. Please refer to the Current Medication list given to you today.  If you need a refill on your cardiac medications before your next appointment, please call your pharmacy.  Labwork: If you have labs (blood work) drawn today and your tests are completely normal, you will receive your results only by: Marland Kitchen MyChart Message (if you have MyChart) OR . A paper copy in the mail If you have any lab test that is abnormal or we need to change your treatment, we will call you to review the results.  Thank you for choosing [facility] at [facility]!!

## 2017-12-20 ENCOUNTER — Other Ambulatory Visit: Payer: Self-pay | Admitting: Cardiovascular Disease

## 2017-12-20 NOTE — Progress Notes (Signed)
Very normal event monitor, including when he had symptoms. I do not think we need to replace his ILR. MCr

## 2017-12-22 NOTE — Progress Notes (Signed)
Subjective:   Patient ID: Michael Bryant, male   DOB: 84 y.o.   MRN: [id number]   HPI Patient presents with nail disease 1-5 both feet that are thick yellow and they become painful with shoe gear and he is not able to reach them or take care of them   ROS      Objective:  Physical Exam  Neurovascular status is unchanged with thick yellow brittle nailbeds 1-5 both feet that are incurvated with quarter and tender     Assessment:  Chronic mycotic nail infection 1-5 both feet with pain     Plan:  Debride painful nailbeds 1-5 both feet with no iatrogenic bleeding noted

## 2017-12-22 NOTE — Progress Notes (Signed)
Thank you. He has follow up with you next visit.

## 2018-01-23 ENCOUNTER — Encounter: Payer: Self-pay | Admitting: *Deleted

## 2018-02-04 ENCOUNTER — Encounter: Payer: Self-pay | Admitting: Cardiovascular Disease

## 2018-02-04 ENCOUNTER — Ambulatory Visit (INDEPENDENT_AMBULATORY_CARE_PROVIDER_SITE_OTHER): Payer: Medicare Other | Admitting: Cardiovascular Disease

## 2018-02-04 VITALS — BP 130/80 | HR 61 | Ht 70.0 in | Wt 190.0 lb

## 2018-02-04 DIAGNOSIS — I472 Ventricular tachycardia: Secondary | ICD-10-CM | POA: Diagnosis not present

## 2018-02-04 DIAGNOSIS — I2510 Atherosclerotic heart disease of native coronary artery without angina pectoris: Secondary | ICD-10-CM

## 2018-02-04 DIAGNOSIS — E7800 Pure hypercholesterolemia, unspecified: Secondary | ICD-10-CM | POA: Diagnosis not present

## 2018-02-04 DIAGNOSIS — I10 Essential (primary) hypertension: Secondary | ICD-10-CM

## 2018-02-04 DIAGNOSIS — Z5181 Encounter for therapeutic drug level monitoring: Secondary | ICD-10-CM | POA: Diagnosis not present

## 2018-02-04 DIAGNOSIS — Z79899 Other long term (current) drug therapy: Secondary | ICD-10-CM

## 2018-02-04 DIAGNOSIS — I4729 Other ventricular tachycardia: Secondary | ICD-10-CM

## 2018-02-04 NOTE — Patient Instructions (Signed)
Medication Instructions:  Dr Sallyanne Kuster recommends that you continue on your current medications as directed. Please refer to the Current Medication list given to you today.  If you need a refill on your cardiac medications before your next appointment, please call your pharmacy.   Lab work: Your physician recommends that you return for lab work at your convenience. We've requested labs form Dr Julianne Rice office and will call you if you need to have these completed.   If you have labs (blood work) drawn today and your tests are completely normal, you will receive your results only by: Marland Kitchen MyChart Message (if you have MyChart) OR . A paper copy in the mail If you have any lab test that is abnormal or we need to change your treatment, we will call you to review the results.  Follow-Up: At [facility], you and your health needs are our priority.  As part of our continuing mission to provide you with exceptional heart care, we have created designated Provider Care Teams.  These Care Teams include your primary Cardiologist (physician) and Advanced Practice Providers (APPs -  Physician Assistants and Nurse Practitioners) who all work together to provide you with the care you need, when you need it. You will need a follow up appointment in 12 months.  Please call our office 2 months in advance to schedule this appointment.  You may see Sanda Klein, MD or one of the following Advanced Practice Providers on your designated Care Team: [place], [place] . Fabian Sharp, PA-C

## 2018-02-04 NOTE — Progress Notes (Signed)
Cardiology Office Note    Date:  02/04/2018   ID:  [name], DOB 1934/02/16, MRN [id number]  PCP:  [name], MD  Cardiologist:   Sanda Klein, MD   Chief Complaint  Patient presents with  . Follow-up    6 weeks.    History of Present Illness:  Michael Bryant is a 84 y.o. male with a remote history of coronary artery disease presenting as cardiac arrest in 2003 and leading to bypass surgery, near-syncope related to nonsustained ventricular tachycardia, no recurrence on Amiodarone, normal left ventricular systolic function by echo (last checked 30-Sep-2015).  Has an ILR, but the device reached RRT on 11/20/2016. Event monitor for palpitations in September 2019 showed rare PACs and rare PVCs, normal rhythm during subjective "racing heartbeat".  He remains very active for his age.  Over the summer was very busy tending to a large garden and involving a sleeping along with a push more.  He is now walking on his treadmill 6 days a week for about 30 minutes.  He feels well. The patient specifically denies any chest pain at rest exertion, dyspnea at rest or with exertion, orthopnea, paroxysmal nocturnal dyspnea, syncope, palpitations, focal neurological deficits, intermittent claudication, lower extremity edema, unexplained weight gain, cough, hemoptysis or wheezing.  He is the youngest of 13 siblings and almost all of the others have passed away.  His older brother [name] died in 09-30-2022.  His only remaining sister is in a nursing home and has advanced Alzheimer's disease.  He was initially seen for emergency CABG 12/16/2001 with a witnessed VF arrest , significant LAD disease with intraluminal thrombus. He underwent LIMA-LAD, VG-2 OM 1, VG-distal RCA.  In 03/2013 he underwent a stress Myoview which was negative for ischemia; EF 60%. In 06/2013 he had syncope following sexual intercourse. When 911 emergency services arrived was found to have extremely frequent PVCs and NSVT on the monitor.    He underwent cardiac catheterization on 07/02/13 which revealed: "Severe native coronary artery disease, graft dependent. Widely patent bypass grafts. Normal left ventricular systolic function. Beta blockers have been avoided due to bradycardia, but he tolerates low-dose amiodarone.  Past Medical History:  Diagnosis Date  . CAD (coronary artery disease)    a. s/p CABG (2003)  . Cardiac arrest ([place]) 2003  . Hyperlipidemia   . NSVT (nonsustained ventricular tachycardia) ([place]) 06/16/2013    Past Surgical History:  Procedure Laterality Date  . CARDIAC CATHETERIZATION  12/15/2001   60% proximal LAD narrowing with active mobile thrombosis w/50% sequential areas,50-60% pro  . CORONARY ARTERY BYPASS GRAFT  2004   LIMA to LAD, SVG to OM, SVG to RCA  . LEFT HEART CATHETERIZATION WITH CORONARY/GRAFT ANGIOGRAM N/A 07/02/2013   Procedure: LEFT HEART CATHETERIZATION WITH Beatrix Fetters;  Surgeon: Sanda Klein, MD;  Location: [place] CATH [facility];  Service: Cardiovascular;  Laterality: N/A;  . NM MYOCAR PERF WALL MOTION  12/09/2009   mod. perfusion defect basal inferior and mid inferior regions c/w  infarct/scar, EF 54%  . TONSILLECTOMY AND ADENOIDECTOMY      Current Medications: Outpatient Medications Prior to Visit  Medication Sig Dispense Refill  . amiodarone (PACERONE) 200 MG tablet TAKE ONE-HALF TABLET BY MOUTH DAILY 45 tablet 5  . aspirin EC 81 MG tablet Take 81 mg by mouth daily.    . beta carotene w/minerals (OCUVITE) tablet Take 1 tablet by mouth daily.    . Chlorpheniramine Maleate (EQ CHLORTABS PO) Take 1 tablet by mouth as needed.    Marland Kitchen  CINNAMON PO Take 1,000 mg by mouth daily.     Marland Kitchen DICLOFENAC PO Take 100 mg by mouth daily.     . fish oil-omega-3 fatty acids 1000 MG capsule Take 1 g by mouth daily.     . Glucosamine HCl (GLUCOSAMINE PO) Take 1 tablet by mouth 2 (two) times daily.    . Misc Natural Products (PROSTATE HEALTH PO) Take 1 tablet by mouth daily.    . nitroGLYCERIN  (NITROSTAT) 0.4 MG SL tablet Place 1 tablet (0.4 mg total) under the tongue every 5 (five) minutes as needed for chest pain. 25 tablet 2  . omeprazole (PRILOSEC) 10 MG capsule Take 10 mg by mouth daily.    [name] (SYSTANE OP) Place 1 drop into both eyes daily as needed (dry eyes).    . simvastatin (ZOCOR) 20 MG tablet TAKE 1 TABLET(20 MG) BY MOUTH AT BEDTIME 90 tablet 0  . tamsulosin (FLOMAX) 0.4 MG CAPS capsule Take 0.4 mg by mouth daily after supper.     . trandolapril (MAVIK) 2 MG tablet TAKE 1 TABLET BY MOUTH EVERY DAY 90 tablet 0   No facility-administered medications prior to visit.      Allergies:   Codeine   Social History   Socioeconomic History  . Marital status: Married    Spouse name: Not on file  . Number of children: 2  . Years of education: Not on file  . Highest education level: Not on file  Occupational History  . Occupation: Retired  Scientific laboratory technician  . Financial resource strain: Not on file  . Food insecurity:    Worry: Not on file    Inability: Not on file  . Transportation needs:    Medical: Not on file    Non-medical: Not on file  Tobacco Use  . Smoking status: Former Smoker    Types: Cigarettes  . Smokeless tobacco: Former Systems developer  . Tobacco comment: Quit 1993  Substance and Sexual Activity  . Alcohol use: No  . Drug use: No  . Sexual activity: Not on file  Lifestyle  . Physical activity:    Days per week: Not on file    Minutes per session: Not on file  . Stress: Not on file  Relationships  . Social connections:    Talks on phone: Not on file    Gets together: Not on file    Attends religious service: Not on file    Active member of club or organization: Not on file    Attends meetings of clubs or organizations: Not on file    Relationship status: Not on file  Other Topics Concern  . Not on file  Social History Narrative   Lives at home with wife.            Family History:  The patient's family history includes  CAD (age of onset: 26) in his father; Cancer (age of onset: 54) in his mother; Heart disease in his father.   ROS:   Please see the history of present illness.    ROS all other systems are reviewed and are negative  PHYSICAL EXAM:   VS:  BP 130/80 (BP Location: Left Arm, Patient Position: Sitting, Cuff Size: Normal)   Pulse 61   Ht 5\' 10"  (1.778 m)   Wt 190 lb (86.2 kg)   BMI 27.26 kg/m     General: Alert, oriented x3, no distress, minimally overweight.  Appears fit for his age. Head: no evidence of trauma,  PERRL, EOMI, no exophtalmos or lid lag, no myxedema, no xanthelasma; normal ears, nose and oropharynx Neck: normal jugular venous pulsations and no hepatojugular reflux; brisk carotid pulses without delay and no carotid bruits Chest: clear to auscultation, no signs of consolidation by percussion or palpation, normal fremitus, symmetrical and full respiratory excursions Cardiovascular: normal position and quality of the apical impulse, regular rhythm, normal first and second heart sounds, no murmurs, rubs or gallops Abdomen: no tenderness or distention, no masses by palpation, no abnormal pulsatility or arterial bruits, normal bowel sounds, no hepatosplenomegaly Extremities: no clubbing, cyanosis or edema; 2+ radial, ulnar and brachial pulses bilaterally; 2+ right femoral, posterior tibial and dorsalis pedis pulses; 2+ left femoral, posterior tibial and dorsalis pedis pulses; no subclavian or femoral bruits Neurological: grossly nonfocal Psych: Normal mood and affect   Wt Readings from Last 3 Encounters:  02/04/18 190 lb (86.2 kg)  12/19/17 188 lb 12.8 oz (85.6 kg)  11/06/17 188 lb 12.8 oz (85.6 kg)      Studies/Labs Reviewed:   EKG:  EKG is not ordered today.   [facility] 11/21/2016 Normal liver function tests, creatinine 1.0, TSH 1.99 05/23/2016 total cholesterol 139, HDL 48, LDL 60 May 31, 2017 normal liver function tests, creatinine 0.9, hemoglobin  15.0, hemoglobin A1c 6.1 Total cholesterol 143, triglycerides 91, HDL 54, LDL 71  He believes his liver tests were repeated a month or 2 ago, but I do not have the results yet.  ASSESSMENT:    1. Coronary artery disease involving native coronary artery of native heart without angina pectoris   2. NSVT (nonsustained ventricular tachycardia) ([place])   3. Hypercholesterolemia   4. Encounter for monitoring amiodarone therapy   5. On amiodarone therapy   6. Essential hypertension      PLAN:  In order of problems listed above:  1. CAD S/P CABG. Graft dependent.  No angina despite being very active.  Her left ventricular systolic function. 2. NSVT: Associated with syncope several years ago.  Doing 3 years of loop recorder monitoring, there was no recurrence of ventricular tachycardia while on amiodarone 100 mg daily.  The loop recorder is no longer active.  He wore an event monitor in September that did not show any complex ventricular arrhythmia. 3. HLP: LDL cholesterol in target range. We have previously discussed the interaction between amiodarone and simvastatin. The dose of simvastatin is low. He reports being intolerant of numerous other statins in the past and prefers not to switch to an alternative agent.  4. Amiodarone: Liver function tests and thyroid function test should be checked every 6 months.  We will request the most recent labs from [place]. 5. HTN: Well-controlled.    Medication Adjustments/Labs and Tests Ordered: Current medicines are reviewed at length with the patient today.  Concerns regarding medicines are outlined above.  Medication changes, Labs and Tests ordered today are listed in the Patient Instructions below. Patient Instructions  Medication Instructions:  Dr Sallyanne Kuster recommends that you continue on your current medications as directed. Please refer to the Current Medication list given to you today.  If you need a refill on your cardiac medications  before your next appointment, please call your pharmacy.   Lab work: Your physician recommends that you return for lab work at your convenience. We've requested labs form Dr Julianne Rice office and will call you if you need to have these completed.   If you have labs (blood work) drawn today and your tests are completely normal, you  will receive your results only by: Marland Kitchen MyChart Message (if you have MyChart) OR . A paper copy in the mail If you have any lab test that is abnormal or we need to change your treatment, we will call you to review the results.  Follow-Up: At [facility], you and your health needs are our priority.  As part of our continuing mission to provide you with exceptional heart care, we have created designated Provider Care Teams.  These Care Teams include your primary Cardiologist (physician) and Advanced Practice Providers (APPs -  Physician Assistants and Nurse Practitioners) who all work together to provide you with the care you need, when you need it. You will need a follow up appointment in 12 months.  Please call our office 2 months in advance to schedule this appointment.  You may see Sanda Klein, MD or one of the following Advanced Practice Providers on your designated Care Team: [place], [place] . Fabian Sharp, PA-C    Signed, Sanda Klein, MD  02/04/2018 10:56 AM    [facility] [place], [place],   [address] Phone: [phone]; Fax: [phone]

## 2018-02-10 DIAGNOSIS — Z23 Encounter for immunization: Secondary | ICD-10-CM | POA: Diagnosis not present

## 2018-02-24 DIAGNOSIS — J4521 Mild intermittent asthma with (acute) exacerbation: Secondary | ICD-10-CM | POA: Diagnosis not present

## 2018-02-24 DIAGNOSIS — J019 Acute sinusitis, unspecified: Secondary | ICD-10-CM | POA: Diagnosis not present

## 2018-02-26 DIAGNOSIS — L609 Nail disorder, unspecified: Secondary | ICD-10-CM | POA: Diagnosis not present

## 2018-02-26 DIAGNOSIS — J4521 Mild intermittent asthma with (acute) exacerbation: Secondary | ICD-10-CM | POA: Diagnosis not present

## 2018-02-26 DIAGNOSIS — J019 Acute sinusitis, unspecified: Secondary | ICD-10-CM | POA: Diagnosis not present

## 2018-03-19 ENCOUNTER — Encounter: Payer: Self-pay | Admitting: Podiatry

## 2018-03-19 ENCOUNTER — Ambulatory Visit (INDEPENDENT_AMBULATORY_CARE_PROVIDER_SITE_OTHER): Payer: Medicare Other | Admitting: Podiatry

## 2018-03-19 DIAGNOSIS — B351 Tinea unguium: Secondary | ICD-10-CM | POA: Diagnosis not present

## 2018-03-19 DIAGNOSIS — M79676 Pain in unspecified toe(s): Secondary | ICD-10-CM | POA: Diagnosis not present

## 2018-03-19 NOTE — Progress Notes (Signed)
Patient ID: Michael Bryant, male   DOB: [birth year], 85 y.o.   MRN: [id number] Complaint:  Visit Type: Patient returns to my office for continued preventative foot care services. Complaint: Patient states" my nails have grown long and thick and become painful to walk and wear his shoes. . The patient presents for preventative foot care services. No changes to ROS  Podiatric Exam: Vascular: dorsalis pedis and posterior tibial pulses are palpable bilateral. Capillary return is immediate. Temperature gradient is WNL. Skin turgor WNL  Sensorium: Normal Semmes Weinstein monofilament test. Normal tactile sensation bilaterally. Nail Exam: Pt has thick disfigured discolored nails with subungual debris noted bilateral entire nail hallux through fifth toenails Ulcer Exam: There is no evidence of ulcer or pre-ulcerative changes or infection. Orthopedic Exam: Muscle tone and strength are WNL. No limitations in general ROM. No crepitus or effusions noted. Foot type and digits show no abnormalities. Bony prominences are unremarkable. Skin: No Porokeratosis. No infection or ulcers  Diagnosis:  Onychomycosis, , Pain in right toe, pain in left toes  Treatment & Plan Procedures and Treatment: Consent by patient was obtained for treatment procedures. The patient understood the discussion of treatment and procedures well. All questions were answered thoroughly reviewed. Debridement of mycotic and hypertrophic toenails, 1 through 5 bilateral and clearing of subungual debris. No ulceration, no infection noted. ABN signed for 2020 Return Visit-Office Procedure: Patient instructed to return to the office for a follow up visit 3 months for continued evaluation and treatment.  Gardiner Barefoot DPM

## 2018-03-20 ENCOUNTER — Other Ambulatory Visit: Payer: Self-pay | Admitting: Cardiovascular Disease

## 2018-03-20 NOTE — Telephone Encounter (Signed)
Rx has been sent to the pharmacy electronically. ° °

## 2018-03-27 DIAGNOSIS — L720 Epidermal cyst: Secondary | ICD-10-CM | POA: Diagnosis not present

## 2018-03-27 DIAGNOSIS — Z85828 Personal history of other malignant neoplasm of skin: Secondary | ICD-10-CM | POA: Diagnosis not present

## 2018-04-10 ENCOUNTER — Telehealth: Payer: Self-pay | Admitting: *Deleted

## 2018-04-10 DIAGNOSIS — L608 Other nail disorders: Secondary | ICD-10-CM | POA: Diagnosis not present

## 2018-04-10 NOTE — Telephone Encounter (Signed)
   [facility] Pre-operative Risk Assessment    Request for surgical clearance:  1. What type of surgery is being performed? Excisional biopsy melanonychia left thumb nailbed   2. When is this surgery scheduled? 04-21-2018   3. What type of clearance is required (medical clearance vs. Pharmacy clearance to hold med vs. Both)? medical  4. Are there any medications that need to be held prior to surgery and how long?none   5. Practice name and name of physician performing surgery? Dr Charlotte Crumb   6. What is your office phone number [phone]    7.   What is your office fax number [phone]  8.   Anesthesia type (None, local, MAC, general) ? general   Michael Bryant 04/10/2018, 5:46 PM  _________________________________________________________________   (provider comments below)

## 2018-04-11 NOTE — Telephone Encounter (Signed)
   Primary Cardiologist: Sanda Klein, MD  Chart reviewed as part of pre-operative protocol coverage. Given past medical history and time since last visit, based on ACC/AHA guidelines, Michael Bryant would be at acceptable risk for the planned procedure without further cardiovascular testing.   I will route this recommendation to the requesting party via Epic fax function and remove from pre-op pool.  Please call with questions.  Lyda Jester, PA-C 04/11/2018, 2:44 PM

## 2018-04-16 ENCOUNTER — Other Ambulatory Visit: Payer: Self-pay | Admitting: Orthopedic Surgery

## 2018-04-22 NOTE — Pre-Procedure Instructions (Signed)
Michael Bryant  04/22/2018      [id number] - [place], [place] - [address] [place] [address] Phone: [phone] Fax: [phone]    Your procedure is scheduled on February 14  Report to [place] at 1000 A.M.  Call this number if you have problems the morning of surgery:  [phone]   Remember:  Do not eat or drink after midnight.    Take these medicines the morning of surgery with A SIP OF WATER  amiodarone (PACERONE)  omeprazole (PRILOSEC)  nitroGLYCERIN (NITROSTAT if needed for chest pain  tamsulosin (FLOMAX)  7 days prior to surgery STOP taking any Aspirin (unless otherwise instructed by your surgeon), Aleve, Naproxen, Ibuprofen, Motrin, Advil, Goody's, BC's, all herbal medications, fish oil, and all vitamins.  Follow your surgeon's instructions on when to stop Asprin.  If no instructions were given by your surgeon then you will need to call the office to get those instructions.      Do not wear jewelry  Do not wear lotions, powders, or cologne, or deodorant.  Men may shave face and neck.  Do not bring valuables to the hospital.  [facility] is not responsible for any belongings or valuables.  Contacts, dentures or bridgework may not be worn into surgery.  Leave your suitcase in the car.  After surgery it may be brought to your room.  For patients admitted to the hospital, discharge time will be determined by your treatment team.  Patients discharged the day of surgery will not be allowed to drive home.    Special instructions:   [place]- Preparing For Surgery  Before surgery, you can play an important role. Because skin is not sterile, your skin needs to be as free of germs as possible. You can reduce the number of germs on your skin by washing with CHG (chlorahexidine gluconate) Soap before surgery.  CHG is an antiseptic cleaner which kills germs and  bonds with the skin to continue killing germs even after washing.    Oral Hygiene is also important to reduce your risk of infection.  Remember - BRUSH YOUR TEETH THE MORNING OF SURGERY WITH YOUR REGULAR TOOTHPASTE  Please do not use if you have an allergy to CHG or antibacterial soaps. If your skin becomes reddened/irritated stop using the CHG.  Do not shave (including legs and underarms) for at least 48 hours prior to first CHG shower. It is OK to shave your face.  Please follow these instructions carefully.   1. Shower the NIGHT BEFORE SURGERY and the MORNING OF SURGERY with CHG.   2. If you chose to wash your hair, wash your hair first as usual with your normal shampoo.  3. After you shampoo, rinse your hair and body thoroughly to remove the shampoo.  4. Use CHG as you would any other liquid soap. You can apply CHG directly to the skin and wash gently with a scrungie or a clean washcloth.   5. Apply the CHG Soap to your body ONLY FROM THE NECK DOWN.  Do not use on open wounds or open sores. Avoid contact with your eyes, ears, mouth and genitals (private parts). Wash Face and genitals (private parts)  with your normal soap.  6. Wash thoroughly, paying special attention to the area where your surgery will be performed.  7. Thoroughly rinse your body with warm water  from the neck down.  8. DO NOT shower/wash with your normal soap after using and rinsing off the CHG Soap.  9. Pat yourself dry with a CLEAN TOWEL.  10. Wear CLEAN PAJAMAS to bed the night before surgery, wear comfortable clothes the morning of surgery  11. Place CLEAN SHEETS on your bed the night of your first shower and DO NOT SLEEP WITH PETS.    Day of Surgery:  Do not apply any deodorants/lotions.  Please wear clean clothes to the hospital/surgery center.   Remember to brush your teeth WITH YOUR REGULAR TOOTHPASTE.    Please read over the following fact sheets that you were given.

## 2018-04-23 ENCOUNTER — Encounter (HOSPITAL_COMMUNITY): Payer: Self-pay

## 2018-04-23 ENCOUNTER — Encounter (HOSPITAL_COMMUNITY)
Admission: RE | Admit: 2018-04-23 | Discharge: 2018-04-23 | Disposition: A | Discharge status: Home / Self Care | Payer: Medicare Other | Source: Ambulatory Visit | Attending: Orthopedic Surgery | Admitting: Orthopedic Surgery

## 2018-04-23 ENCOUNTER — Other Ambulatory Visit: Payer: Self-pay

## 2018-04-23 DIAGNOSIS — Z01812 Encounter for preprocedural laboratory examination: Secondary | ICD-10-CM

## 2018-04-23 DIAGNOSIS — Z79899 Other long term (current) drug therapy: Secondary | ICD-10-CM | POA: Diagnosis not present

## 2018-04-23 DIAGNOSIS — I252 Old myocardial infarction: Secondary | ICD-10-CM | POA: Diagnosis not present

## 2018-04-23 DIAGNOSIS — Z8249 Family history of ischemic heart disease and other diseases of the circulatory system: Secondary | ICD-10-CM | POA: Diagnosis not present

## 2018-04-23 DIAGNOSIS — Z951 Presence of aortocoronary bypass graft: Secondary | ICD-10-CM | POA: Diagnosis not present

## 2018-04-23 DIAGNOSIS — Z809 Family history of malignant neoplasm, unspecified: Secondary | ICD-10-CM | POA: Diagnosis not present

## 2018-04-23 DIAGNOSIS — Z885 Allergy status to narcotic agent status: Secondary | ICD-10-CM | POA: Diagnosis not present

## 2018-04-23 DIAGNOSIS — E785 Hyperlipidemia, unspecified: Secondary | ICD-10-CM | POA: Diagnosis not present

## 2018-04-23 DIAGNOSIS — Z87891 Personal history of nicotine dependence: Secondary | ICD-10-CM | POA: Diagnosis not present

## 2018-04-23 DIAGNOSIS — Z7982 Long term (current) use of aspirin: Secondary | ICD-10-CM | POA: Diagnosis not present

## 2018-04-23 DIAGNOSIS — I2510 Atherosclerotic heart disease of native coronary artery without angina pectoris: Secondary | ICD-10-CM | POA: Diagnosis not present

## 2018-04-23 DIAGNOSIS — L608 Other nail disorders: Secondary | ICD-10-CM | POA: Diagnosis not present

## 2018-04-23 LAB — BASIC METABOLIC PANEL
Anion gap: 9 (ref 5–15)
BUN: 16 mg/dL (ref 8–23)
CO2: 28 mmol/L (ref 22–32)
Calcium: 9.1 mg/dL (ref 8.9–10.3)
Chloride: 102 mmol/L (ref 98–111)
Creatinine, Ser: 1 mg/dL (ref 0.61–1.24)
GFR calc Af Amer: >60 mL/min (ref >60)
GFR calc non Af Amer: >60 mL/min (ref >60)
Glucose, Bld: 137 mg/dL — ABNORMAL HIGH (ref 70–99)
Potassium: 4.2 mmol/L (ref 3.5–5.1)
SODIUM: 139 mmol/L (ref 135–145)

## 2018-04-23 LAB — CBC
HCT: 44.9 % (ref 39.0–52.0)
Hemoglobin: 14.6 g/dL (ref 13.0–17.0)
MCH: 30.5 pg (ref 26.0–34.0)
MCHC: 32.5 g/dL (ref 30.0–36.0)
MCV: 93.9 fL (ref 80.0–100.0)
Platelets: 203 10*3/uL (ref 150–400)
RBC: 4.78 MIL/uL (ref 4.22–5.81)
RDW: 14.3 % (ref 11.5–15.5)
WBC: 5.2 10*3/uL (ref 4.0–10.5)
nRBC: 0 % (ref 0.0–0.2)

## 2018-04-23 NOTE — Progress Notes (Signed)
PCP - Dr. [name] Cardiologist - Dr. Sallyanne Kuster  Chest x-ray - N/A EKG - 11/06/17 Stress Test - 08/21/15 ECHO - 03/25/13 Cardiac Cath - 07/02/13  Sleep Study - denies   Aspirin Instructions: patient to continue ASA per Dr. Burney Gauze  Anesthesia review: cardiac history  Patient denies shortness of breath, fever, cough and chest pain at PAT appointment   Patient verbalized understanding of instructions that were given to them at the PAT appointment. Patient was also instructed that they will need to review over the PAT instructions again at home before surgery.

## 2018-04-23 NOTE — Progress Notes (Signed)
   04/23/18 1343  OBSTRUCTIVE SLEEP APNEA  Have you ever been diagnosed with sleep apnea through a sleep study? No  Do you snore loudly (loud enough to be heard through closed doors)?  1  Do you often feel tired, fatigued, or sleepy during the daytime (such as falling asleep during driving or talking to someone)? 0  Has anyone observed you stop breathing during your sleep? 0  Do you have, or are you being treated for high blood pressure? 1  BMI more than 35 kg/m2? 0  Age > 50 (1-yes) 1  Neck circumference greater than:Male 16 inches or larger, Male 17inches or larger? 1  Male Gender (Yes=1) 1  Obstructive Sleep Apnea Score 5

## 2018-04-24 NOTE — Anesthesia Preprocedure Evaluation (Addendum)
Anesthesia Evaluation  Patient identified by MRN, date of birth, ID band Patient awake    Reviewed: Allergy & Precautions, H&P , NPO status , Patient's Chart, lab work & pertinent test results  Airway Mallampati: II   Neck ROM: full    Dental   Pulmonary former smoker,    breath sounds clear to auscultation       Cardiovascular hypertension, + CAD, + Past MI and + CABG  + dysrhythmias  Rhythm:regular Rate:Normal     Neuro/Psych    GI/Hepatic   Endo/Other    Renal/GU      Musculoskeletal   Abdominal   Peds  Hematology   Anesthesia Other Findings   Reproductive/Obstetrics                           Anesthesia Physical Anesthesia Plan  ASA: III  Anesthesia Plan: MAC   Post-op Pain Management:    Induction: Intravenous  PONV Risk Score and Plan: 1 and Propofol infusion, Ondansetron and Treatment may vary due to age or medical condition  Airway Management Planned: Simple Face Mask  Additional Equipment:   Intra-op Plan:   Post-operative Plan:   Informed Consent: I have reviewed the patients History and Physical, chart, labs and discussed the procedure including the risks, benefits and alternatives for the proposed anesthesia with the patient or authorized representative who has indicated his/her understanding and acceptance.       Plan Discussed with: CRNA, Anesthesiologist and Surgeon  Anesthesia Plan Comments: (See PAT note by Karoline Caldwell, PA-C )       Anesthesia Quick Evaluation

## 2018-04-24 NOTE — Progress Notes (Signed)
Anesthesia Chart Review:  Case:  [id number] Date/Time:  04/25/18 1145   Procedure:  EXCISIONAL BIOPSY LEFT THUMB NAILBED (Left )   Anesthesia type:  General   Pre-op diagnosis:  MELANONYCHIA LEFT THUMB   Location:  [place] OR ROOM 02 / [place] [facility]   Surgeon:  Charlotte Crumb, MD      DISCUSSION: 85 yo male former smoker. Pertinent hx includes CAD (s/p CABG 2003), Cardiac arrest (2003), NSVT (2015), loop recorder no longer functioning.  Follows with Dr. Sallyanne Kuster. Last OV 02/04/2018. Per note, pt doing well. "He remains very active for his age.  Over the summer was very busy tending to a large garden and involving a sleeping along with a push more.  He is now walking on his treadmill 6 days a week for about 30 minutes.  He feels well."  Cardiology summary copied from [place] note: He was initially seen for emergency CABG 12/16/2001 with a witnessed VF arrest , significant LAD disease with intraluminal thrombus. He underwent LIMA-LAD, VG-2 OM 1, VG-distal RCA.  In 03/2013 he underwent a stress Myoview which was negative for ischemia; EF 60%. In 06/2013 he had syncope following sexual intercourse. When 911 emergency services arrived was found to have extremely frequent PVCs and NSVT on the monitor.  He underwent cardiac catheterization on 07/02/13 which revealed: "Severe native coronary artery disease, graft dependent. Widely patent bypass grafts. Normal left ventricular systolic function. Beta blockers have been avoided due to bradycardia, but he tolerates low-dose amiodarone.  Cardiac clearance in telephone encounter 04/11/2018 by Lyda Jester, PA-C.  Anticipate he can proceed as planned barring acute status change.    VS: BP (!) 146/67   Pulse 65   Temp (!) 36.4 C   Resp 20   Ht 5\' 10"  (1.778 m)   Wt 85.3 kg   SpO2 98%   BMI 26.98 kg/m   PROVIDERS: [name], MD is PCP  Sanda Klein, MD is Cardiologist  LABS: Labs reviewed: Acceptable for surgery. (all labs ordered are listed, but only  abnormal results are displayed)  Labs Reviewed  BASIC METABOLIC PANEL - Abnormal; Notable for the following components:      Result Value   Glucose, Bld 137 (*)    All other components within normal limits  CBC    EKG: 11/06/2017: Sinus brady with 1st degree AV block. Rate 56.  CV: Event monitor 11/18/2017:  Normal sinus rhythm with normal circadian variation.  Rare PACs and rare PVCs.  TTE 08/11/2015: Study Conclusions  - Left ventricle: Global LV longitudinal strain was -16.5% The   cavity size was normal. There was moderate concentric   hypertrophy. Systolic function was normal. The estimated ejection   fraction was in the range of 55% to 60%. Possible hypokinesis of   the apical myocardium. There was an increased relative   contribution of atrial contraction to ventricular filling.   Doppler parameters are consistent with abnormal left ventricular   relaxation (grade 1 diastolic dysfunction). - Aortic valve: Trileaflet; mildly thickened, mildly calcified   leaflets. - Pulmonic valve: There was trivial regurgitation.   Nuclear stress 04/19/2014: IMPRESSION: 1. Minimal attenuation involving the inferior and to a lesser extent, the anterior walls of the left ventricle, without associated wall motion abnormalities. No definitive scintigraphic evidence of prior infarction or exercise induced ischemia. 2. Normal wall motion.  Ejection fraction - 60%   Past Medical History:  Diagnosis Date  . CAD (coronary artery disease)    a. s/p CABG (2003)  .  Cardiac arrest ([place]) 2003  . Hyperlipidemia   . NSVT (nonsustained ventricular tachycardia) ([place]) 06/16/2013    Past Surgical History:  Procedure Laterality Date  . CARDIAC CATHETERIZATION  12/15/2001   60% proximal LAD narrowing with active mobile thrombosis w/50% sequential areas,50-60% pro  . CORONARY ARTERY BYPASS GRAFT  2004   LIMA to LAD, SVG to OM, SVG to RCA  . LEFT HEART CATHETERIZATION WITH CORONARY/GRAFT ANGIOGRAM  N/A 07/02/2013   Procedure: LEFT HEART CATHETERIZATION WITH Beatrix Fetters;  Surgeon: Sanda Klein, MD;  Location: [place] CATH LAB;  Service: Cardiovascular;  Laterality: N/A;  . NM MYOCAR PERF WALL MOTION  12/09/2009   mod. perfusion defect basal inferior and mid inferior regions c/w  infarct/scar, EF 54%  . TONSILLECTOMY AND ADENOIDECTOMY      MEDICATIONS: . amiodarone (PACERONE) 200 MG tablet  . aspirin EC 81 MG tablet  . beta carotene w/minerals (OCUVITE) tablet  . chlorpheniramine (EQ CHLORTABS) 4 MG tablet  . Cholecalciferol (VITAMIN D) 50 MCG (2000 UT) CAPS  . CINNAMON PO  . DICLOFENAC PO  . fish oil-omega-3 fatty acids 1000 MG capsule  . GELATIN PO  . Glucosamine HCl (GLUCOSAMINE PO)  . Misc Natural Products (PROSTATE HEALTH) CAPS  . nitroGLYCERIN (NITROSTAT) 0.4 MG SL tablet  . omeprazole (PRILOSEC) 10 MG capsule  . Polyethyl Glycol-Propyl Glycol (SYSTANE OP)  . simvastatin (ZOCOR) 20 MG tablet  . tamsulosin (FLOMAX) 0.4 MG CAPS capsule  . trandolapril (MAVIK) 2 MG tablet   No current facility-administered medications for this encounter.       Wynonia Musty [facility] Short Stay Center/Anesthesiology Phone [phone] 04/24/2018 9:00 AM

## 2018-04-25 ENCOUNTER — Ambulatory Visit (HOSPITAL_COMMUNITY): Payer: Medicare Other | Admitting: Physician Assistant

## 2018-04-25 ENCOUNTER — Other Ambulatory Visit: Payer: Self-pay

## 2018-04-25 ENCOUNTER — Encounter (HOSPITAL_COMMUNITY): Payer: Self-pay

## 2018-04-25 ENCOUNTER — Ambulatory Visit (HOSPITAL_COMMUNITY)
Admission: RE | Admit: 2018-04-25 | Discharge: 2018-04-25 | Disposition: A | Discharge status: Home / Self Care | Payer: Medicare Other | Attending: Orthopedic Surgery | Admitting: Orthopedic Surgery

## 2018-04-25 ENCOUNTER — Encounter (HOSPITAL_COMMUNITY)
Admission: RE | Disposition: A | Discharge status: Home / Self Care | Payer: Self-pay | Source: Home / Self Care | Attending: Orthopedic Surgery

## 2018-04-25 ENCOUNTER — Ambulatory Visit (HOSPITAL_COMMUNITY): Payer: Medicare Other | Admitting: Certified Registered Nurse Anesthetist

## 2018-04-25 DIAGNOSIS — Z7982 Long term (current) use of aspirin: Secondary | ICD-10-CM | POA: Insufficient documentation

## 2018-04-25 DIAGNOSIS — Z87891 Personal history of nicotine dependence: Secondary | ICD-10-CM | POA: Diagnosis not present

## 2018-04-25 DIAGNOSIS — Z8249 Family history of ischemic heart disease and other diseases of the circulatory system: Secondary | ICD-10-CM | POA: Insufficient documentation

## 2018-04-25 DIAGNOSIS — Z79899 Other long term (current) drug therapy: Secondary | ICD-10-CM | POA: Insufficient documentation

## 2018-04-25 DIAGNOSIS — I2510 Atherosclerotic heart disease of native coronary artery without angina pectoris: Secondary | ICD-10-CM | POA: Insufficient documentation

## 2018-04-25 DIAGNOSIS — Z951 Presence of aortocoronary bypass graft: Secondary | ICD-10-CM | POA: Diagnosis not present

## 2018-04-25 DIAGNOSIS — Z885 Allergy status to narcotic agent status: Secondary | ICD-10-CM | POA: Insufficient documentation

## 2018-04-25 DIAGNOSIS — L608 Other nail disorders: Secondary | ICD-10-CM | POA: Insufficient documentation

## 2018-04-25 DIAGNOSIS — E785 Hyperlipidemia, unspecified: Secondary | ICD-10-CM | POA: Diagnosis not present

## 2018-04-25 DIAGNOSIS — Z809 Family history of malignant neoplasm, unspecified: Secondary | ICD-10-CM | POA: Insufficient documentation

## 2018-04-25 DIAGNOSIS — I252 Old myocardial infarction: Secondary | ICD-10-CM | POA: Insufficient documentation

## 2018-04-25 DIAGNOSIS — I10 Essential (primary) hypertension: Secondary | ICD-10-CM | POA: Diagnosis not present

## 2018-04-25 SURGERY — EXCISION OF NAIL MATRIX BED
Anesthesia: Monitor Anesthesia Care | Site: Arm Lower | Laterality: Left

## 2018-04-25 MED ORDER — OXYCODONE HCL 5 MG/5ML PO SOLN: 5.0000 mg | Freq: Once | ORAL | Status: DC | PRN

## 2018-04-25 MED ORDER — LIDOCAINE HCL 2 % IJ SOLN
INTRAMUSCULAR | Status: AC
  Filled 2018-04-25: qty 20

## 2018-04-25 MED ORDER — CHLORHEXIDINE GLUCONATE 4 % EX LIQD: 60.0000 mL | Freq: Once | CUTANEOUS | Status: DC

## 2018-04-25 MED ORDER — PROPOFOL 10 MG/ML IV BOLUS
INTRAVENOUS | Status: DC | PRN
  Administered 2018-04-25: 30 mg via INTRAVENOUS
  Administered 2018-04-25: 10 mg via INTRAVENOUS

## 2018-04-25 MED ORDER — ONDANSETRON HCL 4 MG/2ML IJ SOLN: 4.0000 mg | Freq: Four times a day (QID) | INTRAMUSCULAR | Status: DC | PRN

## 2018-04-25 MED ORDER — ONDANSETRON HCL 4 MG/2ML IJ SOLN
INTRAMUSCULAR | Status: DC | PRN
  Administered 2018-04-25: 4 mg via INTRAVENOUS

## 2018-04-25 MED ORDER — BUPIVACAINE HCL (PF) 0.25 % IJ SOLN
INTRAMUSCULAR | Status: AC
  Filled 2018-04-25: qty 30

## 2018-04-25 MED ORDER — FENTANYL CITRATE (PF) 250 MCG/5ML IJ SOLN
INTRAMUSCULAR | Status: DC | PRN
  Administered 2018-04-25: 50 ug via INTRAVENOUS

## 2018-04-25 MED ORDER — FENTANYL CITRATE (PF) 250 MCG/5ML IJ SOLN
INTRAMUSCULAR | Status: AC
  Filled 2018-04-25: qty 5

## 2018-04-25 MED ORDER — CEFAZOLIN SODIUM-DEXTROSE 2-4 GM/100ML-% IV SOLN
2.0000 g | INTRAVENOUS | Status: AC
  Administered 2018-04-25: 2 g via INTRAVENOUS

## 2018-04-25 MED ORDER — PROPOFOL 500 MG/50ML IV EMUL
INTRAVENOUS | Status: DC | PRN
  Administered 2018-04-25: 100 ug/kg/min via INTRAVENOUS

## 2018-04-25 MED ORDER — LACTATED RINGERS IV SOLN
INTRAVENOUS | Status: DC
  Administered 2018-04-25: 11:00:00 via INTRAVENOUS

## 2018-04-25 MED ORDER — CEFAZOLIN SODIUM-DEXTROSE 2-4 GM/100ML-% IV SOLN
INTRAVENOUS | Status: AC
  Filled 2018-04-25: qty 100

## 2018-04-25 MED ORDER — OXYCODONE HCL 5 MG PO TABS: 5.0000 mg | ORAL_TABLET | Freq: Once | ORAL | Status: DC | PRN

## 2018-04-25 MED ORDER — SODIUM CHLORIDE 0.9 % IR SOLN
Status: DC | PRN
  Administered 2018-04-25: 1000 mL

## 2018-04-25 MED ORDER — FENTANYL CITRATE (PF) 100 MCG/2ML IJ SOLN: 25.0000 ug | INTRAMUSCULAR | Status: DC | PRN

## 2018-04-25 MED ORDER — LIDOCAINE HCL 2 % IJ SOLN
INTRAMUSCULAR | Status: DC | PRN
  Administered 2018-04-25: 7 mL

## 2018-04-25 SURGICAL SUPPLY — 52 items
BANDAGE ACE 3X5.8 VEL STRL LF (GAUZE/BANDAGES/DRESSINGS) ×1 IMPLANT
BANDAGE ACE 4X5 VEL STRL LF (GAUZE/BANDAGES/DRESSINGS) ×1 IMPLANT
BNDG CMPR 9X4 STRL LF SNTH (GAUZE/BANDAGES/DRESSINGS) ×1
BNDG COHESIVE 3X5 TAN STRL LF (GAUZE/BANDAGES/DRESSINGS) ×2 IMPLANT
BNDG CONFORM 2 STRL LF (GAUZE/BANDAGES/DRESSINGS) IMPLANT
BNDG ESMARK 4X9 LF (GAUZE/BANDAGES/DRESSINGS) ×2 IMPLANT
BNDG GAUZE ELAST 4 BULKY (GAUZE/BANDAGES/DRESSINGS) ×2 IMPLANT
CORDS BIPOLAR (ELECTRODE) IMPLANT
COVER SURGICAL LIGHT HANDLE (MISCELLANEOUS) ×4 IMPLANT
COVER WAND RF STERILE (DRAPES) ×1 IMPLANT
CUFF TOURNIQUET SINGLE 18IN (TOURNIQUET CUFF) ×3 IMPLANT
DECANTER SPIKE VIAL GLASS SM (MISCELLANEOUS) ×3 IMPLANT
DRAPE SURG 17X23 STRL (DRAPES) ×3 IMPLANT
DRSG TELFA 3X8 NADH (GAUZE/BANDAGES/DRESSINGS) ×3 IMPLANT
DRSG XEROFORM 1X8 (GAUZE/BANDAGES/DRESSINGS) ×2 IMPLANT
DURAPREP 26ML APPLICATOR (WOUND CARE) ×3 IMPLANT
GAUZE PACKING IODOFORM 1/4X15 (GAUZE/BANDAGES/DRESSINGS) IMPLANT
GAUZE PACKING IODOFORM 1/4X5 (PACKING) IMPLANT
GAUZE SPONGE 4X4 12PLY STRL (GAUZE/BANDAGES/DRESSINGS) ×2 IMPLANT
GAUZE SPONGE 4X4 12PLY STRL LF (GAUZE/BANDAGES/DRESSINGS) ×2 IMPLANT
GAUZE XEROFORM 1X8 LF (GAUZE/BANDAGES/DRESSINGS) ×3 IMPLANT
GLOVE SURG SYN 8.0 (GLOVE) ×3 IMPLANT
GLOVE SURG SYN 8.0 PF PI (GLOVE) ×1 IMPLANT
GOWN STRL REUS W/ TWL LRG LVL3 (GOWN DISPOSABLE) ×1 IMPLANT
GOWN STRL REUS W/ TWL XL LVL3 (GOWN DISPOSABLE) ×1 IMPLANT
GOWN STRL REUS W/TWL LRG LVL3 (GOWN DISPOSABLE) ×6
GOWN STRL REUS W/TWL XL LVL3 (GOWN DISPOSABLE)
KIT BASIN OR (CUSTOM PROCEDURE TRAY) ×3 IMPLANT
KIT TURNOVER KIT B (KITS) ×3 IMPLANT
MANIFOLD NEPTUNE II (INSTRUMENTS) ×1 IMPLANT
NDL HYPO 25GX1X1/2 BEV (NEEDLE) IMPLANT
NDL HYPO 25X1 1.5 SAFETY (NEEDLE) ×1 IMPLANT
NEEDLE HYPO 25GX1X1/2 BEV (NEEDLE) IMPLANT
NEEDLE HYPO 25X1 1.5 SAFETY (NEEDLE) ×3 IMPLANT
NS IRRIG 1000ML POUR BTL (IV SOLUTION) ×3 IMPLANT
PACK ORTHO EXTREMITY (CUSTOM PROCEDURE TRAY) ×3 IMPLANT
PAD ARMBOARD 7.5X6 YLW CONV (MISCELLANEOUS) ×4 IMPLANT
PAD CAST 4YDX4 CTTN HI CHSV (CAST SUPPLIES) ×2 IMPLANT
PAD DRESSING TELFA 3X8 NADH (GAUZE/BANDAGES/DRESSINGS) IMPLANT
PADDING CAST COTTON 4X4 STRL (CAST SUPPLIES)
SPONGE LAP 18X18 X RAY DECT (DISPOSABLE) ×1 IMPLANT
SUT CHROMIC 5 0 RB 1 27 (SUTURE) ×2 IMPLANT
SUT VICRYL RAPIDE 4/0 PS 2 (SUTURE) IMPLANT
SWAB COLLECTION DEVICE MRSA (MISCELLANEOUS) ×3 IMPLANT
SWAB CULTURE ESWAB REG 1ML (MISCELLANEOUS) IMPLANT
SYR CONTROL 10ML LL (SYRINGE) ×3 IMPLANT
TOWEL OR 17X24 6PK STRL BLUE (TOWEL DISPOSABLE) ×1 IMPLANT
TOWEL OR 17X26 10 PK STRL BLUE (TOWEL DISPOSABLE) ×3 IMPLANT
TUBE CONNECTING 12'X1/4 (SUCTIONS) ×1
TUBE CONNECTING 12X1/4 (SUCTIONS) ×2 IMPLANT
UNDERPAD 30X30 (UNDERPADS AND DIAPERS) ×3 IMPLANT
YANKAUER SUCT BULB TIP NO VENT (SUCTIONS) ×3 IMPLANT

## 2018-04-25 NOTE — H&P (Signed)
Michael Bryant is an 85 y.o. male.   Chief Complaint: Left thumb melanotic streak HPI: Patient is a very pleasant 85 year old male with melanotic streak left thumb that is been there for several months  Past Medical History:  Diagnosis Date  . CAD (coronary artery disease)    a. s/p CABG (2003)  . Cardiac arrest ([place]) 2003  . Hyperlipidemia   . NSVT (nonsustained ventricular tachycardia) ([place]) 06/16/2013    Past Surgical History:  Procedure Laterality Date  . CARDIAC CATHETERIZATION  12/15/2001   60% proximal LAD narrowing with active mobile thrombosis w/50% sequential areas,50-60% pro  . CORONARY ARTERY BYPASS GRAFT  2004   LIMA to LAD, SVG to OM, SVG to RCA  . LEFT HEART CATHETERIZATION WITH CORONARY/GRAFT ANGIOGRAM N/A 07/02/2013   Procedure: LEFT HEART CATHETERIZATION WITH Beatrix Fetters;  Surgeon: Sanda Klein, MD;  Location: [place] CATH LAB;  Service: Cardiovascular;  Laterality: N/A;  . NM MYOCAR PERF WALL MOTION  12/09/2009   mod. perfusion defect basal inferior and mid inferior regions c/w  infarct/scar, EF 54%  . TONSILLECTOMY AND ADENOIDECTOMY      Family History  Problem Relation Age of Onset  . Cancer Mother 17  . CAD Father 7       Died age 33  . Heart disease Father    Social History:  reports that he has quit smoking. His smoking use included cigarettes. He has quit using smokeless tobacco. He reports that he does not drink alcohol or use drugs.  Allergies:  Allergies  Allergen Reactions  . Codeine Other (See Comments) and Hives    "wont go to sleep"    Medications Prior to Admission  Medication Sig Dispense Refill  . amiodarone (PACERONE) 200 MG tablet TAKE ONE-HALF TABLET BY MOUTH DAILY (Patient taking differently: Take 100 mg by mouth daily. ) 45 tablet 2  . aspirin EC 81 MG tablet Take 81 mg by mouth daily.    . beta carotene w/minerals (OCUVITE) tablet Take 1 tablet by mouth daily.    . chlorpheniramine (EQ CHLORTABS) 4 MG tablet Take 4 mg by  mouth daily as needed (allergies).     . Cholecalciferol (VITAMIN D) 50 MCG (2000 UT) CAPS Take 2,000 Units by mouth daily.    Marland Kitchen CINNAMON PO Take 1 tablet by mouth daily.     Marland Kitchen DICLOFENAC PO Take 100 mg by mouth daily.     . fish oil-omega-3 fatty acids 1000 MG capsule Take 1 g by mouth daily.     Marland Kitchen GELATIN PO Take 9 g by mouth daily.    . Glucosamine HCl (GLUCOSAMINE PO) Take 1 tablet by mouth daily.     . Misc Natural Products (PROSTATE HEALTH) CAPS Take 1 capsule by mouth daily.    Marland Kitchen omeprazole (PRILOSEC) 10 MG capsule Take 10 mg by mouth daily.    [name] (SYSTANE OP) Place 1 drop into both eyes daily as needed (dry eyes).    . simvastatin (ZOCOR) 20 MG tablet TAKE 1 TABLET(20 MG) BY MOUTH AT BEDTIME (Patient taking differently: Take 20 mg by mouth at bedtime. ) 90 tablet 2  . tamsulosin (FLOMAX) 0.4 MG CAPS capsule Take 0.4 mg by mouth daily after supper.     . trandolapril (MAVIK) 2 MG tablet TAKE 1 TABLET BY MOUTH EVERY DAY (Patient taking differently: Take 2 mg by mouth daily. ) 90 tablet 2  . nitroGLYCERIN (NITROSTAT) 0.4 MG SL tablet Place 1 tablet (0.4 mg total) under  the tongue every 5 (five) minutes as needed for chest pain. 25 tablet 2    No results found for this or any previous visit (from the past 48 hour(s)). No results found.  Review of Systems  All other systems reviewed and are negative.   Blood pressure (!) 157/80, pulse 71, temperature 97.6 F (36.4 C), temperature source Oral, resp. rate 20, height 5\' 10"  (1.778 m), weight 85.3 kg, SpO2 96 %. Physical Exam  Constitutional: He is oriented to person, place, and time. He appears well-developed and well-nourished.  HENT:  Head: Normocephalic and atraumatic.  Neck: Normal range of motion.  Cardiovascular: Normal rate.  Respiratory: Effort normal.  Musculoskeletal:     Left hand: He exhibits deformity.     Comments: Left thumb melanotic streak  Neurological: He is alert and oriented to  person, place, and time.  Skin: Skin is warm.  Psychiatric: He has a normal mood and affect. His behavior is normal. Judgment and thought content normal.     Assessment/Plan 85 year old male with melanotic streak left thumb times several months.  Have discussed the role of nailbed biopsy as an outpatient.  Patient understands risks and benefits and wishes to proceed  Michael Amor, MD 04/25/2018, 12:14 PM

## 2018-04-25 NOTE — Transfer of Care (Signed)
Immediate Anesthesia Transfer of Care Note  Patient: Michael Bryant  Procedure(s) Performed: EXCISIONAL BIOPSY LEFT THUMB NAILBED (Left Arm Lower)  Patient Location: PACU  Anesthesia Type:MAC  Level of Consciousness: awake, alert  and oriented  Airway & Oxygen Therapy: Patient Spontanous Breathing  Post-op Assessment: Report given to RN and Post -op Vital signs reviewed and stable  Post vital signs: Reviewed and stable  Last Vitals:  Vitals Value Taken Time  BP 157/82 04/25/2018  1:02 PM  Temp    Pulse 60 04/25/2018  1:03 PM  Resp 13 04/25/2018  1:02 PM  SpO2 97 % 04/25/2018  1:03 PM  Vitals shown include unvalidated device data.  Last Pain:  Vitals:   04/25/18 1030  TempSrc:   PainSc: 0-No pain         Complications: No apparent anesthesia complications

## 2018-04-25 NOTE — Op Note (Signed)
NAME: Michael Bryant, Michael Bryant. MEDICAL RECORD [id number] ACCOUNT [id number] DATE OF BIRTH:1933-07-01 FACILITY: [facility] LOCATION: [facility] PHYSICIAN:Mabrey Howland A. Burney Gauze, MD  OPERATIVE REPORT  DATE OF PROCEDURE:  04/25/2018  PREOPERATIVE DIAGNOSIS:  Left thumb melanotic streak.  POSTOPERATIVE DIAGNOSIS:  Left thumb melanotic streak.  PROCEDURE:  Nail bed biopsy, left thumb.  SURGEON:  Charlotte Crumb, MD  ASSISTANT:  None.  ANESTHESIA:  Monitored anesthesia care with 2% lidocaine plain digital block performed by the surgeon.  COMPLICATIONS:  No complications.  DRAINS:  No drains.  SPECIMENS:  One specimen sent.  DESCRIPTION OF PROCEDURE:  The patient was taken to the operating suite after induction of adequate IV sedation.  Left upper extremity was prepped and draped in the usual sterile fashion.  At this point in time, 7 mL of 2% lidocaine plain was injected in  and around the thumb base both dorsally and palmarly.  We then raised the tourniquet to 250 mm.  We then took a Soil scientist and carefully elevated the nail plate off the underlying nail bed.  There was an area of nail bed distally over the sterile  matrix that was darkened and was biopsied with a #15 blade down to the underlying distal phalanx.  This was sent for pathologic confirmation.  The entire streaked area was excised.  We then carefully elevated the remaining nail bed using a Freer elevator  off the distal phalanx and closed it primarily with no graft using a 6-0 chromic x3 sutures.  We then irrigated and placed the nail plate back into the eponychial fold.  We placed with Xeroform, 4 x 4's and Coban wrap.  The patient tolerated this  procedure well and went to recovery room in stable fashion.  TN/NUANCE  Bryant:04/25/2018 T:04/25/2018 JOB:[id number]

## 2018-04-25 NOTE — Op Note (Signed)
Please see dictated report [phone]

## 2018-04-26 NOTE — Anesthesia Postprocedure Evaluation (Signed)
Anesthesia Post Note  Patient: Michael Bryant  Procedure(s) Performed: EXCISIONAL BIOPSY LEFT THUMB NAILBED (Left Arm Lower)     Patient location during evaluation: PACU Anesthesia Type: MAC Level of consciousness: awake and alert Pain management: pain level controlled Vital Signs Assessment: post-procedure vital signs reviewed and stable Respiratory status: spontaneous breathing, nonlabored ventilation, respiratory function stable and patient connected to nasal cannula oxygen Cardiovascular status: stable and blood pressure returned to baseline Postop Assessment: no apparent nausea or vomiting Anesthetic complications: no    Last Vitals:  Vitals:   04/25/18 1320 04/25/18 1332  BP: (!) 166/75 (!) 157/68  Pulse: (!) 55 (!) 45  Resp: 20   Temp: 36.7 C   SpO2: 93% 97%    Last Pain:  Vitals:   04/25/18 1332  TempSrc:   PainSc: 0-No pain                 Orion Mole S

## 2018-05-26 DIAGNOSIS — E119 Type 2 diabetes mellitus without complications: Secondary | ICD-10-CM | POA: Diagnosis not present

## 2018-05-26 DIAGNOSIS — E7800 Pure hypercholesterolemia, unspecified: Secondary | ICD-10-CM | POA: Diagnosis not present

## 2018-05-26 DIAGNOSIS — I10 Essential (primary) hypertension: Secondary | ICD-10-CM | POA: Diagnosis not present

## 2018-06-02 DIAGNOSIS — E7800 Pure hypercholesterolemia, unspecified: Secondary | ICD-10-CM | POA: Diagnosis not present

## 2018-06-02 DIAGNOSIS — I10 Essential (primary) hypertension: Secondary | ICD-10-CM | POA: Diagnosis not present

## 2018-06-02 DIAGNOSIS — E119 Type 2 diabetes mellitus without complications: Secondary | ICD-10-CM | POA: Diagnosis not present

## 2018-06-18 ENCOUNTER — Ambulatory Visit: Payer: Medicare Other | Admitting: Podiatry

## 2018-08-06 ENCOUNTER — Other Ambulatory Visit: Payer: Self-pay

## 2018-08-06 ENCOUNTER — Encounter: Payer: Self-pay | Admitting: Podiatry

## 2018-08-06 ENCOUNTER — Ambulatory Visit (INDEPENDENT_AMBULATORY_CARE_PROVIDER_SITE_OTHER): Payer: Medicare Other | Admitting: Podiatry

## 2018-08-06 VITALS — Temp 98.4°F

## 2018-08-06 DIAGNOSIS — M79676 Pain in unspecified toe(s): Secondary | ICD-10-CM

## 2018-08-06 DIAGNOSIS — B351 Tinea unguium: Secondary | ICD-10-CM

## 2018-08-06 NOTE — Progress Notes (Signed)
Patient ID: Michael Bryant, male   DOB: [birth year], 85 y.o.   MRN: [id number] Complaint:  Visit Type: Patient returns to my office for continued preventative foot care services. Complaint: Patient states" my nails have grown long and thick and become painful to walk and wear his shoes. . The patient presents for preventative foot care services. No changes to ROS  Podiatric Exam: Vascular: dorsalis pedis and posterior tibial pulses are palpable bilateral. Capillary return is immediate. Temperature gradient is WNL. Skin turgor WNL  Sensorium: Normal Semmes Weinstein monofilament test. Normal tactile sensation bilaterally. Nail Exam: Pt has thick disfigured discolored nails with subungual debris noted bilateral entire nail hallux through fifth toenails Ulcer Exam: There is no evidence of ulcer or pre-ulcerative changes or infection. Orthopedic Exam: Muscle tone and strength are WNL. No limitations in general ROM. No crepitus or effusions noted. Foot type and digits show no abnormalities. Bony prominences are unremarkable. Skin: No Porokeratosis. No infection or ulcers  Diagnosis:  Onychomycosis, , Pain in right toe, pain in left toes  Treatment & Plan Procedures and Treatment: Consent by patient was obtained for treatment procedures. The patient understood the discussion of treatment and procedures well. All questions were answered thoroughly reviewed. Debridement of mycotic and hypertrophic toenails, 1 through 5 bilateral and clearing of subungual debris. No ulceration, no infection noted.  Return Visit-Office Procedure: Patient instructed to return to the office for a follow up visit 3 months for continued evaluation and treatment.  Gardiner Barefoot DPM

## 2018-11-04 DIAGNOSIS — Z125 Encounter for screening for malignant neoplasm of prostate: Secondary | ICD-10-CM | POA: Diagnosis not present

## 2018-11-04 DIAGNOSIS — E7800 Pure hypercholesterolemia, unspecified: Secondary | ICD-10-CM | POA: Diagnosis not present

## 2018-11-04 DIAGNOSIS — I10 Essential (primary) hypertension: Secondary | ICD-10-CM | POA: Diagnosis not present

## 2018-11-04 DIAGNOSIS — E119 Type 2 diabetes mellitus without complications: Secondary | ICD-10-CM | POA: Diagnosis not present

## 2018-11-11 ENCOUNTER — Ambulatory Visit (INDEPENDENT_AMBULATORY_CARE_PROVIDER_SITE_OTHER): Payer: Medicare Other | Admitting: Podiatry

## 2018-11-11 ENCOUNTER — Encounter: Payer: Self-pay | Admitting: Podiatry

## 2018-11-11 ENCOUNTER — Other Ambulatory Visit: Payer: Self-pay

## 2018-11-11 DIAGNOSIS — M79676 Pain in unspecified toe(s): Secondary | ICD-10-CM

## 2018-11-11 DIAGNOSIS — Z0000 Encounter for general adult medical examination without abnormal findings: Secondary | ICD-10-CM | POA: Diagnosis not present

## 2018-11-11 DIAGNOSIS — I2510 Atherosclerotic heart disease of native coronary artery without angina pectoris: Secondary | ICD-10-CM | POA: Diagnosis not present

## 2018-11-11 DIAGNOSIS — E119 Type 2 diabetes mellitus without complications: Secondary | ICD-10-CM | POA: Diagnosis not present

## 2018-11-11 DIAGNOSIS — I10 Essential (primary) hypertension: Secondary | ICD-10-CM | POA: Diagnosis not present

## 2018-11-11 DIAGNOSIS — B351 Tinea unguium: Secondary | ICD-10-CM

## 2018-11-11 DIAGNOSIS — E7800 Pure hypercholesterolemia, unspecified: Secondary | ICD-10-CM | POA: Diagnosis not present

## 2018-11-11 DIAGNOSIS — R972 Elevated prostate specific antigen [PSA]: Secondary | ICD-10-CM | POA: Diagnosis not present

## 2018-11-11 NOTE — Progress Notes (Signed)
Patient ID: Michael Bryant, male   DOB: Jun 09, 1933, 85 y.o.   MRN: [id number] Complaint:  Visit Type: Patient returns to my office for continued preventative foot care services. Complaint: Patient states" my nails have grown long and thick and become painful to walk and wear his shoes. . The patient presents for preventative foot care services. No changes to ROS  Podiatric Exam: Vascular: dorsalis pedis and posterior tibial pulses are palpable bilateral. Capillary return is immediate. Temperature gradient is WNL. Skin turgor WNL  Sensorium: Normal Semmes Weinstein monofilament test. Normal tactile sensation bilaterally. Nail Exam: Pt has thick disfigured discolored nails with subungual debris noted bilateral entire nail hallux through fifth toenails Ulcer Exam: There is no evidence of ulcer or pre-ulcerative changes or infection. Orthopedic Exam: Muscle tone and strength are WNL. No limitations in general ROM. No crepitus or effusions noted. Foot type and digits show no abnormalities. Bony prominences are unremarkable. Skin: No Porokeratosis. No infection or ulcers  Diagnosis:  Onychomycosis, , Pain in right toe, pain in left toes  Treatment & Plan Procedures and Treatment: Consent by patient was obtained for treatment procedures. The patient understood the discussion of treatment and procedures well. All questions were answered thoroughly reviewed. Debridement of mycotic and hypertrophic toenails, 1 through 5 bilateral and clearing of subungual debris. No ulceration, no infection noted.  Return Visit-Office Procedure: Patient instructed to return to the office for a follow up visit 3 months for continued evaluation and treatment.  Gardiner Barefoot DPM

## 2018-12-11 ENCOUNTER — Other Ambulatory Visit: Payer: Self-pay

## 2018-12-11 MED ORDER — SIMVASTATIN 20 MG PO TABS: ORAL_TABLET | ORAL | 2 refills | Status: DC

## 2018-12-11 MED ORDER — TRANDOLAPRIL 2 MG PO TABS: 2.0000 mg | ORAL_TABLET | Freq: Every day | ORAL | 2 refills | Status: DC

## 2018-12-11 MED ORDER — AMIODARONE HCL 200 MG PO TABS: 100.0000 mg | ORAL_TABLET | Freq: Every day | ORAL | 2 refills | Status: DC

## 2019-01-27 ENCOUNTER — Encounter: Payer: Self-pay | Admitting: Cardiovascular Disease

## 2019-01-27 ENCOUNTER — Other Ambulatory Visit: Payer: Self-pay

## 2019-01-27 ENCOUNTER — Ambulatory Visit (INDEPENDENT_AMBULATORY_CARE_PROVIDER_SITE_OTHER): Payer: Medicare Other | Admitting: Cardiovascular Disease

## 2019-01-27 VITALS — BP 126/62 | HR 55 | Temp 97.3°F | Ht 70.0 in | Wt 178.0 lb

## 2019-01-27 DIAGNOSIS — I472 Ventricular tachycardia: Secondary | ICD-10-CM

## 2019-01-27 DIAGNOSIS — I2510 Atherosclerotic heart disease of native coronary artery without angina pectoris: Secondary | ICD-10-CM

## 2019-01-27 DIAGNOSIS — E7800 Pure hypercholesterolemia, unspecified: Secondary | ICD-10-CM

## 2019-01-27 DIAGNOSIS — Z79899 Other long term (current) drug therapy: Secondary | ICD-10-CM

## 2019-01-27 DIAGNOSIS — Z5181 Encounter for therapeutic drug level monitoring: Secondary | ICD-10-CM | POA: Diagnosis not present

## 2019-01-27 DIAGNOSIS — I4729 Other ventricular tachycardia: Secondary | ICD-10-CM

## 2019-01-27 DIAGNOSIS — I10 Essential (primary) hypertension: Secondary | ICD-10-CM | POA: Diagnosis not present

## 2019-01-27 NOTE — Patient Instructions (Signed)

## 2019-01-27 NOTE — Progress Notes (Signed)
Cardiology Office Note    Date:  01/27/2019   ID:  Michael Bryant, DOB April 14, 1933, MRN ID:[id number]  PCP:  [name], MD  Cardiologist:   Sanda Klein, MD   No chief complaint on file.   History of Present Illness:  Michael Bryant is a 85 y.o. male with a remote history of coronary artery disease presenting as cardiac arrest in 2003 and leading to bypass surgery, near-syncope related to nonsustained ventricular tachycardia, no recurrence on Amiodarone, normal left ventricular systolic function by echo (last checked June 2017).  He is doing very well from a cardiac point of view.  Has not had any episodes of syncope or near syncope and denies excessive fatigue, shortness of breath at rest or with activity, chest pain at rest or with exertion, edema, claudication, focal neurological events.  He very rarely has palpitations.  He continues to work in his garden on a almost daily basis, without limitations.  He is quite melancholy today since he attended the funeral of his last surviving sibling (of a total of 31) just yesterday.  His brother died in a nursing home and he was unable to visit him due to the coronavirus pandemic.  He was initially seen for emergency CABG 12/16/2001 with a witnessed VF arrest , significant LAD disease with intraluminal thrombus. He underwent LIMA-LAD, VG-2 OM 1, VG-distal RCA.  In 03/2013 he underwent a stress Myoview which was negative for ischemia; EF 60%. In 06/2013 he had syncope following sexual intercourse. When 911 emergency services arrived was found to have extremely frequent PVCs and NSVT on the monitor.  He underwent cardiac catheterization on 07/02/13 which revealed: "Severe native coronary artery disease, graft dependent. Widely patent bypass grafts. Normal left ventricular systolic function. Beta blockers have been avoided due to bradycardia, but he tolerates low-dose amiodarone. Has an ILR, but the device reached RRT on 11/20/2016. Event monitor for  palpitations in September 2019 showed rare PACs and rare PVCs, normal rhythm during subjective "racing heartbeat".  Past Medical History:  Diagnosis Date  . CAD (coronary artery disease)    a. s/p CABG (2003)  . Cardiac arrest ([place]) 2003  . Hyperlipidemia   . NSVT (nonsustained ventricular tachycardia) ([place]) 06/16/2013    Past Surgical History:  Procedure Laterality Date  . CARDIAC CATHETERIZATION  12/15/2001   60% proximal LAD narrowing with active mobile thrombosis w/50% sequential areas,50-60% pro  . CORONARY ARTERY BYPASS GRAFT  2004   LIMA to LAD, SVG to OM, SVG to RCA  . LEFT HEART CATHETERIZATION WITH CORONARY/GRAFT ANGIOGRAM N/A 07/02/2013   Procedure: LEFT HEART CATHETERIZATION WITH Beatrix Fetters;  Surgeon: Sanda Klein, MD;  Location: [facility];  Service: Cardiovascular;  Laterality: N/A;  . NM MYOCAR PERF WALL MOTION  12/09/2009   mod. perfusion defect basal inferior and mid inferior regions c/w  infarct/scar, EF 54%  . TONSILLECTOMY AND ADENOIDECTOMY      Current Medications: Outpatient Medications Prior to Visit  Medication Sig Dispense Refill  . amiodarone (PACERONE) 200 MG tablet Take 0.5 tablets (100 mg total) by mouth daily. 45 tablet 2  . aspirin EC 81 MG tablet Take 81 mg by mouth daily.    . beta carotene w/minerals (OCUVITE) tablet Take 1 tablet by mouth daily.    . chlorpheniramine (EQ CHLORTABS) 4 MG tablet Take 4 mg by mouth daily as needed (allergies).     . Cholecalciferol (VITAMIN D) 50 MCG (2000 UT) CAPS Take 2,000 Units by mouth daily.    Marland Kitchen  CINNAMON PO Take 1 tablet by mouth daily.     Marland Kitchen DICLOFENAC PO Take 100 mg by mouth daily.     . Diclofenac Potassium 25 MG CAPS Take by mouth.    . Diclofenac Sodium CR 100 MG 24 hr tablet TK 1 T PO QD    . fish oil-omega-3 fatty acids 1000 MG capsule Take 1 g by mouth daily.     Marland Kitchen GELATIN PO Take 9 g by mouth daily.    . Glucosamine HCl (GLUCOSAMINE PO) Take 1 tablet by mouth daily.     . Misc Natural  Products (PROSTATE HEALTH) CAPS Take 1 capsule by mouth daily.    . nitroGLYCERIN (NITROSTAT) 0.4 MG SL tablet Place 1 tablet (0.4 mg total) under the tongue every 5 (five) minutes as needed for chest pain. 25 tablet 2  . omega-3 acid ethyl esters (LOVAZA) 1 g capsule Take by mouth.    Marland Kitchen omeprazole (PRILOSEC) 10 MG capsule Take 10 mg by mouth daily.    [name] (SYSTANE OP) Place 1 drop into both eyes daily as needed (dry eyes).    . predniSONE (STERAPRED UNI-PAK 48 TAB) 5 MG (48) TBPK tablet TK AS DIRECTED ON PACKAGE    . simvastatin (ZOCOR) 20 MG tablet TAKE 1 TABLET(20 MG) BY MOUTH AT BEDTIME 90 tablet 2  . tamsulosin (FLOMAX) 0.4 MG CAPS capsule Take 0.4 mg by mouth daily after supper.     . trandolapril (MAVIK) 2 MG tablet Take 1 tablet (2 mg total) by mouth daily. 90 tablet 2   No facility-administered medications prior to visit.      Allergies:   Codeine   Social History   Socioeconomic History  . Marital status: Married    Spouse name: Not on file  . Number of children: 2  . Years of education: Not on file  . Highest education level: Not on file  Occupational History  . Occupation: Retired  Scientific laboratory technician  . Financial resource strain: Not on file  . Food insecurity    Worry: Not on file    Inability: Not on file  . Transportation needs    Medical: Not on file    Non-medical: Not on file  Tobacco Use  . Smoking status: Former Smoker    Types: Cigarettes  . Smokeless tobacco: Former Systems developer  . Tobacco comment: Quit 1993  Substance and Sexual Activity  . Alcohol use: No  . Drug use: No  . Sexual activity: Not on file  Lifestyle  . Physical activity    Days per week: Not on file    Minutes per session: Not on file  . Stress: Not on file  Relationships  . Social Herbalist on phone: Not on file    Gets together: Not on file    Attends religious service: Not on file    Active member of club or organization: Not on file    Attends  meetings of clubs or organizations: Not on file    Relationship status: Not on file  Other Topics Concern  . Not on file  Social History Narrative   Lives at home with wife.            Family History:  The patient's family history includes CAD (age of onset: 79) in his father; Cancer (age of onset: 51) in his mother; Heart disease in his father.   ROS:   Please see the history of present illness.  ROS all other systems are reviewed and are negative  PHYSICAL EXAM:   VS:  BP 126/62 (BP Location: Left Arm, Patient Position: Sitting, Cuff Size: Normal)   Pulse (!) 55   Temp (!) 97.3 F (36.3 C)   Ht 5\' 10"  (1.778 m)   Wt 178 lb (80.7 kg)   BMI 25.54 kg/m     General: Alert, oriented x3, no distress, appears well and fit Head: no evidence of trauma, PERRL, EOMI, no exophtalmos or lid lag, no myxedema, no xanthelasma; normal ears, nose and oropharynx Neck: normal jugular venous pulsations and no hepatojugular reflux; brisk carotid pulses without delay and no carotid bruits Chest: clear to auscultation, no signs of consolidation by percussion or palpation, normal fremitus, symmetrical and full respiratory excursions Cardiovascular: normal position and quality of the apical impulse, regular rhythm, normal first and second heart sounds, no murmurs, rubs or gallops Abdomen: no tenderness or distention, no masses by palpation, no abnormal pulsatility or arterial bruits, normal bowel sounds, no hepatosplenomegaly Extremities: no clubbing, cyanosis or edema; 2+ radial, ulnar and brachial pulses bilaterally; 2+ right femoral, posterior tibial and dorsalis pedis pulses; 2+ left femoral, posterior tibial and dorsalis pedis pulses; no subclavian or femoral bruits Neurological: grossly nonfocal Psych: Normal mood and affect  Wt Readings from Last 3 Encounters:  01/27/19 178 lb (80.7 kg)  04/25/18 188 lb (85.3 kg)  04/23/18 188 lb (85.3 kg)    Studies/Labs Reviewed:   EKG:  EKG is ordered  today.  It shows sinus bradycardia with mild nonspecific T wave changes.  Normal QTC 419 ms [facility] from November 04, 2018 Hemoglobin 14.9, glucose 97, creatinine 1.0, potassium 4.8 Normal liver function tests Hemoglobin A1c 6.1% Total cholesterol 139, triglycerides 84, HDL 52, LDL 70  ASSESSMENT:    1. Coronary artery disease involving native coronary artery of native heart without angina pectoris   2. Nonsustained ventricular tachycardia ([place])   3. Hypercholesterolemia   4. Encounter for monitoring amiodarone therapy   5. Essential hypertension      PLAN:  In order of problems listed above:  1. CAD S/P CABG. asymptomatic despite being quite active for his age.  He is on aspirin and statin.  Intolerant to beta-blockers due to symptomatic bradycardia.  Graft dependent.  Preserved left ventricular systolic function. 2. NSVT: Associated with syncope several years ago.  Doing 3 years of loop recorder monitoring, there was no recurrence of ventricular tachycardia while on amiodarone 100 mg daily.  The loop recorder is no longer active.  He wore an event monitor in September that did not show any complex ventricular arrhythmia. 3. HLP: LDL at target of 70 or less.. We have previously discussed the interaction between amiodarone and simvastatin. The dose of simvastatin is low. He reports being intolerant of numerous other statins in the past and prefers not to switch to an alternative agent.  4. Amiodarone: Liver function tests and thyroid function test should be checked every 6 months.  These are performed at [facility]. 5. HTN: Well-controlled.    Medication Adjustments/Labs and Tests Ordered: Current medicines are reviewed at length with the patient today.  Concerns regarding medicines are outlined above.  Medication changes, Labs and Tests ordered today are listed in the Patient Instructions below. Patient Instructions  Medication  Instructions:  No changes *If you need a refill on your cardiac medications before your next appointment, please call your pharmacy*  Lab Work: None ordered If you have labs (blood work)  drawn today and your tests are completely normal, you will receive your results only by: Marland Kitchen MyChart Message (if you have MyChart) OR . A paper copy in the mail If you have any lab test that is abnormal or we need to change your treatment, we will call you to review the results.  Testing/Procedures: None ordered  Follow-Up: At [facility], you and your health needs are our priority.  As part of our continuing mission to provide you with exceptional heart care, we have created designated Provider Care Teams.  These Care Teams include your primary Cardiologist (physician) and Advanced Practice Providers (APPs -  Physician Assistants and Nurse Practitioners) who all work together to provide you with the care you need, when you need it.  Your next appointment:   12 months  The format for your next appointment:   In Person  Provider:   Sanda Klein, MD      Signed, Sanda Klein, MD  01/27/2019 1:05 PM    [facility] [address], [place], [place]  [id number] Phone: [phone]; Fax: [phone]

## 2019-02-10 ENCOUNTER — Other Ambulatory Visit: Payer: Self-pay

## 2019-02-10 ENCOUNTER — Ambulatory Visit (INDEPENDENT_AMBULATORY_CARE_PROVIDER_SITE_OTHER): Payer: Medicare Other | Admitting: Podiatry

## 2019-02-10 ENCOUNTER — Encounter: Payer: Self-pay | Admitting: Podiatry

## 2019-02-10 DIAGNOSIS — M79676 Pain in unspecified toe(s): Secondary | ICD-10-CM

## 2019-02-10 DIAGNOSIS — B351 Tinea unguium: Secondary | ICD-10-CM

## 2019-02-10 NOTE — Progress Notes (Signed)
Patient ID: Michael Bryant, male   DOB: 04-06-1933, 85 y.o.   MRN: [id number] Complaint:  Visit Type: Patient returns to my office for continued preventative foot care services. Complaint: Patient states" my nails have grown long and thick and become painful to walk and wear his shoes. . The patient presents for preventative foot care services. No changes to ROS  Podiatric Exam: Vascular: dorsalis pedis and posterior tibial pulses are palpable bilateral. Capillary return is immediate. Temperature gradient is WNL. Skin turgor WNL  Sensorium: Normal Semmes Weinstein monofilament test. Normal tactile sensation bilaterally. Nail Exam: Pt has thick disfigured discolored nails with subungual debris noted bilateral entire nail hallux through fifth toenails Ulcer Exam: There is no evidence of ulcer or pre-ulcerative changes or infection. Orthopedic Exam: Muscle tone and strength are WNL. No limitations in general ROM. No crepitus or effusions noted. Foot type and digits show no abnormalities. Bony prominences are unremarkable. Skin: No Porokeratosis. No infection or ulcers  Diagnosis:  Onychomycosis, , Pain in right toe, pain in left toes  Treatment & Plan Procedures and Treatment: Consent by patient was obtained for treatment procedures. The patient understood the discussion of treatment and procedures well. All questions were answered thoroughly reviewed. Debridement of mycotic and hypertrophic toenails, 1 through 5 bilateral and clearing of subungual debris. No ulceration, no infection noted.  Return Visit-Office Procedure: Patient instructed to return to the office for a follow up visit 3 months for continued evaluation and treatment.  Gardiner Barefoot DPM

## 2019-05-05 DIAGNOSIS — I10 Essential (primary) hypertension: Secondary | ICD-10-CM | POA: Diagnosis not present

## 2019-05-05 DIAGNOSIS — E119 Type 2 diabetes mellitus without complications: Secondary | ICD-10-CM | POA: Diagnosis not present

## 2019-05-05 DIAGNOSIS — E7800 Pure hypercholesterolemia, unspecified: Secondary | ICD-10-CM | POA: Diagnosis not present

## 2019-05-05 DIAGNOSIS — R972 Elevated prostate specific antigen [PSA]: Secondary | ICD-10-CM | POA: Diagnosis not present

## 2019-05-08 ENCOUNTER — Ambulatory Visit: Payer: Medicare Other | Attending: Internal Medicine

## 2019-05-08 DIAGNOSIS — Z23 Encounter for immunization: Secondary | ICD-10-CM | POA: Insufficient documentation

## 2019-05-08 NOTE — Progress Notes (Signed)
   Covid-19 Vaccination Clinic  Name:  Michael Bryant    MRN: [id number] DOB: 05-Mar-1934  05/08/2019  [name] was observed post Covid-19 immunization for 15 minutes without incidence. He was provided with Vaccine Information Sheet and instruction to access the V-Safe system.   [name] was instructed to call 911 with any severe reactions post vaccine: Marland Kitchen Difficulty breathing  . Swelling of your face and throat  . A fast heartbeat  . A bad rash all over your body  . Dizziness and weakness    Immunizations Administered    Name Date Dose VIS Date Route   Pfizer COVID-19 Vaccine 05/08/2019 10:02 AM 0.3 mL 02/20/2019 Intramuscular   Manufacturer: [place]   Lot: [id number]   [place]: [id number]

## 2019-05-11 ENCOUNTER — Other Ambulatory Visit: Payer: Self-pay | Admitting: Cardiovascular Disease

## 2019-05-11 NOTE — Telephone Encounter (Signed)
*  STAT* If patient is at the pharmacy, call can be transferred to refill team.   1. Which medications need to be refilled? (please list name of each medication and dose if known)  nitroGLYCERIN (NITROSTAT) 0.4 MG SL tablet  2. Which pharmacy/location (including street and city if local pharmacy) is medication to be sent to? [id number] - [place], [place] - [address]  3. Do they need a 30 day or 90 day supply? 90 day supply

## 2019-05-12 ENCOUNTER — Ambulatory Visit (INDEPENDENT_AMBULATORY_CARE_PROVIDER_SITE_OTHER): Payer: Medicare Other | Admitting: Podiatry

## 2019-05-12 ENCOUNTER — Encounter: Payer: Self-pay | Admitting: Podiatry

## 2019-05-12 ENCOUNTER — Other Ambulatory Visit: Payer: Self-pay

## 2019-05-12 VITALS — Temp 97.3°F

## 2019-05-12 DIAGNOSIS — B351 Tinea unguium: Secondary | ICD-10-CM | POA: Diagnosis not present

## 2019-05-12 DIAGNOSIS — M79676 Pain in unspecified toe(s): Secondary | ICD-10-CM | POA: Diagnosis not present

## 2019-05-12 MED ORDER — NITROGLYCERIN 0.4 MG SL SUBL: 0.4000 mg | SUBLINGUAL_TABLET | SUBLINGUAL | 4 refills | Status: DC | PRN

## 2019-05-12 NOTE — Progress Notes (Signed)
Patient ID: Michael Bryant, male   DOB: Jun 09, 1933, 86 y.o.   MRN: [id number] Complaint:  Visit Type: Patient returns to my office for continued preventative foot care services. Complaint: Patient states" my nails have grown long and thick and become painful to walk and wear his shoes. . The patient presents for preventative foot care services. No changes to ROS  Podiatric Exam: Vascular: dorsalis pedis and posterior tibial pulses are palpable bilateral. Capillary return is immediate. Temperature gradient is WNL. Skin turgor WNL  Sensorium: Normal Semmes Weinstein monofilament test. Normal tactile sensation bilaterally. Nail Exam: Pt has thick disfigured discolored nails with subungual debris noted bilateral entire nail hallux through fifth toenails Ulcer Exam: There is no evidence of ulcer or pre-ulcerative changes or infection. Orthopedic Exam: Muscle tone and strength are WNL. No limitations in general ROM. No crepitus or effusions noted. Foot type and digits show no abnormalities. Bony prominences are unremarkable. Skin: No Porokeratosis. No infection or ulcers  Diagnosis:  Onychomycosis, , Pain in right toe, pain in left toes  Treatment & Plan Procedures and Treatment: Consent by patient was obtained for treatment procedures. The patient understood the discussion of treatment and procedures well. All questions were answered thoroughly reviewed. Debridement of mycotic and hypertrophic toenails, 1 through 5 bilateral and clearing of subungual debris. No ulceration, no infection noted.  Return Visit-Office Procedure: Patient instructed to return to the office for a follow up visit 3 months for continued evaluation and treatment.  Gardiner Barefoot DPM

## 2019-06-02 ENCOUNTER — Ambulatory Visit: Payer: Medicare Other | Attending: Internal Medicine

## 2019-06-02 DIAGNOSIS — Z23 Encounter for immunization: Secondary | ICD-10-CM

## 2019-06-02 NOTE — Progress Notes (Signed)
   Covid-19 Vaccination Clinic  Name:  ERICKSON KOCIAN    MRN: [id number] DOB: Oct 06, 1933  06/02/2019  Mr. Streich was observed post Covid-19 immunization for 15 minutes without incident. He was provided with Vaccine Information Sheet and instruction to access the V-Safe system.   Mr. Olt was instructed to call 911 with any severe reactions post vaccine: Marland Kitchen Difficulty breathing  . Swelling of face and throat  . A fast heartbeat  . A bad rash all over body  . Dizziness and weakness   Immunizations Administered    Name Date Dose VIS Date Route   Pfizer COVID-19 Vaccine 06/02/2019 11:52 AM 0.3 mL 02/20/2019 Intramuscular   Manufacturer: [place]   Lot: [id number]   [place]: [id number]

## 2019-06-04 DIAGNOSIS — I2510 Atherosclerotic heart disease of native coronary artery without angina pectoris: Secondary | ICD-10-CM | POA: Diagnosis not present

## 2019-06-04 DIAGNOSIS — E7800 Pure hypercholesterolemia, unspecified: Secondary | ICD-10-CM | POA: Diagnosis not present

## 2019-06-04 DIAGNOSIS — I10 Essential (primary) hypertension: Secondary | ICD-10-CM | POA: Diagnosis not present

## 2019-06-04 DIAGNOSIS — E119 Type 2 diabetes mellitus without complications: Secondary | ICD-10-CM | POA: Diagnosis not present

## 2019-08-12 ENCOUNTER — Ambulatory Visit (INDEPENDENT_AMBULATORY_CARE_PROVIDER_SITE_OTHER): Payer: Medicare Other | Admitting: Podiatry

## 2019-08-12 ENCOUNTER — Other Ambulatory Visit: Payer: Self-pay

## 2019-08-12 ENCOUNTER — Encounter: Payer: Self-pay | Admitting: Podiatry

## 2019-08-12 VITALS — Temp 97.7°F

## 2019-08-12 DIAGNOSIS — M79676 Pain in unspecified toe(s): Secondary | ICD-10-CM

## 2019-08-12 DIAGNOSIS — B351 Tinea unguium: Secondary | ICD-10-CM | POA: Diagnosis not present

## 2019-08-12 NOTE — Progress Notes (Signed)
This patient returns to the office for evaluation and treatment of long thick painful nails .  This patient is unable to trim his own nails since the patient cannot reach his feet.  Patient says the nails are painful walking and wearing his shoes.  He returns for preventive foot care services.  General Appearance  Alert, conversant and in no acute stress.  Vascular  Dorsalis pedis and posterior tibial  pulses are palpable  bilaterally.  Capillary return is within normal limits  bilaterally. Temperature is within normal limits  bilaterally.  Neurologic  Senn-Weinstein monofilament wire test within normal limits  bilaterally. Muscle power within normal limits bilaterally.  Nails Thick disfigured discolored nails with subungual debris  from hallux to fifth toes bilaterally. No evidence of bacterial infection or drainage bilaterally.  Orthopedic  No limitations of motion  feet .  No crepitus or effusions noted.  No bony pathology or digital deformities noted.  Skin  normotropic skin with no porokeratosis noted bilaterally.  No signs of infections or ulcers noted.     Onychomycosis  Pain in toes right foot  Pain in toes left foot  Debridement  of nails  1-5  B/L with a nail nipper.  Nails were then filed using a dremel tool with no incidents.    RTC  3 months    Vaishnav Demartin DPM  

## 2019-09-07 ENCOUNTER — Other Ambulatory Visit: Payer: Self-pay

## 2019-09-07 MED ORDER — SIMVASTATIN 20 MG PO TABS: ORAL_TABLET | ORAL | 2 refills | Status: DC

## 2019-09-07 MED ORDER — TRANDOLAPRIL 2 MG PO TABS: 2.0000 mg | ORAL_TABLET | Freq: Every day | ORAL | 2 refills | Status: DC

## 2019-09-07 MED ORDER — AMIODARONE HCL 200 MG PO TABS: 100.0000 mg | ORAL_TABLET | Freq: Every day | ORAL | 2 refills | Status: DC

## 2019-10-06 DIAGNOSIS — D044 Carcinoma in situ of skin of scalp and neck: Secondary | ICD-10-CM | POA: Diagnosis not present

## 2019-10-06 DIAGNOSIS — L821 Other seborrheic keratosis: Secondary | ICD-10-CM | POA: Diagnosis not present

## 2019-10-06 DIAGNOSIS — Z85828 Personal history of other malignant neoplasm of skin: Secondary | ICD-10-CM | POA: Diagnosis not present

## 2019-10-06 DIAGNOSIS — D485 Neoplasm of uncertain behavior of skin: Secondary | ICD-10-CM | POA: Diagnosis not present

## 2019-11-12 DIAGNOSIS — E7800 Pure hypercholesterolemia, unspecified: Secondary | ICD-10-CM | POA: Diagnosis not present

## 2019-11-12 DIAGNOSIS — I10 Essential (primary) hypertension: Secondary | ICD-10-CM | POA: Diagnosis not present

## 2019-11-12 DIAGNOSIS — Z125 Encounter for screening for malignant neoplasm of prostate: Secondary | ICD-10-CM | POA: Diagnosis not present

## 2019-11-18 ENCOUNTER — Ambulatory Visit (INDEPENDENT_AMBULATORY_CARE_PROVIDER_SITE_OTHER): Payer: Medicare Other | Admitting: Podiatry

## 2019-11-18 ENCOUNTER — Encounter: Payer: Self-pay | Admitting: Podiatry

## 2019-11-18 ENCOUNTER — Other Ambulatory Visit: Payer: Self-pay

## 2019-11-18 DIAGNOSIS — B351 Tinea unguium: Secondary | ICD-10-CM

## 2019-11-18 DIAGNOSIS — M79676 Pain in unspecified toe(s): Secondary | ICD-10-CM

## 2019-11-18 NOTE — Progress Notes (Signed)
This patient returns to the office for evaluation and treatment of long thick painful nails .  This patient is unable to trim his own nails since the patient cannot reach his feet.  Patient says the nails are painful walking and wearing his shoes.  He returns for preventive foot care services.  General Appearance  Alert, conversant and in no acute stress.  Vascular  Dorsalis pedis and posterior tibial  pulses are palpable  bilaterally.  Capillary return is within normal limits  bilaterally. Temperature is within normal limits  bilaterally.  Neurologic  Senn-Weinstein monofilament wire test within normal limits  bilaterally. Muscle power within normal limits bilaterally.  Nails Thick disfigured discolored nails with subungual debris  from hallux to fifth toes bilaterally. No evidence of bacterial infection or drainage bilaterally.  Orthopedic  No limitations of motion  feet .  No crepitus or effusions noted.  No bony pathology or digital deformities noted.  Skin  normotropic skin with no porokeratosis noted bilaterally.  No signs of infections or ulcers noted.     Onychomycosis  Pain in toes right foot  Pain in toes left foot  Debridement  of nails  1-5  B/L with a nail nipper.  Nails were then filed using a dremel tool with no incidents.    RTC  3 months    Norita Meigs DPM  

## 2019-12-03 DIAGNOSIS — E7800 Pure hypercholesterolemia, unspecified: Secondary | ICD-10-CM | POA: Diagnosis not present

## 2019-12-03 DIAGNOSIS — J4521 Mild intermittent asthma with (acute) exacerbation: Secondary | ICD-10-CM | POA: Diagnosis not present

## 2019-12-03 DIAGNOSIS — I10 Essential (primary) hypertension: Secondary | ICD-10-CM | POA: Diagnosis not present

## 2019-12-03 DIAGNOSIS — Z125 Encounter for screening for malignant neoplasm of prostate: Secondary | ICD-10-CM | POA: Diagnosis not present

## 2019-12-03 DIAGNOSIS — Z0000 Encounter for general adult medical examination without abnormal findings: Secondary | ICD-10-CM | POA: Diagnosis not present

## 2019-12-03 DIAGNOSIS — E119 Type 2 diabetes mellitus without complications: Secondary | ICD-10-CM | POA: Diagnosis not present

## 2019-12-03 DIAGNOSIS — I2510 Atherosclerotic heart disease of native coronary artery without angina pectoris: Secondary | ICD-10-CM | POA: Diagnosis not present

## 2019-12-03 DIAGNOSIS — R972 Elevated prostate specific antigen [PSA]: Secondary | ICD-10-CM | POA: Diagnosis not present

## 2019-12-08 DIAGNOSIS — H353122 Nonexudative age-related macular degeneration, left eye, intermediate dry stage: Secondary | ICD-10-CM | POA: Diagnosis not present

## 2020-01-25 DIAGNOSIS — Z23 Encounter for immunization: Secondary | ICD-10-CM | POA: Diagnosis not present

## 2020-02-11 ENCOUNTER — Ambulatory Visit (INDEPENDENT_AMBULATORY_CARE_PROVIDER_SITE_OTHER): Payer: Medicare Other | Admitting: Cardiovascular Disease

## 2020-02-11 ENCOUNTER — Encounter: Payer: Self-pay | Admitting: Cardiovascular Disease

## 2020-02-11 ENCOUNTER — Other Ambulatory Visit: Payer: Self-pay

## 2020-02-11 VITALS — BP 136/67 | HR 62 | Ht 70.0 in | Wt 183.0 lb

## 2020-02-11 DIAGNOSIS — I2510 Atherosclerotic heart disease of native coronary artery without angina pectoris: Secondary | ICD-10-CM

## 2020-02-11 DIAGNOSIS — I4729 Other ventricular tachycardia: Secondary | ICD-10-CM

## 2020-02-11 DIAGNOSIS — E7800 Pure hypercholesterolemia, unspecified: Secondary | ICD-10-CM

## 2020-02-11 DIAGNOSIS — Z79899 Other long term (current) drug therapy: Secondary | ICD-10-CM | POA: Diagnosis not present

## 2020-02-11 DIAGNOSIS — I472 Ventricular tachycardia: Secondary | ICD-10-CM

## 2020-02-11 DIAGNOSIS — Z5181 Encounter for therapeutic drug level monitoring: Secondary | ICD-10-CM

## 2020-02-11 DIAGNOSIS — I10 Essential (primary) hypertension: Secondary | ICD-10-CM | POA: Diagnosis not present

## 2020-02-11 NOTE — Progress Notes (Signed)
Cardiology Office Note    Date:  02/13/2020   ID:  Michael Bryant, DOB 02/22/1934, MRN [id number]  PCP:  [name], MD  Cardiologist:   Sanda Klein, MD   Chief Complaint  Patient presents with  . Irregular Heart Beat  . Coronary Artery Disease    History of Present Illness:  Michael Bryant is a 86 y.o. male with a remote history of coronary artery disease presenting as cardiac arrest in 2003 and leading to bypass surgery, near-syncope related to nonsustained ventricular tachycardia, no recurrence on Amiodarone, normal left ventricular systolic function by echo (last checked June 2017).  The patient specifically denies any chest pain at rest exertion, dyspnea at rest or with exertion, orthopnea, paroxysmal nocturnal dyspnea, syncope, palpitations, focal neurological deficits, intermittent claudication, lower extremity edema, unexplained weight gain, cough, hemoptysis or wheezing. Works in garden, sometimes pretty heavy exertion. Had recent labs w Vassie Moment, NP at [facility].  He was initially seen for emergency CABG 12/16/2001 with a witnessed VF arrest , significant LAD disease with intraluminal thrombus. He underwent LIMA-LAD, VG-2 OM 1, VG-distal RCA.  In 03/2013 he underwent a stress Myoview which was negative for ischemia; EF 60%. In 06/2013 he had syncope following sexual intercourse. When 911 emergency services arrived was found to have extremely frequent PVCs and NSVT on the monitor.  He underwent cardiac catheterization on 07/02/13 which revealed: "Severe native coronary artery disease, graft dependent. Widely patent bypass grafts. Normal left ventricular systolic function. Beta blockers have been avoided due to bradycardia, but he tolerates low-dose amiodarone. Has an ILR, but the device reached RRT on 11/20/2016. Event monitor for palpitations in September 2019 showed rare PACs and rare PVCs, normal rhythm during subjective "racing heartbeat".  Past Medical History:    Diagnosis Date  . CAD (coronary artery disease)    a. s/p CABG (2003)  . Cardiac arrest ([place]) 2003  . Hyperlipidemia   . NSVT (nonsustained ventricular tachycardia) ([place]) 06/16/2013    Past Surgical History:  Procedure Laterality Date  . CARDIAC CATHETERIZATION  12/15/2001   60% proximal LAD narrowing with active mobile thrombosis w/50% sequential areas,50-60% pro  . CORONARY ARTERY BYPASS GRAFT  2004   LIMA to LAD, SVG to OM, SVG to RCA  . LEFT HEART CATHETERIZATION WITH CORONARY/GRAFT ANGIOGRAM N/A 07/02/2013   Procedure: LEFT HEART CATHETERIZATION WITH Beatrix Fetters;  Surgeon: Sanda Klein, MD;  Location: [facility] CATH [facility];  Service: Cardiovascular;  Laterality: N/A;  . NM MYOCAR PERF WALL MOTION  12/09/2009   mod. perfusion defect basal inferior and mid inferior regions c/w  infarct/scar, EF 54%  . TONSILLECTOMY AND ADENOIDECTOMY      Current Medications: Outpatient Medications Prior to Visit  Medication Sig Dispense Refill  . amiodarone (PACERONE) 200 MG tablet Take 0.5 tablets (100 mg total) by mouth daily. 45 tablet 2  . aspirin EC 81 MG tablet Take 81 mg by mouth daily.    . beta carotene w/minerals (OCUVITE) tablet Take 1 tablet by mouth daily.    . chlorpheniramine (EQ CHLORTABS) 4 MG tablet Take 4 mg by mouth daily as needed (allergies).     . Cholecalciferol (VITAMIN D) 50 MCG (2000 UT) CAPS Take 2,000 Units by mouth daily.    Marland Kitchen CINNAMON PO Take 1 tablet by mouth daily.     Marland Kitchen DICLOFENAC PO Take 100 mg by mouth daily.     . Diclofenac Potassium 25 MG CAPS Take by mouth.    . Diclofenac Sodium CR  100 MG 24 hr tablet TK 1 T PO QD    . fish oil-omega-3 fatty acids 1000 MG capsule Take 1 g by mouth daily.     Marland Kitchen GELATIN PO Take 9 g by mouth daily.    . Glucosamine HCl (GLUCOSAMINE PO) Take 1 tablet by mouth daily.     . Misc Natural Products (PROSTATE HEALTH) CAPS Take 1 capsule by mouth daily.    . nitroGLYCERIN (NITROSTAT) 0.4 MG SL tablet Place 1 tablet (0.4 mg  total) under the tongue every 5 (five) minutes as needed for chest pain. 25 tablet 4  . omega-3 acid ethyl esters (LOVAZA) 1 g capsule Take by mouth.    Marland Kitchen omeprazole (PRILOSEC) 10 MG capsule Take 10 mg by mouth daily.    [name] (SYSTANE OP) Place 1 drop into both eyes daily as needed (dry eyes).    . predniSONE (STERAPRED UNI-PAK 48 TAB) 5 MG (48) TBPK tablet TK AS DIRECTED ON PACKAGE    . simvastatin (ZOCOR) 20 MG tablet TAKE 1 TABLET(20 MG) BY MOUTH AT BEDTIME 90 tablet 2  . tamsulosin (FLOMAX) 0.4 MG CAPS capsule Take 0.4 mg by mouth daily after supper.     . trandolapril (MAVIK) 2 MG tablet Take 1 tablet (2 mg total) by mouth daily. 90 tablet 2   No facility-administered medications prior to visit.     Allergies:   Codeine   Social History   Socioeconomic History  . Marital status: Married    Spouse name: Not on file  . Number of children: 2  . Years of education: Not on file  . Highest education level: Not on file  Occupational History  . Occupation: Retired  Tobacco Use  . Smoking status: Former Smoker    Types: Cigarettes  . Smokeless tobacco: Former Systems developer  . Tobacco comment: Quit 1993  Vaping Use  . Vaping Use: Never used  Substance and Sexual Activity  . Alcohol use: No  . Drug use: No  . Sexual activity: Not on file  Other Topics Concern  . Not on file  Social History Narrative   Lives at home with wife.          Social Determinants of Health   Financial Resource Strain:   . Difficulty of Paying Living Expenses: Not on file  Food Insecurity:   . Worried About Charity fundraiser in the Last Year: Not on file  . Ran Out of Food in the Last Year: Not on file  Transportation Needs:   . Lack of Transportation (Medical): Not on file  . Lack of Transportation (Non-Medical): Not on file  Physical Activity:   . Days of Exercise per Week: Not on file  . Minutes of Exercise per Session: Not on file  Stress:   . Feeling of Stress : Not  on file  Social Connections:   . Frequency of Communication with Friends and Family: Not on file  . Frequency of Social Gatherings with Friends and Family: Not on file  . Attends Religious Services: Not on file  . Active Member of Clubs or Organizations: Not on file  . Attends Archivist Meetings: Not on file  . Marital Status: Not on file     Family History:  The patient's family history includes CAD (age of onset: 59) in his father; Cancer (age of onset: 37) in his mother; Heart disease in his father.   ROS:   Please see the history of present  illness.    ROS All other systems are reviewed and are negative.   PHYSICAL EXAM:   VS:  BP 136/67 (BP Location: Left Arm, Patient Position: Sitting, Cuff Size: Normal)   Pulse 62   Ht 5\' 10"  (1.778 m)   Wt 183 lb (83 kg)   BMI 26.26 kg/m      General: Alert, oriented x3, no distress, minimally overweight Head: no evidence of trauma, PERRL, EOMI, no exophtalmos or lid lag, no myxedema, no xanthelasma; normal ears, nose and oropharynx Neck: normal jugular venous pulsations and no hepatojugular reflux; brisk carotid pulses without delay and no carotid bruits Chest: clear to auscultation, no signs of consolidation by percussion or palpation, normal fremitus, symmetrical and full respiratory excursions Cardiovascular: normal position and quality of the apical impulse, regular rhythm, normal first and second heart sounds, no murmurs, rubs or gallops Abdomen: no tenderness or distention, no masses by palpation, no abnormal pulsatility or arterial bruits, normal bowel sounds, no hepatosplenomegaly Extremities: no clubbing, cyanosis or edema; 2+ radial, ulnar and brachial pulses bilaterally; 2+ right femoral, posterior tibial and dorsalis pedis pulses; 2+ left femoral, posterior tibial and dorsalis pedis pulses; no subclavian or femoral bruits Neurological: grossly nonfocal Psych: Normal mood and affect   Wt Readings from Last 3  Encounters:  02/11/20 183 lb (83 kg)  01/27/19 178 lb (80.7 kg)  04/25/18 188 lb (85.3 kg)    Studies/Labs Reviewed:   EKG:  EKG is ordered today.  It shows sinus bradycardia with mild nonspecific T wave changes.  Normal QTC 419 ms [facility] from November 04, 2018 Hemoglobin 14.9, glucose 97, creatinine 1.0, potassium 4.8 Normal liver function tests Hemoglobin A1c 6.1% Total cholesterol 139, triglycerides 84, HDL 52, LDL 70  ASSESSMENT:    1. Coronary artery disease involving native coronary artery of native heart without angina pectoris   2. NSVT (nonsustained ventricular tachycardia) ([place])   3. Hypercholesterolemia   4. Encounter for monitoring amiodarone therapy   5. Essential hypertension      PLAN:  In order of problems listed above:  1. CAD S/P CABG. No angina, quite active.  He is on aspirin and statin.  Intolerant to beta-blockers due to symptomatic bradycardia.  Graft dependent.  Preserved left ventricular systolic function. 2. NSVT: Associated with syncope several years ago.  Doing 3 years of loop recorder monitoring, there was no recurrence of ventricular tachycardia while on amiodarone 100 mg daily.  The loop recorder is no longer active.  He wore an event monitor in September 2020 that did not show any complex ventricular arrhythmia. 3. HLP: LDL target <70. Will request labs from [facility]. We have previously discussed the interaction between amiodarone and simvastatin. The dose of simvastatin is low. He reports being intolerant of numerous other statins in the past and prefers not to switch to an alternative agent.  4. Amiodarone: No side effects to date. Liver function tests and thyroid function test should be checked every 6 months. Needs yearly eye exam. Report unexplained respiratory symptoms. 5. HTN: Well-controlled.    Medication Adjustments/Labs and Tests Ordered: Current medicines are reviewed at length with the patient today.  Concerns  regarding medicines are outlined above.  Medication changes, Labs and Tests ordered today are listed in the Patient Instructions below. Patient Instructions  Medication Instructions:  No changes *If you need a refill on your cardiac medications before your next appointment, please call your pharmacy*   Lab Work: None ordered If you have labs (blood  work) drawn today and your tests are completely normal, you will receive your results only by: Marland Kitchen MyChart Message (if you have MyChart) OR . A paper copy in the mail If you have any lab test that is abnormal or we need to change your treatment, we will call you to review the results.   Testing/Procedures: None ordered   Follow-Up: At [facility], you and your health needs are our priority.  As part of our continuing mission to provide you with exceptional heart care, we have created designated Provider Care Teams.  These Care Teams include your primary Cardiologist (physician) and Advanced Practice Providers (APPs -  Physician Assistants and Nurse Practitioners) who all work together to provide you with the care you need, when you need it.  We recommend signing up for the patient portal called "MyChart".  Sign up information is provided on this After Visit Summary.  MyChart is used to connect with patients for Virtual Visits (Telemedicine).  Patients are able to view lab/test results, encounter notes, upcoming appointments, etc.  Non-urgent messages can be sent to your provider as well.   To learn more about what you can do with MyChart, go to [email or web address].    Your next appointment:   12 month(s)  The format for your next appointment:   In Person  Provider:   You may see Sanda Klein, MD or one of the following Advanced Practice Providers on your designated Care Team:    Almyra Deforest, PA-C  Fabian Sharp, [place] or   Roby Lofts, PA-C      Signed, Sanda Klein, MD  02/13/2020 11:00 AM    [address], [place], [place]  [address] Phone: [phone]; Fax: [phone]

## 2020-02-11 NOTE — Patient Instructions (Signed)

## 2020-02-13 ENCOUNTER — Encounter: Payer: Self-pay | Admitting: Cardiovascular Disease

## 2020-02-17 ENCOUNTER — Other Ambulatory Visit: Payer: Self-pay

## 2020-02-17 ENCOUNTER — Ambulatory Visit (INDEPENDENT_AMBULATORY_CARE_PROVIDER_SITE_OTHER): Payer: Medicare Other | Admitting: Podiatry

## 2020-02-17 ENCOUNTER — Encounter: Payer: Self-pay | Admitting: Podiatry

## 2020-02-17 DIAGNOSIS — M79676 Pain in unspecified toe(s): Secondary | ICD-10-CM | POA: Diagnosis not present

## 2020-02-17 DIAGNOSIS — B351 Tinea unguium: Secondary | ICD-10-CM | POA: Diagnosis not present

## 2020-02-17 DIAGNOSIS — Z85828 Personal history of other malignant neoplasm of skin: Secondary | ICD-10-CM | POA: Diagnosis not present

## 2020-02-17 DIAGNOSIS — L820 Inflamed seborrheic keratosis: Secondary | ICD-10-CM | POA: Diagnosis not present

## 2020-02-17 NOTE — Progress Notes (Signed)
This patient returns to the office for evaluation and treatment of long thick painful nails .  This patient is unable to trim his own nails since the patient cannot reach his feet.  Patient says the nails are painful walking and wearing his shoes.  He returns for preventive foot care services.  General Appearance  Alert, conversant and in no acute stress.  Vascular  Dorsalis pedis and posterior tibial  pulses are palpable  bilaterally.  Capillary return is within normal limits  bilaterally. Temperature is within normal limits  bilaterally.  Neurologic  Senn-Weinstein monofilament wire test within normal limits  bilaterally. Muscle power within normal limits bilaterally.  Nails Thick disfigured discolored nails with subungual debris  from hallux to fifth toes bilaterally. No evidence of bacterial infection or drainage bilaterally.  Orthopedic  No limitations of motion  feet .  No crepitus or effusions noted.  No bony pathology or digital deformities noted.  Skin  normotropic skin with no porokeratosis noted bilaterally.  No signs of infections or ulcers noted.     Onychomycosis  Pain in toes right foot  Pain in toes left foot  Debridement  of nails  1-5  B/L with a nail nipper.  Nails were then filed using a dremel tool with no incidents.    RTC  3 months    Shona Pardo DPM  

## 2020-05-18 ENCOUNTER — Ambulatory Visit: Payer: Medicare Other | Admitting: Podiatry

## 2020-06-03 ENCOUNTER — Other Ambulatory Visit: Payer: Self-pay | Admitting: Cardiovascular Disease

## 2020-09-25 ENCOUNTER — Emergency Department (HOSPITAL_COMMUNITY): Payer: Medicare Other

## 2020-09-25 ENCOUNTER — Observation Stay (HOSPITAL_COMMUNITY)
Admission: EM | Admit: 2020-09-25 | Discharge: 2020-09-25 | Disposition: A | Discharge status: Home / Self Care | Payer: Medicare Other | Attending: Internal Medicine | Admitting: Internal Medicine

## 2020-09-25 ENCOUNTER — Encounter (HOSPITAL_COMMUNITY): Payer: Self-pay | Admitting: Emergency Medicine

## 2020-09-25 ENCOUNTER — Other Ambulatory Visit: Payer: Self-pay

## 2020-09-25 DIAGNOSIS — Z7982 Long term (current) use of aspirin: Secondary | ICD-10-CM | POA: Insufficient documentation

## 2020-09-25 DIAGNOSIS — Z79899 Other long term (current) drug therapy: Secondary | ICD-10-CM | POA: Diagnosis not present

## 2020-09-25 DIAGNOSIS — Z87891 Personal history of nicotine dependence: Secondary | ICD-10-CM | POA: Diagnosis not present

## 2020-09-25 DIAGNOSIS — Z951 Presence of aortocoronary bypass graft: Secondary | ICD-10-CM | POA: Insufficient documentation

## 2020-09-25 DIAGNOSIS — I10 Essential (primary) hypertension: Secondary | ICD-10-CM | POA: Insufficient documentation

## 2020-09-25 DIAGNOSIS — R002 Palpitations: Secondary | ICD-10-CM | POA: Diagnosis not present

## 2020-09-25 DIAGNOSIS — I2510 Atherosclerotic heart disease of native coronary artery without angina pectoris: Secondary | ICD-10-CM | POA: Diagnosis not present

## 2020-09-25 DIAGNOSIS — Z20822 Contact with and (suspected) exposure to covid-19: Secondary | ICD-10-CM | POA: Diagnosis not present

## 2020-09-25 DIAGNOSIS — R072 Precordial pain: Secondary | ICD-10-CM | POA: Diagnosis not present

## 2020-09-25 DIAGNOSIS — R079 Chest pain, unspecified: Secondary | ICD-10-CM | POA: Diagnosis present

## 2020-09-25 HISTORY — DX: Essential (primary) hypertension: I10

## 2020-09-25 LAB — BASIC METABOLIC PANEL
Anion gap: 3 — ABNORMAL LOW (ref 5–15)
BUN: 23 mg/dL (ref 8–23)
CO2: 32 mmol/L (ref 22–32)
Calcium: 9 mg/dL (ref 8.9–10.3)
Chloride: 106 mmol/L (ref 98–111)
Creatinine, Ser: 0.89 mg/dL (ref 0.61–1.24)
GFR, Estimated: >60 mL/min (ref >60)
Glucose, Bld: 107 mg/dL — ABNORMAL HIGH (ref 70–99)
Potassium: 4 mmol/L (ref 3.5–5.1)
Sodium: 141 mmol/L (ref 135–145)

## 2020-09-25 LAB — CBC
HCT: 44.1 % (ref 39.0–52.0)
Hemoglobin: 14.4 g/dL (ref 13.0–17.0)
MCH: 31.2 pg (ref 26.0–34.0)
MCHC: 32.7 g/dL (ref 30.0–36.0)
MCV: 95.5 fL (ref 80.0–100.0)
Platelets: 205 10*3/uL (ref 150–400)
RBC: 4.62 MIL/uL (ref 4.22–5.81)
RDW: 13.8 % (ref 11.5–15.5)
WBC: 6.8 10*3/uL (ref 4.0–10.5)
nRBC: 0 % (ref 0.0–0.2)

## 2020-09-25 LAB — RESP PANEL BY RT-PCR (FLU A&B, COVID) ARPGX2
Influenza A by PCR: NEGATIVE
Influenza B by PCR: NEGATIVE
SARS Coronavirus 2 by RT PCR: NEGATIVE

## 2020-09-25 LAB — TROPONIN I (HIGH SENSITIVITY)
Troponin I (High Sensitivity): 9 ng/L (ref <18)
Troponin I (High Sensitivity): 8 ng/L (ref <18)

## 2020-09-25 MED ORDER — ASPIRIN 81 MG PO CHEW
324.0000 mg | CHEWABLE_TABLET | Freq: Once | ORAL | Status: DC
  Filled 2020-09-25: qty 4

## 2020-09-25 NOTE — Discharge Instructions (Addendum)
Follow up with Cardiology.  Return for new or worsening symptoms 

## 2020-09-25 NOTE — ED Notes (Signed)
Xray to bring patient when finished with him

## 2020-09-25 NOTE — ED Provider Notes (Signed)
[facility] EMERGENCY DEPARTMENT Provider Note   CSN: [id number] Arrival date & time: 09/25/20  0551    History Chief Complaint  Patient presents with   Chest Pain    Michael Bryant is a 87 y.o. male with history significant for CAD, V. fib arrest, hyperlipidemia, MI who presents for evaluation of chest discomfort.  Woke up this morning with sudden onset "thumping and pressure" had associated tingling to left arm. Felt hot and mild nausea without emesis. No diaphoresis. Has had something previously. He is unsure if this feels similar to prior MI as he states he had no pain with prior cardiac events. No back pain. No syncope. No HA, neck pain, jaw pain, abd pain, LE pain, swelling. No pain currently. Took ASA 324 PTA. Known bradycardia. No current pain. Denies additional aggrieving or alleviating factors.  History obtained from patient and past medical records. No interpretor was used.  Cards>> Croitoru, Mihai, MD  HPI  HPI: A 87 year old patient with a history of CVA, hypertension, hypercholesterolemia and obesity presents for evaluation of chest pain. Initial onset of pain was less than one hour ago. The patient's chest pain is described as heaviness/pressure/tightness and is not worse with exertion. The patient complains of nausea. The patient's chest pain is middle- or left-sided, is not well-localized, is not sharp and does radiate to the arms/jaw/neck. The patient denies diaphoresis. The patient has a family history of coronary artery disease in a first-degree relative with onset less than age 78. The patient has no history of peripheral artery disease, has not smoked in the past 90 days and denies any history of treated diabetes.   Past Medical History:  Diagnosis Date   CAD (coronary artery disease)    VF arrest >>  s/p CABG (2003) // Myoview 1/15: no ischemia // Cath 4/15: all grafts patent   HTN (hypertension)    Hx of VF arrest in 2003 2003   Hyperlipidemia     NSVT (nonsustained ventricular tachycardia) ([place]) 06/16/2013   Rx with amiodarone // Echo 6/17: EF 55-60, Gr 1 DD, apical HK    Patient Active Problem List   Diagnosis Date Noted   Chest pain of uncertain etiology 96/75/9163   Syncope 07/02/2013   History of ventricular fibrillation 07/02/2013   NSVT (nonsustained ventricular tachycardia) ([place]) 06/16/2013   Myocardial infarction, old 06/16/2013   Essential hypertension 03/25/2013   CAD (coronary artery disease), hx CABG 2003 03/24/2013   Arm pain 03/24/2013    Past Surgical History:  Procedure Laterality Date   CARDIAC CATHETERIZATION  12/15/2001   60% proximal LAD narrowing with active mobile thrombosis w/50% sequential areas,50-60% pro   CORONARY ARTERY BYPASS GRAFT  2004   LIMA to LAD, SVG to OM, SVG to RCA   LEFT HEART CATHETERIZATION WITH CORONARY/GRAFT ANGIOGRAM N/A 07/02/2013   Procedure: LEFT HEART CATHETERIZATION WITH Beatrix Fetters;  Surgeon: Sanda Klein, MD;  Location: [facility] CATH LAB;  Service: Cardiovascular;  Laterality: N/A;   NM MYOCAR PERF WALL MOTION  12/09/2009   mod. perfusion defect basal inferior and mid inferior regions c/w  infarct/scar, EF 54%   TONSILLECTOMY AND ADENOIDECTOMY         Family History  Problem Relation Age of Onset   Cancer Mother 58   CAD Father 53       Died age 79   Heart disease Father     Social History   Tobacco Use   Smoking status: Former    Types: Cigarettes  Smokeless tobacco: Former   Tobacco comments:    Quit 1993  Vaping Use   Vaping Use: Never used  Substance Use Topics   Alcohol use: No   Drug use: No    Home Medications Prior to Admission medications   Medication Sig Start Date End Date Taking? Authorizing Provider  amiodarone (PACERONE) 200 MG tablet TAKE 1/2 TABLET(100 MG) BY MOUTH DAILY 06/03/20   Croitoru, Mihai, MD  aspirin EC 81 MG tablet Take 81 mg by mouth daily.    [provider]  beta carotene w/minerals (OCUVITE) tablet Take 1  tablet by mouth daily.    [provider]  chlorpheniramine (EQ CHLORTABS) 4 MG tablet Take 4 mg by mouth daily as needed (allergies).     [provider]  Cholecalciferol (VITAMIN D) 50 MCG (2000 UT) CAPS Take 2,000 Units by mouth daily.    [provider]  CINNAMON PO Take 1 tablet by mouth daily.     [provider]  DICLOFENAC PO Take 100 mg by mouth daily.     [provider]  Diclofenac Potassium 25 MG CAPS Take by mouth.    [provider]  Diclofenac Sodium CR 100 MG 24 hr tablet TK 1 T PO QD 09/12/18   [provider]  fish oil-omega-3 fatty acids 1000 MG capsule Take 1 g by mouth daily.     [provider]  GELATIN PO Take 9 g by mouth daily.    [provider]  Glucosamine HCl (GLUCOSAMINE PO) Take 1 tablet by mouth daily.     [provider]  Misc Natural Products (PROSTATE HEALTH) CAPS Take 1 capsule by mouth daily.    [provider]  nitroGLYCERIN (NITROSTAT) 0.4 MG SL tablet Place 1 tablet (0.4 mg total) under the tongue every 5 (five) minutes as needed for chest pain. 05/12/19   Croitoru, Mihai, MD  omega-3 acid ethyl esters (LOVAZA) 1 g capsule Take by mouth.    [provider]  omeprazole (PRILOSEC) 10 MG capsule Take 10 mg by mouth daily.    [provider]  Polyethyl Glycol-Propyl Glycol (SYSTANE OP) Place 1 drop into both eyes daily as needed (dry eyes).    [provider]  predniSONE (STERAPRED UNI-PAK 48 TAB) 5 MG (48) TBPK tablet TK AS DIRECTED ON PACKAGE 02/26/18   [provider]  simvastatin (ZOCOR) 20 MG tablet TAKE 1 TABLET(20 MG) BY MOUTH AT BEDTIME 06/03/20   Croitoru, Mihai, MD  tamsulosin (FLOMAX) 0.4 MG CAPS capsule Take 0.4 mg by mouth daily after supper.  03/02/13   [provider]  trandolapril (MAVIK) 2 MG tablet TAKE 1 TABLET(2 MG) BY MOUTH DAILY 06/03/20   Croitoru, Dani Gobble, MD    Allergies    Codeine  Review of  Systems   Review of Systems  Constitutional: Negative.   HENT: Negative.    Respiratory:  Positive for shortness of breath. Negative for apnea, cough, choking, chest tightness, wheezing and stridor.   Cardiovascular:  Positive for chest pain and palpitations. Negative for leg swelling.  Gastrointestinal: Negative.   Genitourinary: Negative.   Musculoskeletal: Negative.   Skin: Negative.   Neurological: Negative.   All other systems reviewed and are negative.  Physical Exam Updated Vital Signs BP (!) 165/68   Pulse (!) 49   Temp 97.8 F (36.6 C) (Oral)   Resp (!) 8   Ht 5\' 10"  (1.778 m)   Wt 83.9 kg   SpO2 94%  BMI 26.54 kg/m   Physical Exam Vitals and nursing note reviewed.  Constitutional:      General: He is not in acute distress.    Appearance: He is well-developed. He is not ill-appearing, toxic-appearing or diaphoretic.  HENT:     Head: Atraumatic.  Eyes:     Pupils: Pupils are equal, round, and reactive to light.  Cardiovascular:     Rate and Rhythm: Normal rate and regular rhythm.     Pulses:          Radial pulses are 2+ on the right side and 2+ on the left side.       Dorsalis pedis pulses are 2+ on the right side and 2+ on the left side.     Heart sounds: Normal heart sounds.  Pulmonary:     Effort: Pulmonary effort is normal. No respiratory distress.     Breath sounds: Normal breath sounds.  Chest:     Chest wall: No mass, deformity, tenderness or edema.  Abdominal:     General: Bowel sounds are normal. There is no distension.     Palpations: Abdomen is soft.  Musculoskeletal:        General: Normal range of motion.     Cervical back: Normal range of motion and neck supple.     Right lower leg: No tenderness. No edema.     Left lower leg: No tenderness. No edema.     Comments: No bony tenderness. Compartments soft.  Skin:    General: Skin is warm and dry.     Capillary Refill: Capillary refill takes less than 2 seconds.     Comments: No edema,  erythema, warmth  Neurological:     General: No focal deficit present.     Mental Status: He is alert and oriented to person, place, and time.    ED Results / Procedures / Treatments   Labs (all labs ordered are listed, but only abnormal results are displayed) Labs Reviewed  BASIC METABOLIC PANEL - Abnormal; Notable for the following components:      Result Value   Glucose, Bld 107 (*)    Anion gap 3 (*)    All other components within normal limits  RESP PANEL BY RT-PCR (FLU A&B, COVID) ARPGX2  CBC  TROPONIN I (HIGH SENSITIVITY)  TROPONIN I (HIGH SENSITIVITY)    EKG EKG Interpretation  Date/Time:  Sunday September 25 2020 05:52:01 EDT Ventricular Rate:  49 PR Interval:  220 QRS Duration: 96 QT Interval:  472 QTC Calculation: 426 R Axis:   25 Text Interpretation: Sinus bradycardia with 1st degree A-V block Nonspecific ST and T wave abnormality Abnormal ECG Nonspecific T wave abnormality Confirmed by Ezequiel Essex [phone]) on 09/25/2020 6:11:57 AM  Radiology DG Chest 2 View  Result Date: 09/25/2020 CLINICAL DATA:  87 year old male woke with left side chest pain radiating to the arm. EXAM: CHEST - 2 VIEW COMPARISON:  Chest radiographs 04/19/2014 and earlier. FINDINGS: Prior CABG. Mediastinal contours remain within normal limits. Chronic superficial left chest wall loop recorder or pacer/ICD. Lung volumes are within normal limits. Both lungs appear clear. Visualized tracheal air column is within normal limits. No acute osseous abnormality identified. Abdomen Calcified aortic atherosclerosis. Negative visible bowel gas pattern. IMPRESSION: No acute cardiopulmonary abnormality. Electronically Signed   By: Genevie Ann M.D.   On: 09/25/2020 06:30     Last ECHO 08/11/2015  EF 55-60%   Procedures Procedures   Medications Ordered in ED Medications - No data to  display   ED Course  I have reviewed the triage vital signs and the nursing notes.  Pertinent labs & imaging results that  were available during my care of the patient were reviewed by me and considered in my medical decision making (see chart for details).  Patient her for evaluation of chest discomfort. Afebrile, non septic, non ill appearing. Sx resolved on arrival. Awoken from sleep to left chest, palpitations, left arm pain, tingling, Sob, felt hot without emesis or diaphoresis. Extensive cardiac history.  Labs and imaging interpreted:  CBC without leukocytosis BMP glucose 107 no additional leaving factors Troponin 9 Chest x-ray without significant normality EKG without ischemic changes  CONSULT with Dr. Gillian Shields, cards will see in consult  CONSULT with Dr. Lorin Mercy with [facility] who agrees to evaluate patient for admission.   Patient was seen by Cardiology in th ED. Feel patient was likely having palpitations not ACS. Can be dc from Cards standing point.  Reassessed.  Has been chest pain-free.  We will have him follow-up closely outpatient with cardiology for Holter monitor.  He will return for any worsening symptoms.  The patient has been appropriately medically screened and/or stabilized in the ED. I have low suspicion for any other emergent medical condition which would require further screening, evaluation or treatment in the ED or require inpatient management.  Patient is hemodynamically stable and in no acute distress.  Patient able to ambulate in department prior to ED.  Evaluation does not show acute pathology that would require ongoing or additional emergent interventions while in the emergency department or further inpatient treatment.  I have discussed the diagnosis with the patient and answered all questions.  Pain is been managed while in the emergency department and patient has no further complaints prior to discharge.  Patient is comfortable with plan discussed in room and is stable for discharge at this time.  I have discussed strict return precautions for returning to the emergency department.  Patient  was encouraged to follow-up with PCP/specialist refer to at discharge.       MDM Rules/Calculators/A&P HEAR Score: 7                         Final Clinical Impression(s) / ED Diagnoses Final diagnoses:  Precordial pain    Rx / DC Orders ED Discharge Orders     None        Chon Buhl A, PA-C 09/25/20 1037    Carmin Muskrat, MD 09/25/20 1111

## 2020-09-25 NOTE — Consult Note (Signed)
Cardiology Consultation:   Patient ID: Michael Bryant MRN: [id number]; DOB: December 16, 1933  Admit date: 09/25/2020 Date of Consult: 09/25/2020  PCP:  [name], MD   [facility] Providers Cardiologist:  Sanda Klein, MD        Patient Profile:   Michael Bryant is a 87 y.o. male with a hx of coronary artery disease status post VF arrest in 2003 followed by CABG, nonsustained ventricular tachycardia with associated near syncope controlled on amiodarone, hypertension, hyperlipidemia, bradycardia (not on beta-blocker) who is being seen 09/25/2020 for the evaluation of chest pain at the request of Dr. Vanita Panda.  History of Present Illness:   Michael Bryant was initially seen in 2003 with VF arrest followed by CABG (LIMA-LAD, SVG-OM1, SVG-RCA).  A Myoview in January 2015 was negative for ischemia.  Cardiac catheterization in April 2015 demonstrated patent bypass grafts.  An echocardiogram in June 2017 demonstrated normal LV function with possible hypokinesis of the apical myocardium and mild diastolic dysfunction.  He has a history of syncope as well as noted nonsustained ventricular tachycardia.  He is on amiodarone and has not had recurrent ventricular tachycardia since starting this.  He previously had a loop recorder implanted but it is no longer functional.  An event monitor in 2019 demonstrated no significant arrhythmias.  He did have rare PACs and PVCs.  He was last seen by Dr. Sallyanne Kuster in 12/21.  He presented to the ED with palpitations and Cardiology has been asked to further evaluate.  Data: K 4, SCr 0.89, hs-Trop 9, Hgb 14.4; CXR - no acute abnormality; EKG - no acute changes.  He is here with his wife and daughter.  He notes that he has a lot of sinus congestion and he takes Chlorpheniramine every 4 hours.  He has an alarm set to wake him up around 2 am.  This morning, he noted palpitations described as a hard heartbeat.  He did not have any sense of a rapid beat.  He took NTG at one point but  it is not clear if this provided much relief.  At one point, he did have a strange sensation in his L arm.  This eventually subsided.  He never had chest pain or associated nausea, diaphoresis.  He did feel hot.  He notes that he has these palpitations from time to time when he wakes up in the night but today it seemed to last longer and the L arm symptom concerned him.     Past Medical History:  Diagnosis Date   CAD (coronary artery disease)    VF arrest >>  s/p CABG (2003) // Myoview 1/15: no ischemia // Cath 4/15: all grafts patent   HTN (hypertension)    Hx of VF arrest in 2003 2003   Hyperlipidemia    NSVT (nonsustained ventricular tachycardia) ([place]) 06/16/2013   Rx with amiodarone // Echo 6/17: EF 55-60, Gr 1 DD, apical HK    Past Surgical History:  Procedure Laterality Date   CARDIAC CATHETERIZATION  12/15/2001   60% proximal LAD narrowing with active mobile thrombosis w/50% sequential areas,50-60% pro   CORONARY ARTERY BYPASS GRAFT  2004   LIMA to LAD, SVG to OM, SVG to RCA   LEFT HEART CATHETERIZATION WITH CORONARY/GRAFT ANGIOGRAM N/A 07/02/2013   Procedure: LEFT HEART CATHETERIZATION WITH Beatrix Fetters;  Surgeon: Sanda Klein, MD;  Location: [facility] CATH LAB;  Service: Cardiovascular;  Laterality: N/A;   NM MYOCAR PERF WALL MOTION  12/09/2009   mod. perfusion defect basal inferior  and mid inferior regions c/w  infarct/scar, EF 54%   TONSILLECTOMY AND ADENOIDECTOMY       Home Medications:  Prior to Admission medications   Medication Sig Start Date End Date Taking? Authorizing Provider  amiodarone (PACERONE) 200 MG tablet TAKE 1/2 TABLET(100 MG) BY MOUTH DAILY 06/03/20   Croitoru, Mihai, MD  aspirin EC 81 MG tablet Take 81 mg by mouth daily.    [provider]  beta carotene w/minerals (OCUVITE) tablet Take 1 tablet by mouth daily.    [provider]  chlorpheniramine (EQ CHLORTABS) 4 MG tablet Take 4 mg by mouth daily as needed (allergies).     [provider]  Cholecalciferol (VITAMIN D) 50 MCG (2000 UT) CAPS Take 2,000 Units by mouth daily.    [provider]  CINNAMON PO Take 1 tablet by mouth daily.     [provider]  DICLOFENAC PO Take 100 mg by mouth daily.     [provider]  Diclofenac Potassium 25 MG CAPS Take by mouth.    [provider]  Diclofenac Sodium CR 100 MG 24 hr tablet TK 1 T PO QD 09/12/18   [provider]  fish oil-omega-3 fatty acids 1000 MG capsule Take 1 g by mouth daily.     [provider]  GELATIN PO Take 9 g by mouth daily.    [provider]  Glucosamine HCl (GLUCOSAMINE PO) Take 1 tablet by mouth daily.     [provider]  Misc Natural Products (PROSTATE HEALTH) CAPS Take 1 capsule by mouth daily.    [provider]  nitroGLYCERIN (NITROSTAT) 0.4 MG SL tablet Place 1 tablet (0.4 mg total) under the tongue every 5 (five) minutes as needed for chest pain. 05/12/19   Croitoru, Mihai, MD  omega-3 acid ethyl esters (LOVAZA) 1 g capsule Take by mouth.    [provider]  omeprazole (PRILOSEC) 10 MG capsule Take 10 mg by mouth daily.    [provider]  Polyethyl Glycol-Propyl Glycol (SYSTANE OP) Place 1 drop into both eyes daily as needed (dry eyes).    [provider]  predniSONE (STERAPRED UNI-PAK 48 TAB) 5 MG (48) TBPK tablet TK AS DIRECTED ON PACKAGE 02/26/18   [provider]  simvastatin (ZOCOR) 20 MG tablet TAKE 1 TABLET(20 MG) BY MOUTH AT BEDTIME 06/03/20   Croitoru, Mihai, MD  tamsulosin (FLOMAX) 0.4 MG CAPS capsule Take 0.4 mg by mouth daily after supper.  03/02/13   [provider]  trandolapril (MAVIK) 2 MG tablet TAKE 1 TABLET(2 MG) BY MOUTH DAILY 06/03/20   Croitoru, Dani Gobble, MD   Allergies:    Allergies  Allergen Reactions   Codeine Other (See Comments) and Hives    "wont go to sleep"    Social History:   Social History   Socioeconomic History   Marital status:  Married    Spouse name: Not on file   Number of children: 2   Years of education: Not on file   Highest education level: Not on file  Occupational History   Occupation: Retired  Tobacco Use   Smoking status: Former    Types: Cigarettes   Smokeless tobacco: Former   Tobacco comments:    Quit Loss adjuster, chartered Use: Never used  Substance and Sexual Activity   Alcohol use: No   Drug use: No   Sexual activity: Not on file  Other Topics Concern   Not on file  Social History Narrative   Lives at home with wife.          Social Determinants of Health   Financial Resource Strain: Not on file  Food Insecurity: Not on file  Transportation Needs: Not on file  Physical Activity: Not on file  Stress: Not on file  Social Connections: Not on file  Intimate Partner Violence: Not on file    Family History:     Family History  Problem Relation Age of Onset   Cancer Mother 73   CAD Father 52       Died age 58   Heart disease Father      ROS:  Please see the history of present illness.  No recent fevers, chills, cough, vomiting, diarrhea, weight changes, claudication, melena, hematochezia, hematuria.  All other ROS reviewed and negative.     Physical Exam/Data:   Vitals:   09/25/20 0745 09/25/20 0800 09/25/20 0830 09/25/20 0900  BP: (!) 174/72 (!) 179/71 (!) 168/77 (!) 165/68  Pulse: (!) 51 (!) 52 (!) 52 (!) 49  Resp: 14 12 16  (!) 8  Temp:      TempSrc:      SpO2: 90% 94% 91% 94%  Weight:      Height:       No intake or output data in the 24 hours ending 09/25/20 1003 Last 3 Weights 09/25/2020 02/11/2020 01/27/2019  Weight (lbs) 185 lb 183 lb 178 lb  Weight (kg) 83.915 kg 83.008 kg 80.74 kg     Body mass index is 26.54 kg/m.  General:  Well nourished, well developed, in no acute distress  HEENT: normal Lymph: no adenopathy Neck: no JVD Endocrine:  No thryomegaly Vascular: No carotid bruits  Cardiac:  normal S1, S2; RRR; no murmur   Lungs:  clear to  auscultation bilaterally, no wheezing, rhonchi or rales  Abd: soft, nontender, no hepatomegaly  Ext: no edema Musculoskeletal:  No deformities  Skin: warm and dry  Neuro:  CNs 2-12 intact, no focal abnormalities noted Psych:  Normal affect   EKG:  The EKG was personally reviewed and demonstrates:  Sinus bradycardia, HR 49, normal axis, first-degree AV block, PR interval 220 ms, nonspecific ST-T wave changes, QTC 426 ms   Relevant CV Studies:  N/a   Laboratory Data:  High Sensitivity Troponin:   Recent Labs  Lab 09/25/20 0603 09/25/20 0756  TROPONINIHS 9 8     Chemistry Recent Labs  Lab 09/25/20 0603  NA 141  K 4.0  CL 106  CO2 32  GLUCOSE 107*  BUN 23  CREATININE 0.89  CALCIUM 9.0  GFRNONAA >60  ANIONGAP 3*    No results for input(s): PROT, ALBUMIN, AST, ALT, ALKPHOS, BILITOT in the last 168 hours. Hematology Recent Labs  Lab 09/25/20 0603  WBC 6.8  RBC 4.62  HGB 14.4  HCT 44.1  MCV 95.5  MCH 31.2  MCHC 32.7  RDW 13.8  PLT 205   BNPNo results for input(s): BNP, PROBNP in the last 168 hours.  DDimer No results for input(s): DDIMER in the last 168 hours.   Radiology/Studies:  DG Chest 2 View  Result Date: 09/25/2020 CLINICAL DATA:  87 year old male woke with left side chest pain radiating to the arm. EXAM: CHEST - 2 VIEW COMPARISON:  Chest radiographs 04/19/2014 and earlier. FINDINGS: Prior CABG. Mediastinal contours remain within normal limits. Chronic superficial left chest wall loop recorder or pacer/ICD. Lung volumes are within normal limits. Both lungs appear clear. Visualized tracheal air  column is within normal limits. No acute osseous abnormality identified. Abdomen Calcified aortic atherosclerosis. Negative visible bowel gas pattern. IMPRESSION: No acute cardiopulmonary abnormality. Electronically Signed   By: Genevie Ann M.D.   On: 09/25/2020 06:30     Assessment and Plan:   Palpitations Symptoms are suspicious for PVCs vs PACs.  He did not  have rapid palpitations or syncope/near syncope.  Electrolytes are normal.  His TSH in 05/2020 was normal.  Suspect he can have an outpatient event monitor arranged.  Coronary artery disease  S/p VF arrest s/p CABG in 2003.  Cardiac catheterization in 2015 with patent grafts.  EF has been normal in the past.  He did have some L arm symptoms with his palpitations today.  However, he has not had chest pain or exertional shortness of breath.  His hs-Troponins are negative and EKG does not show any acute changes.  Continue ASA, statin.  No beta-blocker due to bradycardia.  Consider outpatient Myoview.   NSVT Controlled on Amiodarone.  TSH and ALT in 3/22 were normal.  QTc ok.    Hypertension  BP high.  He has not had his Trandolapril yet today.    Hyperlipidemia  Continue simvastatin.   Risk Assessment/Risk Scores:     HEAR Score (for undifferentiated chest pain):  HEAR Score: 7      [facility] will sign off.   Medication Recommendations:  Continue home medications.  Other recommendations (labs, testing, etc):  none  Follow up as an outpatient:  Dr. Sallyanne Kuster or APP in next 2-3 weeks.  Can consider Event Monitor +/- Myoview at f/u visit depending on symptoms.   For questions or updates, please contact [place] Please consult [email or web address] for contact info under    Signed, Richardson Dopp, PA-C  09/25/2020 10:03 AM

## 2020-09-25 NOTE — ED Triage Notes (Signed)
Per EMS, pt woke up w/ sudden onset of left sided chest discomfort that radiates to the left arm.  States he had a "hot period" but denes nausea/sob.  Pt took one nitro which relived pain prior to EMS arrival.    Hx of cardiac arrest but this feels different.  181/76 94% RA 40-50s HR 18G L AC

## 2020-10-27 ENCOUNTER — Telehealth: Payer: Self-pay | Admitting: Cardiovascular Disease

## 2020-10-27 NOTE — Telephone Encounter (Signed)
Patient c/o Palpitations:  High priority if patient c/o lightheadedness, shortness of breath, or chest pain  How long have you had palpitations/irregular HR/ Afib? Are you having the symptoms now? A month a go they started  Are you currently experiencing lightheadedness, SOB or CP? No   Do you have a history of afib (atrial fibrillation) or irregular heart rhythm? Not sure  Have you checked your BP or HR? (document readings if available): no   Are you experiencing any other symptoms? No patient not feeling well today

## 2020-10-27 NOTE — Telephone Encounter (Signed)
Patient staes he woke up this morning Out 5 am   irregular heart rate  sweating a n some chest tightness.   Patient stays he has not felt well all day , but was able to things outside in the garden this afternoon.  Patient recent went ER an was given an appointment 6 weeks from then on 11/07/20.   Appointment moved up to tomorrow 8/119/22 Patient verbalized understanding- aware the appointment is at [facility] location. Direction given. Patient verbalized understanding.

## 2020-10-28 ENCOUNTER — Ambulatory Visit (HOSPITAL_BASED_OUTPATIENT_CLINIC_OR_DEPARTMENT_OTHER): Payer: Medicare Other | Admitting: Family

## 2020-10-28 ENCOUNTER — Other Ambulatory Visit: Payer: Self-pay

## 2020-10-28 ENCOUNTER — Encounter (HOSPITAL_BASED_OUTPATIENT_CLINIC_OR_DEPARTMENT_OTHER): Payer: Self-pay | Admitting: Family

## 2020-10-28 VITALS — BP 160/70 | HR 56 | Ht 70.0 in | Wt 178.0 lb

## 2020-10-28 DIAGNOSIS — I25118 Atherosclerotic heart disease of native coronary artery with other forms of angina pectoris: Secondary | ICD-10-CM | POA: Diagnosis not present

## 2020-10-28 DIAGNOSIS — R072 Precordial pain: Secondary | ICD-10-CM | POA: Diagnosis not present

## 2020-10-28 DIAGNOSIS — I4729 Other ventricular tachycardia: Secondary | ICD-10-CM

## 2020-10-28 DIAGNOSIS — E785 Hyperlipidemia, unspecified: Secondary | ICD-10-CM | POA: Diagnosis not present

## 2020-10-28 DIAGNOSIS — I472 Ventricular tachycardia: Secondary | ICD-10-CM

## 2020-10-28 DIAGNOSIS — I10 Essential (primary) hypertension: Secondary | ICD-10-CM | POA: Diagnosis not present

## 2020-10-28 NOTE — Progress Notes (Signed)
Office Visit    Patient Name: Michael Bryant Date of Encounter: 10/28/2020  PCP:  [name], MD   [facility]  Cardiologist:  Sanda Klein, MD  Advanced Practice Provider:  No care team member to display Electrophysiologist:  None   Chief Complaint    Michael Bryant is a 87 y.o. male with a hx of coronary artery disease (cardiac arrest in 2003 with CABG x3 LIMA-LAD, VG-2 OM 1, VG-distal RCA), near syncope related to nonsustained VT, hyperlipidemia presents today for chest pain   Past Medical History    Past Medical History:  Diagnosis Date   CAD (coronary artery disease)    VF arrest >>  s/p CABG (2003) // Myoview 1/15: no ischemia // Cath 4/15: all grafts patent   HTN (hypertension)    Hx of VF arrest in 2003 2003   Hyperlipidemia    NSVT (nonsustained ventricular tachycardia) ([place]) 06/16/2013   Rx with amiodarone // Echo 6/17: EF 55-60, Gr 1 DD, apical HK   Past Surgical History:  Procedure Laterality Date   CARDIAC CATHETERIZATION  12/15/2001   60% proximal LAD narrowing with active mobile thrombosis w/50% sequential areas,50-60% pro   CORONARY ARTERY BYPASS GRAFT  2004   LIMA to LAD, SVG to OM, SVG to RCA   LEFT HEART CATHETERIZATION WITH CORONARY/GRAFT ANGIOGRAM N/A 07/02/2013   Procedure: LEFT HEART CATHETERIZATION WITH Beatrix Fetters;  Surgeon: Sanda Klein, MD;  Location: [facility];  Service: Cardiovascular;  Laterality: N/A;   NM MYOCAR PERF WALL MOTION  12/09/2009   mod. perfusion defect basal inferior and mid inferior regions c/w  infarct/scar, EF 54%   TONSILLECTOMY AND ADENOIDECTOMY      Allergies  Allergies  Allergen Reactions   Codeine Other (See Comments) and Hives    "wont go to sleep"    History of Present Illness    Michael Bryant is a 87 y.o. male with a hx of coronary artery disease (cardiac arrest in 2003 with CABG x3 LIMA-LAD, VG-2 OM 1, VG-distal RCA), near syncope related to nonsustained VT,  hyperlipidemia last seen 02/11/20 by Dr. Sallyanne Kuster.  Initially seen for emergent CABG 12/2001. Subsequent 03/2013 stress myoview negative for ischemia with EF 60%. 06/2013 he had syncope following secual intercourse and was noted to have frequent PVCs and NSVT by EMS. Cardiac catheterization 07/02/13 with severe native coronary disease, graft dependent, widely patent bypass grafts.   Beta blockers previously avoided due to bradycardia. He has been able to tolerate low dose Amlodipine. Previous ILR but reached RRT 11/20/2016. Event monitor 2019 with rare PACs and PVCs.   He was last seen 02/11/20 by Dr. Sallyanne Kuster. He was doing well from cardiac perspective with no chest pain.   Seen in ED 09/25/20 due to chest pain and "thumping and pressure". He was evaluated by cardiology and symptoms had resolved. HS troponin 9 and 8. No evidence of acute coronary symptoms. Recommended for outpatient follow up and possible stress test and monitor. He called the office 10/27/20 noting palpitations waking him from sleep and chest tightness.   He presents today for follow up. Reports home BP today was 137/86. He becomes symptomatic when his heart rate is in the 90s it will feel like it is "racing" which has been waking him from sleep. He takes his Amiodarone consistently. Denies shortness of breath, dyspnea. Tell sme he does not really have chest "pain" but an occasional tightness.   EKGs/Labs/Other Studies Reviewed:   The following  studies were reviewed today:  EKG: No EKG today. EKG independently reviewed from 09/26/20 demonstrated sinus bradycardia 49 bpm with first degree AV  block and no acute ST/T wave changes.   Recent Labs: 09/25/2020: BUN 23; Creatinine, Ser 0.89; Hemoglobin 14.4; Platelets 205; Potassium 4.0; Sodium 141  Recent Lipid Panel    Component Value Date/Time   CHOL 122 03/25/2013 0312   TRIG 104 03/25/2013 0312   HDL 52 03/25/2013 0312   CHOLHDL 2.3 03/25/2013 0312   VLDL 21 03/25/2013 0312    LDLCALC 49 03/25/2013 0312    Home Medications   Current Meds  Medication Sig   amiodarone (PACERONE) 100 MG tablet 50 mg daily.   aspirin EC 81 MG tablet Take 81 mg by mouth daily.   beta carotene w/minerals (OCUVITE) tablet Take 1 tablet by mouth daily.   chlorpheniramine (CHLOR-TRIMETON) 4 MG tablet Take 4 mg by mouth daily as needed (allergies).    Cholecalciferol (VITAMIN D) 50 MCG (2000 UT) CAPS Take 2,000 Units by mouth daily.   CINNAMON PO Take 1 tablet by mouth daily.    DICLOFENAC PO Take 100 mg by mouth daily.    fish oil-omega-3 fatty acids 1000 MG capsule Take 1 g by mouth daily.    GELATIN PO Take 9 g by mouth daily.   Glucosamine 500 MG CAPS daily.   Misc Natural Products (PROSTATE HEALTH) CAPS Take 1 capsule by mouth daily.   nitroGLYCERIN (NITROSTAT) 0.4 MG SL tablet Place 1 tablet (0.4 mg total) under the tongue every 5 (five) minutes as needed for chest pain.   omeprazole (PRILOSEC) 10 MG capsule Take 10 mg by mouth daily.   Polyethyl Glycol-Propyl Glycol (SYSTANE OP) Place 1 drop into both eyes daily as needed (dry eyes).   simvastatin (ZOCOR) 20 MG tablet TAKE 1 TABLET(20 MG) BY MOUTH AT BEDTIME   tamsulosin (FLOMAX) 0.4 MG CAPS capsule Take 0.4 mg by mouth daily after supper.    trandolapril (MAVIK) 2 MG tablet TAKE 1 TABLET(2 MG) BY MOUTH DAILY     Review of Systems      All other systems reviewed and are otherwise negative except as noted above.  Physical Exam    VS:  BP (!) 160/70   Pulse (!) 56   Ht '5\' 10"'$  (1.778 m)   Wt 178 lb (80.7 kg)   SpO2 97%   BMI 25.54 kg/m  , BMI Body mass index is 25.54 kg/m.  Wt Readings from Last 3 Encounters:  10/28/20 178 lb (80.7 kg)  09/25/20 185 lb (83.9 kg)  02/11/20 183 lb (83 kg)     GEN: Well nourished, well developed, in no acute distress. HEENT: normal. Neck: Supple, no JVD, carotid bruits, or masses. Cardiac: RRR, no murmurs, rubs, or gallops. No clubbing, cyanosis, edema.  Radials/PT 2+ and equal  bilaterally.  Respiratory:  Respirations regular and unlabored, clear to auscultation bilaterally. GI: Soft, nontender, nondistended. MS: No deformity or atrophy. Skin: Warm and dry, no rash. Neuro:  Strength and sensation are intact. Psych: Normal affect.  Assessment & Plan    CAD s/p CABG - Reports episodes of palpitations and chest tightness waking him from sleep. Given persistent chest pain, plan for lexiscan myoview. GDMT includes aspirin, statin. No beta blocker due to bradycardia. If lexiscan myoview unrevealing, plan for ZIO monitor. Heart healthy diet and regular cardiovascular exercise encouraged.    NSVT - Recurrent palpitations. We discussed ZIO monitor. He prefers to start with stress test and discuss ZIO  at follow up. Continue Amiodarone '50mg'$  daily. No BB due to bradycardia.   HLD - Continue Simvastatin. Reports intolerance to multiple other statins.   On Amiodarone therapy - No signs of toxicity. On Amiodarone '50mg'$  daily. Continue to monitor liver enzymes and TSH every 6 onths.   HTN - He is hesitant regardin gmedication changes as reports home BP in the 120s-130s. Hom emonitorin glog provided. Will continue present antihypertensive regimen. Consider escalation of therapy at follow up if home BP elevated by review.   Disposition: Follow up in 1 month(s) with Dr. Sallyanne Kuster or APP.  Signed, Loel Dubonnet, NP 10/28/2020, 3:45 PM [facility]

## 2020-10-28 NOTE — Patient Instructions (Addendum)
Medication Instructions:  Continue your current medications.   *If you need a refill on your cardiac medications before your next appointment, please call your pharmacy*  Lab Work: None ordered today.   Testing/Procedures: Your physician has requested that you have a lexiscan myoview. Please follow instruction sheet, as given.  Follow-Up: At [facility], you and your health needs are our priority.  As part of our continuing mission to provide you with exceptional heart care, we have created designated Provider Care Teams.  These Care Teams include your primary Cardiologist (physician) and Advanced Practice Providers (APPs -  Physician Assistants and Nurse Practitioners) who all work together to provide you with the care you need, when you need it.  We recommend signing up for the patient portal called "MyChart".  Sign up information is provided on this After Visit Summary.  MyChart is used to connect with patients for Virtual Visits (Telemedicine).  Patients are able to view lab/test results, encounter notes, upcoming appointments, etc.  Non-urgent messages can be sent to your provider as well.   To learn more about what you can do with MyChart, go to [email or web address].    Your next appointment:   1 month(s)  The format for your next appointment:   In Person  Provider:   You may see Sanda Klein, MD or one of the following Advanced Practice Providers on your designated Care Team:   Almyra Deforest, PA-C Fabian Sharp, [place] or  Roby Lofts, [place]   Other Instructions  Lexiscan Myoview Instructions The test will take approximately 3 to 4 hours to complete; you may bring reading material.  If someone comes with you to your appointment, they will need to remain in the main lobby due to limited space in the testing area.   How to prepare for your Myocardial Perfusion Test: Do not eat or drink 3 hours prior to your test, except you may have water. Do not consume products containing  caffeine (regular or decaffeinated) 12 hours prior to your test. (ex: coffee, chocolate, sodas, tea). Do bring a list of your current medications with you.  If not listed below, you may take your medications as normal. Do wear comfortable clothes (no dresses or overalls) and walking shoes, tennis shoes preferred (No heels or open toe shoes are allowed). Do NOT wear cologne, perfume, aftershave, or lotions (deodorant is allowed). If these instructions are not followed, your test will have to be rescheduled.  Please report to [place], [address] for your test.  If you have questions or concerns about your appointment, you can call the Nuclear Lab at [phone].  If you cannot keep your appointment, please provide 24 hours notification to the Nuclear Lab, to avoid a possible $50 charge to your account.  Tips to Measure your Blood Pressure Correctly  To determine whether you have hypertension, a medical professional will take a blood pressure reading. How you prepare for the test, the position of your arm, and other factors can change a blood pressure reading by 10% or more. That could be enough to hide high blood pressure, start you on a drug you don't really need, or lead your doctor to incorrectly adjust your medications.  National and international guidelines offer specific instructions for measuring blood pressure. If a doctor, nurse, or medical assistant isn't doing it right, don't hesitate to ask him or her to get with the guidelines.  Here's what you can do to ensure a correct reading:  Don't drink a caffeinated beverage or  smoke during the 30 minutes before the test.  Sit quietly for five minutes before the test begins.  During the measurement, sit in a chair with your feet on the floor and your arm supported so your elbow is at about heart level.  The inflatable part of the cuff should completely cover at least 80% of your upper arm, and the cuff should be placed on bare  skin, not over a shirt.  Don't talk during the measurement.  Have your blood pressure measured twice, with a brief break in between. If the readings are different by 5 points or more, have it done a third time.  In 2017, new guidelines from the [employer], the [employer] of Cardiology, and nine other health organizations lowered the diagnosis of high blood pressure to 130/80 mm Hg or higher for all adults. The guidelines also redefined the various blood pressure categories to now include normal, elevated, Stage 1 hypertension, Stage 2 hypertension, and hypertensive crisis (see "Blood pressure categories").  Blood pressure categories  Blood pressure category SYSTOLIC (upper number)  DIASTOLIC (lower number)  Normal Less than 120 mm Hg and Less than 80 mm Hg  Elevated 120-129 mm Hg and Less than 80 mm Hg  High blood pressure: Stage 1 hypertension 130-139 mm Hg or 80-89 mm Hg  High blood pressure: Stage 2 hypertension 140 mm Hg or higher or 90 mm Hg or higher  Hypertensive crisis (consult your doctor immediately) Higher than 180 mm Hg and/or Higher than 120 mm Hg  Source: American Heart Association and American Stroke Association. For more on getting your blood pressure under control, buy Controlling Your Blood Pressure, a Special Health Report from [facility].   Blood Pressure Log   Date   Time  Blood Pressure  Position  Example: Nov 1 9 AM 124/78 sitting

## 2020-10-30 ENCOUNTER — Encounter (HOSPITAL_BASED_OUTPATIENT_CLINIC_OR_DEPARTMENT_OTHER): Payer: Self-pay | Admitting: Family

## 2020-11-02 ENCOUNTER — Encounter (HOSPITAL_COMMUNITY): Payer: Medicare Other

## 2020-11-07 ENCOUNTER — Ambulatory Visit: Payer: Medicare Other | Admitting: Medical

## 2020-11-07 ENCOUNTER — Encounter (HOSPITAL_COMMUNITY): Payer: Self-pay | Admitting: Family

## 2020-11-09 ENCOUNTER — Telehealth (HOSPITAL_COMMUNITY): Payer: Self-pay | Admitting: *Deleted

## 2020-11-09 ENCOUNTER — Encounter: Payer: Self-pay | Admitting: Cardiovascular Disease

## 2020-11-09 NOTE — Telephone Encounter (Signed)
Close encounter 

## 2020-11-10 ENCOUNTER — Other Ambulatory Visit: Payer: Self-pay

## 2020-11-10 ENCOUNTER — Ambulatory Visit (HOSPITAL_COMMUNITY)
Admission: RE | Admit: 2020-11-10 | Discharge: 2020-11-10 | Disposition: A | Discharge status: Home / Self Care | Payer: Medicare Other | Source: Ambulatory Visit | Attending: Internal Medicine | Admitting: Internal Medicine

## 2020-11-10 DIAGNOSIS — R072 Precordial pain: Secondary | ICD-10-CM | POA: Insufficient documentation

## 2020-11-10 DIAGNOSIS — I25118 Atherosclerotic heart disease of native coronary artery with other forms of angina pectoris: Secondary | ICD-10-CM | POA: Diagnosis not present

## 2020-11-10 LAB — MYOCARDIAL PERFUSION IMAGING
LV dias vol: 134 mL (ref 62–150)
LV sys vol: 73 mL
Nuc Stress EF: 45 %
Peak HR: 55 {beats}/min
Rest HR: 49 {beats}/min
Rest Nuclear Isotope Dose: 10.8 mCi
SDS: 1
SRS: 5
SSS: 6
ST Depression (mm): 0 mm
Stress Nuclear Isotope Dose: 31.3 mCi
TID: 1.25

## 2020-11-10 MED ORDER — TECHNETIUM TC 99M TETROFOSMIN IV KIT
31.3000 | PACK | Freq: Once | INTRAVENOUS | Status: AC | PRN
  Administered 2020-11-10: 31.3 via INTRAVENOUS
  Filled 2020-11-10: qty 32

## 2020-11-10 MED ORDER — TECHNETIUM TC 99M TETROFOSMIN IV KIT
10.8000 | PACK | Freq: Once | INTRAVENOUS | Status: AC | PRN
  Administered 2020-11-10: 10.8 via INTRAVENOUS
  Filled 2020-11-10: qty 11

## 2020-11-10 MED ORDER — REGADENOSON 0.4 MG/5ML IV SOLN
0.4000 mg | Freq: Once | INTRAVENOUS | Status: AC
  Administered 2020-11-10: 0.4 mg via INTRAVENOUS

## 2020-11-11 ENCOUNTER — Other Ambulatory Visit: Payer: Self-pay | Admitting: *Deleted

## 2020-11-11 DIAGNOSIS — I252 Old myocardial infarction: Secondary | ICD-10-CM

## 2020-11-11 DIAGNOSIS — R931 Abnormal findings on diagnostic imaging of heart and coronary circulation: Secondary | ICD-10-CM

## 2020-12-01 ENCOUNTER — Other Ambulatory Visit: Payer: Self-pay

## 2020-12-01 ENCOUNTER — Encounter (HOSPITAL_BASED_OUTPATIENT_CLINIC_OR_DEPARTMENT_OTHER): Payer: Self-pay | Admitting: Family

## 2020-12-01 ENCOUNTER — Ambulatory Visit (HOSPITAL_BASED_OUTPATIENT_CLINIC_OR_DEPARTMENT_OTHER): Payer: Medicare Other | Admitting: Family

## 2020-12-01 VITALS — BP 148/70 | HR 57 | Ht 70.0 in | Wt 180.8 lb

## 2020-12-01 DIAGNOSIS — Z79899 Other long term (current) drug therapy: Secondary | ICD-10-CM | POA: Diagnosis not present

## 2020-12-01 DIAGNOSIS — I25118 Atherosclerotic heart disease of native coronary artery with other forms of angina pectoris: Secondary | ICD-10-CM

## 2020-12-01 DIAGNOSIS — I10 Essential (primary) hypertension: Secondary | ICD-10-CM

## 2020-12-01 DIAGNOSIS — E785 Hyperlipidemia, unspecified: Secondary | ICD-10-CM | POA: Diagnosis not present

## 2020-12-01 MED ORDER — TRANDOLAPRIL 4 MG PO TABS: 4.0000 mg | ORAL_TABLET | Freq: Every day | ORAL | 1 refills | Status: DC

## 2020-12-01 NOTE — Progress Notes (Signed)
Office Visit    Patient Name: Michael Bryant Date of Encounter: 12/01/2020  PCP:  Kathyrn Lass   [place]  Cardiologist:  Sanda Klein, MD  Advanced Practice Provider:  No care team member to display Electrophysiologist:  None   Chief Complaint    Michael Bryant is a 87 y.o. male with a hx of coronary artery disease (cardiac arrest in 2003 with CABG x3 LIMA-LAD, VG-2 OM 1, VG-distal RCA), near syncope related to nonsustained VT, hyperlipidemia presents today for follow up after stress test.   Past Medical History    Past Medical History:  Diagnosis Date   CAD (coronary artery disease)    VF arrest >>  s/p CABG (2003) // Myoview 1/15: no ischemia // Cath 4/15: all grafts patent   HTN (hypertension)    Hx of VF arrest in 2003 2003   Hyperlipidemia    NSVT (nonsustained ventricular tachycardia) ([place]) 06/16/2013   Rx with amiodarone // Echo 6/17: EF 55-60, Gr 1 DD, apical HK   Past Surgical History:  Procedure Laterality Date   CARDIAC CATHETERIZATION  12/15/2001   60% proximal LAD narrowing with active mobile thrombosis w/50% sequential areas,50-60% pro   CORONARY ARTERY BYPASS GRAFT  2004   LIMA to LAD, SVG to OM, SVG to RCA   LEFT HEART CATHETERIZATION WITH CORONARY/GRAFT ANGIOGRAM N/A 07/02/2013   Procedure: LEFT HEART CATHETERIZATION WITH Beatrix Fetters;  Surgeon: Sanda Klein, MD;  Location: [facility];  Service: Cardiovascular;  Laterality: N/A;   NM MYOCAR PERF WALL MOTION  12/09/2009   mod. perfusion defect basal inferior and mid inferior regions c/w  infarct/scar, EF 54%   TONSILLECTOMY AND ADENOIDECTOMY      Allergies  Allergies  Allergen Reactions   Codeine Other (See Comments) and Hives    "wont go to sleep"    History of Present Illness    Michael Bryant is a 87 y.o. male with a hx of coronary artery disease (cardiac arrest in 2003 with CABG x3 LIMA-LAD, VG-2 OM 1, VG-distal RCA), near syncope related to nonsustained VT,  hyperlipidemia last seen 10/28/20.  Initially seen for emergent CABG 12/2001. Subsequent 03/2013 stress myoview negative for ischemia with EF 60%. 06/2013 he had syncope following secual intercourse and was noted to have frequent PVCs and NSVT by EMS. Cardiac catheterization 07/02/13 with severe native coronary disease, graft dependent, widely patent bypass grafts.   Beta blockers previously avoided due to bradycardia. He has been able to tolerate low dose Amlodipine. Previous ILR but reached RRT 11/20/2016. Event monitor 2019 with rare PACs and PVCs.   He was seen 02/11/20 by Dr. Sallyanne Kuster. He was doing well from cardiac perspective with no chest pain.  Seen in ED 09/25/20 due to chest pain and "thumping and pressure". He was evaluated by cardiology and symptoms had resolved. HS troponin 9 and 8. No evidence of acute coronary symptoms. Recommended for outpatient follow up and possible stress test and monitor. He called the office 10/27/20 noting palpitations waking him from sleep and chest tightness.   Seen 10/28/20 noting heart racing and chest tightness. He had stress test which was intermediate risk due to prior infarction with no new ischemia and LVEF 45%. Echocardiogram was ordered for assessment of LVEF but not yet performed.   Presents today for follow up with wife and his friend. Reports no shortness of breath nor dyspnea on exertion. Reports no chest pain, pressure, or tightness. No edema, orthopnea, PND. Reports no  palpitations.  Notes some night sweats which have been ongoing for years but recently progressed and wonders if elevated BP at home contributory. Home BP readings 140s-160s.   EKGs/Labs/Other Studies Reviewed:   The following studies were reviewed today:  EKG: No EKG today. EKG independently reviewed from 09/26/20 demonstrated sinus bradycardia 49 bpm with first degree AV  block and no acute ST/T wave changes.   Recent Labs: 09/25/2020: BUN 23; Creatinine, Ser 0.89; Hemoglobin 14.4;  Platelets 205; Potassium 4.0; Sodium 141  Recent Lipid Panel    Component Value Date/Time   CHOL 122 03/25/2013 0312   TRIG 104 03/25/2013 0312   HDL 52 03/25/2013 0312   CHOLHDL 2.3 03/25/2013 0312   VLDL 21 03/25/2013 0312   LDLCALC 49 03/25/2013 0312    Home Medications   Current Meds  Medication Sig   amiodarone (PACERONE) 100 MG tablet 50 mg daily.   aspirin EC 81 MG tablet Take 81 mg by mouth daily.   beta carotene w/minerals (OCUVITE) tablet Take 1 tablet by mouth daily.   chlorpheniramine (CHLOR-TRIMETON) 4 MG tablet Take 4 mg by mouth daily as needed (allergies).    Cholecalciferol (VITAMIN D) 50 MCG (2000 UT) CAPS Take 2,000 Units by mouth daily.   CINNAMON PO Take 1 tablet by mouth daily.    DICLOFENAC PO Take 100 mg by mouth daily.    fish oil-omega-3 fatty acids 1000 MG capsule Take 1 g by mouth daily.    GELATIN PO Take 9 g by mouth daily.   Glucosamine 500 MG CAPS daily.   Misc Natural Products (PROSTATE HEALTH) CAPS Take 1 capsule by mouth daily.   nitroGLYCERIN (NITROSTAT) 0.4 MG SL tablet Place 1 tablet (0.4 mg total) under the tongue every 5 (five) minutes as needed for chest pain.   omeprazole (PRILOSEC) 10 MG capsule Take 10 mg by mouth daily.   Polyethyl Glycol-Propyl Glycol (SYSTANE OP) Place 1 drop into both eyes daily as needed (dry eyes).   simvastatin (ZOCOR) 20 MG tablet TAKE 1 TABLET(20 MG) BY MOUTH AT BEDTIME   tamsulosin (FLOMAX) 0.4 MG CAPS capsule Take 0.4 mg by mouth daily after supper.    trandolapril (MAVIK) 2 MG tablet TAKE 1 TABLET(2 MG) BY MOUTH DAILY     Review of Systems      All other systems reviewed and are otherwise negative except as noted above.  Physical Exam    VS:  BP (!) 148/70   Pulse (!) 57   Ht 5\' 10"  (1.778 m)   Wt 180 lb 12.8 oz (82 kg)   BMI 25.94 kg/m  , BMI Body mass index is 25.94 kg/m.  Wt Readings from Last 3 Encounters:  12/01/20 180 lb 12.8 oz (82 kg)  11/10/20 178 lb (80.7 kg)  10/28/20 178 lb (80.7  kg)     GEN: Well nourished, well developed, in no acute distress. HEENT: normal. Neck: Supple, no JVD, carotid bruits, or masses. Cardiac: RRR, no murmurs, rubs, or gallops. No clubbing, cyanosis, edema.  Radials/PT 2+ and equal bilaterally.  Respiratory:  Respirations regular and unlabored, clear to auscultation bilaterally. GI: Soft, nontender, nondistended. MS: No deformity or atrophy. Skin: Warm and dry, no rash. Neuro:  Strength and sensation are intact. Psych: Normal affect.  Assessment & Plan    CAD s/p CABG - Recent stress test with no ischemia. Did have low LVEF 45% with upcoming echo 12/05/20 to confirm. GDMT includes aspirin, statin. No beta blocker due to bradycardia. Heart healthy diet  and regular cardiovascular exercise encouraged.    NSVT - No recent palpitations. Continue Amiodarone 50mg  daily. No BB due to bradycardia.   HLD - Continue Simvastatin. Reports intolerance to multiple other statins.   On Amiodarone therapy - No signs of toxicity. On Amiodarone 50mg  daily. Continue to monitor liver enzymes and TSH every 6 months.   HTN - BP elevated home readings 140s-160s. Increase Trandolapril to 4mg  daily with repeat BMP in 2 weeks for monitoring. Future considerations include addition of HCTZ. Avoid AV nodal blocking agents due to previous bradycardia.   Disposition: Follow up in 3 months with Dr. Sallyanne Kuster or APP.  Signed, Loel Dubonnet, NP 12/01/2020, 2:26 PM [facility]

## 2020-12-01 NOTE — Patient Instructions (Signed)
Medication Instructions:  Your physician has recommended you make the following change in your medication:   CHANGE Trandolapril to 4mg  daily  *If you need a refill on your cardiac medications before your next appointment, please call your pharmacy*  Lab Work: Your physician recommends that you return for lab work in 2 week for BMP. You do not need to be fasting.   Your provider has recommended lab work. Please have this collected at [facility] [place]. The lab is open 8:00 am - 4:30 pm. Please avoid 12:00p - 1:00p for lunch hour. You do not need an appointment. Please go to [address] [place], [place] [address]. This is in the Primary Care office on the 3rd floor, let them know you are there for blood work and they will direct you to the lab.   If you have labs (blood work) drawn today and your tests are completely normal, you will receive your results only by: [place] (if you have MyChart) OR A paper copy in the mail If you have any lab test that is abnormal or we need to change your treatment, we will call you to review the results.   Testing/Procedures: None ordered today.   Follow-Up: At [facility], you and your health needs are our priority.  As part of our continuing mission to provide you with exceptional heart care, we have created designated Provider Care Teams.  These Care Teams include your primary Cardiologist (physician) and Advanced Practice Providers (APPs -  Physician Assistants and Nurse Practitioners) who all work together to provide you with the care you need, when you need it.  We recommend signing up for the patient portal called "MyChart".  Sign up information is provided on this After Visit Summary.  MyChart is used to connect with patients for Virtual Visits (Telemedicine).  Patients are able to view lab/test results, encounter notes, upcoming appointments, etc.  Non-urgent messages can be sent to your provider as well.    To learn more about what you can do with MyChart, go to [email or web address].    Your next appointment:   In December as scheduled with Dr. Sallyanne Kuster   Other Instructions  Tips to Measure your Blood Pressure Correctly  Here's what you can do to ensure a correct reading:  Don't drink a caffeinated beverage or smoke during the 30 minutes before the test.  Sit quietly for five minutes before the test begins.  During the measurement, sit in a chair with your feet on the floor and your arm supported so your elbow is at about heart level.  The inflatable part of the cuff should completely cover at least 80% of your upper arm, and the cuff should be placed on bare skin, not over a shirt.  Don't talk during the measurement.  Have your blood pressure measured twice, with a brief break in between. If the readings are different by 5 points or more, have it done a third time. Blood pressure categories  Blood pressure category SYSTOLIC (upper number)  DIASTOLIC (lower number)  Normal Less than 120 mm Hg and Less than 80 mm Hg  Elevated 120-129 mm Hg and Less than 80 mm Hg  High blood pressure: Stage 1 hypertension 130-139 mm Hg or 80-89 mm Hg  High blood pressure: Stage 2 hypertension 140 mm Hg or higher or 90 mm Hg or higher  Hypertensive crisis (consult your doctor immediately) Higher than 180 mm Hg and/or Higher than 120 mm Hg  Source:  American Heart Association and American Stroke Association. For more on getting your blood pressure under control, buy Controlling Your Blood Pressure, a Special Health Report from [facility].   Blood Pressure Log   Date   Time  Blood Pressure  Example: Nov 1 9 AM 124/78

## 2020-12-05 ENCOUNTER — Ambulatory Visit (HOSPITAL_COMMUNITY): Payer: Medicare Other | Attending: Cardiology

## 2020-12-05 ENCOUNTER — Other Ambulatory Visit: Payer: Self-pay

## 2020-12-05 DIAGNOSIS — R931 Abnormal findings on diagnostic imaging of heart and coronary circulation: Secondary | ICD-10-CM | POA: Insufficient documentation

## 2020-12-05 DIAGNOSIS — I252 Old myocardial infarction: Secondary | ICD-10-CM | POA: Insufficient documentation

## 2020-12-05 LAB — ECHOCARDIOGRAM COMPLETE
Area-P 1/2: 2.8 cm2
S' Lateral: 3.1 cm

## 2020-12-16 ENCOUNTER — Telehealth: Payer: Self-pay | Admitting: *Deleted

## 2020-12-16 DIAGNOSIS — I2510 Atherosclerotic heart disease of native coronary artery without angina pectoris: Secondary | ICD-10-CM

## 2020-12-16 DIAGNOSIS — R899 Unspecified abnormal finding in specimens from other organs, systems and tissues: Secondary | ICD-10-CM

## 2020-12-16 LAB — BASIC METABOLIC PANEL
BUN/Creatinine Ratio: 22 (ref 10–24)
BUN: 28 mg/dL — ABNORMAL HIGH (ref 8–27)
CO2: 25 mmol/L (ref 20–29)
Calcium: 9.4 mg/dL (ref 8.6–10.2)
Chloride: 105 mmol/L (ref 96–106)
Creatinine, Ser: 1.29 mg/dL — ABNORMAL HIGH (ref 0.76–1.27)
Glucose: 128 mg/dL — ABNORMAL HIGH (ref 70–99)
Potassium: 4.8 mmol/L (ref 3.5–5.2)
Sodium: 144 mmol/L (ref 134–144)
eGFR: 54 mL/min/{1.73_m2} — ABNORMAL LOW (ref >59)

## 2020-12-16 NOTE — Telephone Encounter (Signed)
The patient has been notified of the result and verbalized understanding.  All questions (if any) were answered.  RN did not  change  medication list - since change was only for 2 days. BMP is ordered future- will done 12/23/20 Raiford Simmonds, RN 12/16/2020 9:23 AM

## 2020-12-16 NOTE — Telephone Encounter (Signed)
-----   Message from Loel Dubonnet, NP sent at 12/16/2020  7:25 AM EDT ----- Kidney function decreased from previous.   Recommend reduce Trandolapril to 2mg  for 2 days, increase hydration, then return to 4mg  daily with increased hydration. Repeat BMP in 1 week.   Would additionally be helpful if he is able to provide a log of recent blood pressures.

## 2020-12-23 ENCOUNTER — Other Ambulatory Visit (HOSPITAL_BASED_OUTPATIENT_CLINIC_OR_DEPARTMENT_OTHER): Payer: Self-pay

## 2020-12-23 DIAGNOSIS — R899 Unspecified abnormal finding in specimens from other organs, systems and tissues: Secondary | ICD-10-CM

## 2020-12-23 DIAGNOSIS — I2510 Atherosclerotic heart disease of native coronary artery without angina pectoris: Secondary | ICD-10-CM

## 2020-12-24 LAB — BASIC METABOLIC PANEL
BUN/Creatinine Ratio: 23 (ref 10–24)
BUN: 23 mg/dL (ref 8–27)
CO2: 25 mmol/L (ref 20–29)
Calcium: 9 mg/dL (ref 8.6–10.2)
Chloride: 103 mmol/L (ref 96–106)
Creatinine, Ser: 0.99 mg/dL (ref 0.76–1.27)
Glucose: 124 mg/dL — ABNORMAL HIGH (ref 70–99)
Potassium: 4.6 mmol/L (ref 3.5–5.2)
Sodium: 142 mmol/L (ref 134–144)
eGFR: 75 mL/min/{1.73_m2} (ref >59)

## 2021-02-09 ENCOUNTER — Ambulatory Visit: Payer: Medicare Other | Admitting: Cardiovascular Disease

## 2021-02-09 ENCOUNTER — Encounter: Payer: Self-pay | Admitting: Cardiovascular Disease

## 2021-02-09 ENCOUNTER — Other Ambulatory Visit: Payer: Self-pay

## 2021-02-09 VITALS — BP 132/77 | HR 61 | Ht 70.0 in | Wt 181.4 lb

## 2021-02-09 DIAGNOSIS — I2510 Atherosclerotic heart disease of native coronary artery without angina pectoris: Secondary | ICD-10-CM | POA: Diagnosis not present

## 2021-02-09 DIAGNOSIS — E7800 Pure hypercholesterolemia, unspecified: Secondary | ICD-10-CM | POA: Diagnosis not present

## 2021-02-09 DIAGNOSIS — Z79899 Other long term (current) drug therapy: Secondary | ICD-10-CM | POA: Diagnosis not present

## 2021-02-09 DIAGNOSIS — I4729 Other ventricular tachycardia: Secondary | ICD-10-CM

## 2021-02-09 DIAGNOSIS — I10 Essential (primary) hypertension: Secondary | ICD-10-CM

## 2021-02-09 NOTE — Progress Notes (Signed)
Cardiology Office Note    Date:  02/11/2021   ID:  Michael Bryant, DOB [birth year], MRN [id number]  PCP:  Pcp, No  Cardiologist:   Sanda Klein, MD   Chief Complaint  Patient presents with   Coronary Artery Disease    History of Present Illness:  Michael Bryant is a 87 y.o. male with a remote history of coronary artery disease presenting as cardiac arrest in 2003 and leading to bypass surgery, near-syncope related to nonsustained ventricular tachycardia, no recurrence on Amiodarone, normal left ventricular systolic function by echo (last checked June 2017).  The patient specifically denies any chest pain at rest exertion, dyspnea at rest or with exertion, orthopnea, paroxysmal nocturnal dyspnea, syncope, palpitations, focal neurological deficits, intermittent claudication, lower extremity edema, unexplained weight gain, cough, hemoptysis or wheezing.  Continues to work in the garden and tend beehives without being limited by any cardiac complaints.  He was seen in the emergency room on 09/25/2020 with complaints of palpitations (rapid heartbeat that woke him from sleep, lasting 10 minutes) and some left arm pain (dull, achy, self resolved).  Troponin levels were normal.  ECG was unchanged.  A nuclear stress test performed 11/10/2020 showed old fixed inferior scar, otherwise normal perfusion reported a low EF 45% .  A follow-up echocardiogram performed 12/05/2020 showed normal EF 55-60%.  He was on a very low-dose of amiodarone 100 mg daily, up-to-date with noticeable side effects.  He had a normal TSH 1.680 on 11/28/2020 and normal liver function tests on 05/26/2020.  He was initially seen for emergency CABG 12/16/2001 with a witnessed VF arrest , significant LAD disease with intraluminal thrombus. He underwent LIMA-LAD, VG-2 OM 1, VG-distal RCA.  In 03/2013 he underwent a stress Myoview which was negative for ischemia; EF 60%.  In 06/2013 he had syncope following sexual intercourse. When 911  emergency services arrived was found to have extremely frequent PVCs and NSVT on the monitor.  He underwent cardiac catheterization on 07/02/13 which revealed: "Severe native coronary artery disease, graft dependent. Widely patent bypass grafts. Normal left ventricular systolic function.  Beta blockers have been avoided due to bradycardia, but he tolerates low-dose amiodarone. Has an ILR, but the device reached RRT on 11/20/2016. Event monitor for palpitations in September 2019 showed rare PACs and rare PVCs, normal rhythm during subjective "racing heartbeat".  Past Medical History:  Diagnosis Date   CAD (coronary artery disease)    VF arrest >>  s/p CABG (2003) // Myoview 1/15: no ischemia // Cath 4/15: all grafts patent   HTN (hypertension)    Hx of VF arrest in 2003 2003   Hyperlipidemia    NSVT (nonsustained ventricular tachycardia) 06/16/2013   Rx with amiodarone // Echo 6/17: EF 55-60, Gr 1 DD, apical HK    Past Surgical History:  Procedure Laterality Date   CARDIAC CATHETERIZATION  12/15/2001   60% proximal LAD narrowing with active mobile thrombosis w/50% sequential areas,50-60% pro   CORONARY ARTERY BYPASS GRAFT  2004   LIMA to LAD, SVG to OM, SVG to RCA   LEFT HEART CATHETERIZATION WITH CORONARY/GRAFT ANGIOGRAM N/A 07/02/2013   Procedure: LEFT HEART CATHETERIZATION WITH Beatrix Fetters;  Surgeon: Sanda Klein, MD;  Location: [facility];  Service: Cardiovascular;  Laterality: N/A;   NM MYOCAR PERF WALL MOTION  12/09/2009   mod. perfusion defect basal inferior and mid inferior regions c/w  infarct/scar, EF 54%   TONSILLECTOMY AND ADENOIDECTOMY      Current Medications: Outpatient Medications Prior  to Visit  Medication Sig Dispense Refill   amiodarone (PACERONE) 100 MG tablet 50 mg daily.     aspirin EC 81 MG tablet Take 81 mg by mouth daily.     beta carotene w/minerals (OCUVITE) tablet Take 1 tablet by mouth daily.     Cholecalciferol (VITAMIN D) 50 MCG (2000 UT)  CAPS Take 2,000 Units by mouth daily.     CINNAMON PO Take 1 tablet by mouth daily.      DICLOFENAC PO Take 100 mg by mouth daily.      fish oil-omega-3 fatty acids 1000 MG capsule Take 1 g by mouth daily.      GELATIN PO Take 9 g by mouth daily.     Glucosamine 500 MG CAPS daily.     Misc Natural Products (PROSTATE HEALTH) CAPS Take 1 capsule by mouth daily.     omeprazole (PRILOSEC) 10 MG capsule Take 10 mg by mouth daily.     Polyethyl Glycol-Propyl Glycol (SYSTANE OP) Place 1 drop into both eyes daily as needed (dry eyes).     simvastatin (ZOCOR) 20 MG tablet TAKE 1 TABLET(20 MG) BY MOUTH AT BEDTIME 90 tablet 2   tamsulosin (FLOMAX) 0.4 MG CAPS capsule Take 0.4 mg by mouth daily after supper.      trandolapril (MAVIK) 4 MG tablet Take 1 tablet (4 mg total) by mouth daily. 90 tablet 1   amiodarone (PACERONE) 200 MG tablet Take 100 mg by mouth daily.     chlorpheniramine (CHLOR-TRIMETON) 4 MG tablet Take 4 mg by mouth daily as needed (allergies).  (Patient not taking: Reported on 02/09/2021)     nitroGLYCERIN (NITROSTAT) 0.4 MG SL tablet Place 1 tablet (0.4 mg total) under the tongue every 5 (five) minutes as needed for chest pain. (Patient not taking: Reported on 02/09/2021) 25 tablet 4   No facility-administered medications prior to visit.     Allergies:   Codeine   Social History   Socioeconomic History   Marital status: Married    Spouse name: Not on file   Number of children: 2   Years of education: Not on file   Highest education level: Not on file  Occupational History   Occupation: Retired  Tobacco Use   Smoking status: Former    Types: Cigarettes   Smokeless tobacco: Former   Tobacco comments:    Quit Loss adjuster, chartered Use: Never used  Substance and Sexual Activity   Alcohol use: No   Drug use: No   Sexual activity: Not on file  Other Topics Concern   Not on file  Social History Narrative   Lives at home with wife.          Social Determinants of  Health   Financial Resource Strain: Not on file  Food Insecurity: Not on file  Transportation Needs: Not on file  Physical Activity: Not on file  Stress: Not on file  Social Connections: Not on file     Family History:  The patient's family history includes CAD (age of onset: 74) in his father; Cancer (age of onset: 66) in his mother; Heart disease in his father.   ROS:   Please see the history of present illness.    ROS All other systems are reviewed and are negative.   PHYSICAL EXAM:   VS:  BP 132/77 (BP Location: Left Arm, Patient Position: Sitting, Cuff Size: Normal)   Pulse 61   Ht 5' 10"  (1.778 m)  Wt 181 lb 6.4 oz (82.3 kg)   SpO2 98%   BMI 26.03 kg/m     General: Alert, oriented x3, no distress, minimally overweight, appears fit for his age. Head: no evidence of trauma, PERRL, EOMI, no exophtalmos or lid lag, no myxedema, no xanthelasma; normal ears, nose and oropharynx Neck: normal jugular venous pulsations and no hepatojugular reflux; brisk carotid pulses without delay and no carotid bruits Chest: clear to auscultation, no signs of consolidation by percussion or palpation, normal fremitus, symmetrical and full respiratory excursions Cardiovascular: normal position and quality of the apical impulse, regular rhythm, normal first and second heart sounds, no murmurs, rubs or gallops Abdomen: no tenderness or distention, no masses by palpation, no abnormal pulsatility or arterial bruits, normal bowel sounds, no hepatosplenomegaly Extremities: no clubbing, cyanosis or edema; 2+ radial, ulnar and brachial pulses bilaterally; 2+ right femoral, posterior tibial and dorsalis pedis pulses; 2+ left femoral, posterior tibial and dorsalis pedis pulses; no subclavian or femoral bruits Neurological: grossly nonfocal Psych: Normal mood and affect   Wt Readings from Last 3 Encounters:  02/09/21 181 lb 6.4 oz (82.3 kg)  12/01/20 180 lb 12.8 oz (82 kg)  11/10/20 178 lb (80.7 kg)     Studies/Labs Reviewed:  Leane Call 11/10/2020   Findings are consistent with prior myocardial infarction. The study is intermediate risk.   No ST deviation was noted.   LV perfusion is abnormal. There is no evidence of ischemia. There is evidence of infarction. Defect 1: There is a medium defect with moderate reduction in uptake present in the apical to basal inferior and apex location(s) that is fixed. There is abnormal wall motion in the defect area. Consistent with infarction.   Left ventricular function is abnormal. Nuclear stress EF: 45 %. The left ventricular ejection fraction is mildly decreased (45-54%). End diastolic cavity size is normal.   Prior study available for comparison from 03/25/2013. There are changes compared to prior study. The left ventricular ejection fraction has decreased. Echocardiogram 12/05/2020   1. Left ventricular ejection fraction, by estimation, is 55 to 60%. Left  ventricular ejection fraction by 3D volume is 58 %. The left ventricle has  normal function. The left ventricle has no regional wall motion  abnormalities. Left ventricular diastolic   parameters are consistent with Grade I diastolic dysfunction (impaired  relaxation). The average left ventricular global longitudinal strain is  20.2 %. The global longitudinal strain is normal.   2. Right ventricular systolic function is normal. The right ventricular  size is normal. Tricuspid regurgitation signal is inadequate for assessing  PA pressure.   3. The mitral valve is normal in structure. Trivial mitral valve  regurgitation. No evidence of mitral stenosis.   4. The aortic valve is normal in structure. Aortic valve regurgitation is  not visualized. No aortic stenosis is present.   5. The inferior vena cava is normal in size with greater than 50%  respiratory variability, suggesting right atrial pressure of 3 mmHg.   EKG:  EKG is not ordered today.  Personally reviewed the tracing from 09/26/2020  which shows sinus bradycardia with mild first-degree AV block (PR 220 ms and T wave changes, uncorrected QT 472 ms  [facility] BMET    Component Value Date/Time   NA 142 12/23/2020 1442   K 4.6 12/23/2020 1442   CL 103 12/23/2020 1442   CO2 25 12/23/2020 1442   GLUCOSE 124 (H) 12/23/2020 1442   GLUCOSE 107 (H) 09/25/2020 0603   BUN  23 12/23/2020 1442   CREATININE 0.99 12/23/2020 1442   CREATININE 0.96 06/30/2013 1653   CALCIUM 9.0 12/23/2020 1442   EGFR 75 12/23/2020 1442   GFRNONAA >60 09/25/2020 0603   Lipid Panel     Component Value Date/Time   CHOL 122 03/25/2013 0312   TRIG 104 03/25/2013 0312   HDL 52 03/25/2013 0312   CHOLHDL 2.3 03/25/2013 0312   VLDL 21 03/25/2013 0312   LDLCALC 49 03/25/2013 0312   11/28/2020 Cholesterol 142, HDL 54, LDL 6, triglycerides 138 TSH 1.680 ASSESSMENT:    1. Coronary artery disease involving native coronary artery of native heart without angina pectoris   2. NSVT (nonsustained ventricular tachycardia)   3. Hypercholesterolemia   4. On amiodarone therapy   5. Essential hypertension      PLAN:  In order of problems listed above:  CAD S/P CABG: Asymptomatic.  Native coronaries occluded, graft dependent.  No angina.  No evidence of ischemia on recent nuclear stress test.  On aspirin and statin, intolerant to beta-blockers due to bradycardia. NSVT: Episode of rapid palpitations at night that led to emergency room evaluation in July.  No recurrence since then.  VT was associated with syncope several years ago.  During subsequent 3 years of loop recorder monitoring, there was no recurrence of ventricular tachycardia while on amiodarone 100 mg daily.  The loop recorder is no longer active.  He wore an event monitor in September 2020 that did not show any complex ventricular arrhythmia.  Continue amiodarone. HLP: LDL at target less than 70.  Aware of the interaction between simvastatin and amiodarone, but the dose of  simvastatin is low and he has not had any complications in many years of treatment. Amiodarone: He has sinus bradycardia, but no other signs of toxicity.  Reminded that he needs liver and thyroid function tests at least every 6 months and a yearly eye exam, also to avoid excessive sun exposure. HTN: Blood pressure was high when he initially checked then, but normal after a few minutes of rest.    Medication Adjustments/Labs and Tests Ordered: Current medicines are reviewed at length with the patient today.  Concerns regarding medicines are outlined above.  Medication changes, Labs and Tests ordered today are listed in the Patient Instructions below. Patient Instructions  Medication Instructions:  No changes *If you need a refill on your cardiac medications before your next appointment, please call your pharmacy*   Lab Work: None ordered If you have labs (blood work) drawn today and your tests are completely normal, you will receive your results only by: [place] (if you have MyChart) OR A paper copy in the mail If you have any lab test that is abnormal or we need to change your treatment, we will call you to review the results.   Testing/Procedures: None ordered   Follow-Up: At [facility], you and your health needs are our priority.  As part of our continuing mission to provide you with exceptional heart care, we have created designated Provider Care Teams.  These Care Teams include your primary Cardiologist (physician) and Advanced Practice Providers (APPs -  Physician Assistants and Nurse Practitioners) who all work together to provide you with the care you need, when you need it.  We recommend signing up for the patient portal called "MyChart".  Sign up information is provided on this After Visit Summary.  MyChart is used to connect with patients for Virtual Visits (Telemedicine).  Patients are able to view lab/test results, encounter  notes, upcoming appointments, etc.   Non-urgent messages can be sent to your provider as well.   To learn more about what you can do with MyChart, go to [email or web address].    Your next appointment:   12 month(s)  The format for your next appointment:   In Person  Provider:   Sanda Klein, MD      Signed, Sanda Klein, MD  02/11/2021 2:08 PM    [address], [place], [place]  [address] Phone: [phone]; Fax: [phone]

## 2021-02-09 NOTE — Patient Instructions (Signed)

## 2021-02-11 ENCOUNTER — Encounter: Payer: Self-pay | Admitting: Cardiovascular Disease

## 2021-02-27 ENCOUNTER — Telehealth: Payer: Self-pay | Admitting: Cardiovascular Disease

## 2021-02-27 MED ORDER — NITROGLYCERIN 0.4 MG SL SUBL: 0.4000 mg | SUBLINGUAL_TABLET | SUBLINGUAL | 3 refills | Status: AC | PRN

## 2021-02-27 NOTE — Telephone Encounter (Signed)
Refills has been sent to the pharmacy. 

## 2021-02-27 NOTE — Telephone Encounter (Signed)
°*  STAT* If patient is at the pharmacy, call can be transferred to refill team.   1. Which medications need to be refilled? (please list name of each medication and dose if known) nitroGLYCERIN (NITROSTAT) 0.4 MG SL tablet  2. Which pharmacy/location (including street and city if local pharmacy) is medication to be sent to? [id number] - [place], [place] - [address]  3. Do they need a 30 day or 90 day supply? [place]

## 2021-02-28 ENCOUNTER — Other Ambulatory Visit: Payer: Self-pay | Admitting: Cardiovascular Disease

## 2021-05-27 ENCOUNTER — Other Ambulatory Visit (HOSPITAL_BASED_OUTPATIENT_CLINIC_OR_DEPARTMENT_OTHER): Payer: Self-pay | Admitting: Family

## 2021-05-27 DIAGNOSIS — I10 Essential (primary) hypertension: Secondary | ICD-10-CM

## 2021-05-29 NOTE — Telephone Encounter (Signed)
Rx(s) sent to pharmacy electronically.  

## 2021-11-24 ENCOUNTER — Other Ambulatory Visit: Payer: Self-pay

## 2021-11-24 DIAGNOSIS — I10 Essential (primary) hypertension: Secondary | ICD-10-CM

## 2021-11-24 MED ORDER — TRANDOLAPRIL 4 MG PO TABS: ORAL_TABLET | ORAL | 0 refills | Status: DC

## 2022-01-23 ENCOUNTER — Telehealth: Payer: Self-pay | Admitting: Cardiovascular Disease

## 2022-01-23 NOTE — Telephone Encounter (Signed)
Spoke with pt regarding his heart acting up. Pt reports calling EMS this morning after waking up at 6am feeling hot and with heart beating hard/fast. Pt states that before calling EMS he took 2 SL nitroglycerins about 15-11mns apart. When he spoke to EMS they recommended that he take 4- '81mg'$  aspirins. When EMS arrived they did an EKG that was "normal".  Pt does state that this felt similar to when he has SVT. Pt reports that this is happening about once a month now but today felt different and that is why he called EMS. Pt states that he has not changed diets and feels hydrated. No recent medication changes. Office visit scheduled with ERaquel Sarna NP to discuss. Will forward to Dr. CSallyanne Kusterto further advise.

## 2022-01-23 NOTE — Telephone Encounter (Signed)
  Pt said, paramedics were called because his heart was acting up again, he was told to call Dr. Loletha Grayer, he said, he might need to get another loop recorder to monitor his heart

## 2022-01-23 NOTE — Telephone Encounter (Signed)
Agree w office appt. Needs a minimum of an arrhythmia monitor. Consider replacing loop recorder if he has near-syncope or syncope.

## 2022-01-29 ENCOUNTER — Other Ambulatory Visit (INDEPENDENT_AMBULATORY_CARE_PROVIDER_SITE_OTHER): Payer: Medicare Other

## 2022-01-29 ENCOUNTER — Other Ambulatory Visit: Payer: Self-pay | Admitting: Nurse Practitioner

## 2022-01-29 ENCOUNTER — Ambulatory Visit: Payer: Medicare Other | Attending: Nurse Practitioner | Admitting: Nurse Practitioner

## 2022-01-29 ENCOUNTER — Other Ambulatory Visit: Payer: Self-pay

## 2022-01-29 ENCOUNTER — Encounter: Payer: Self-pay | Admitting: Nurse Practitioner

## 2022-01-29 VITALS — BP 162/82 | HR 60 | Ht 70.0 in | Wt 182.6 lb

## 2022-01-29 DIAGNOSIS — Z8679 Personal history of other diseases of the circulatory system: Secondary | ICD-10-CM

## 2022-01-29 DIAGNOSIS — R002 Palpitations: Secondary | ICD-10-CM

## 2022-01-29 DIAGNOSIS — E785 Hyperlipidemia, unspecified: Secondary | ICD-10-CM

## 2022-01-29 DIAGNOSIS — I25118 Atherosclerotic heart disease of native coronary artery with other forms of angina pectoris: Secondary | ICD-10-CM

## 2022-01-29 DIAGNOSIS — I4729 Other ventricular tachycardia: Secondary | ICD-10-CM

## 2022-01-29 DIAGNOSIS — I10 Essential (primary) hypertension: Secondary | ICD-10-CM

## 2022-01-29 MED ORDER — VALSARTAN 160 MG PO TABS: 160.0000 mg | ORAL_TABLET | Freq: Every day | ORAL | 3 refills | Status: DC

## 2022-01-29 NOTE — Progress Notes (Signed)
Office Visit    Patient Name: Michael Bryant Date of Encounter: 01/29/2022  Primary Care Provider:  Pcp, No Primary Cardiologist:  Sanda Klein, MD  Chief Complaint    88 year old male with a history of VF arrest, CAD s/p CABG x3 in 2003, NSVT, hypertension, and hyperlipidemia who presents for follow-up related  to palpitations.  Past Medical History    Past Medical History:  Diagnosis Date   CAD (coronary artery disease)    VF arrest >>  s/p CABG (2003) // Myoview 1/15: no ischemia // Cath 4/15: all grafts patent   HTN (hypertension)    Hx of VF arrest in 2003 2003   Hyperlipidemia    NSVT (nonsustained ventricular tachycardia) ([place]) 06/16/2013   Rx with amiodarone // Echo 6/17: EF 55-60, Gr 1 DD, apical HK   Past Surgical History:  Procedure Laterality Date   CARDIAC CATHETERIZATION  12/15/2001   60% proximal LAD narrowing with active mobile thrombosis w/50% sequential areas,50-60% pro   CORONARY ARTERY BYPASS GRAFT  2004   LIMA to LAD, SVG to OM, SVG to RCA   LEFT HEART CATHETERIZATION WITH CORONARY/GRAFT ANGIOGRAM N/A 07/02/2013   Procedure: LEFT HEART CATHETERIZATION WITH Beatrix Fetters;  Surgeon: Sanda Klein, MD;  Location: [place] [facility];  Service: Cardiovascular;  Laterality: N/A;   NM MYOCAR PERF WALL MOTION  12/09/2009   mod. perfusion defect basal inferior and mid inferior regions c/w  infarct/scar, EF 54%   TONSILLECTOMY AND ADENOIDECTOMY      Allergies  Allergies  Allergen Reactions   Codeine Other (See Comments) and Hives    "wont go to sleep"    History of Present Illness    88 year old male with the above past medical history including  VF arrest, CAD s/p CABG x3 in 2003, NSVT, hypertension, and hyperlipidemia.  He was hospitalized in 12/2001 in the setting of V-fib arrest, significant LAD disease with intraluminal thrombus.  He underwent CABG x3 (LIMA-LAD, SVG-OM1, SVG-RCA).  Stress Myoview in 2015 was negative for ischemia.  In 2015 he  had a syncopal episode following sexual intercourse.  When EMS arrived he was found to have extremely frequent PVCs, NSVT.  Repeat cardiac catheterization 06/2013 revealed severe native coronary artery disease, graft, dependent, widely patent bypass grafts, normal LV systolic function.  He has not tolerated beta-blockers due to bradycardia.  He has been maintained on low-dose amiodarone.  ILR was placed but reached RRT in 11/2016.  Cardiac monitor in September 2019 the setting of palpitations showed rare PACs, rare PVCs, normal rhythm during subjective "racing heartbeat."  He was seen in the ED on 09/2020 with complaints of palpitations, left arm pain.  Troponin was negative.  EKG was unchanged.  Nuclear stress test in 11/2020 showed old fixed inferior scar, otherwise normal perfusion, EF 45%.  Follow-up echocardiogram showed EF 55 to 60%.  He was last seen in the office on 02/09/2021 and was stable from a cardiac standpoint.  He denies any recurrent palpitations.  He contacted EMS on 01/23/2022 in the setting of recurrent palpitations.  He reported intermittent palpitations for over a month.  Per patient EKG at the time was normal.  Outpatient follow-up was advised for consideration of possible cardiac monitor versus replacement of loop recorder.  He presents today for follow-up accompanied by his wife and daughter-in-law.. Since his last visit his been stable overall though he notes a 47-monthhistory of intermittent palpitations that have become more frequent over the last month.  He notes palpitations approximately  2-3 times a week-they can last for hours at a time.  He states he feels "hot" and as if his heart is racing.  BP has also been elevated.  He denies chest pain, dyspnea, dizziness, presyncope, syncope.  Other than his recent palpitations and elevated BP, he denies any additional concerns today.  Home Medications    Current Outpatient Medications  Medication Sig Dispense Refill   amiodarone  (PACERONE) 100 MG tablet 50 mg daily.     amiodarone (PACERONE) 200 MG tablet TAKE 1/2 TABLET(100 MG) BY MOUTH DAILY 45 tablet 3   aspirin EC 81 MG tablet Take 81 mg by mouth daily.     beta carotene w/minerals (OCUVITE) tablet Take 1 tablet by mouth daily.     chlorpheniramine (CHLOR-TRIMETON) 4 MG tablet Take 4 mg by mouth daily as needed (allergies).     Cholecalciferol (VITAMIN D) 50 MCG (2000 UT) CAPS Take 2,000 Units by mouth daily.     CINNAMON PO Take 1 tablet by mouth daily.      DICLOFENAC PO Take 100 mg by mouth daily.      fish oil-omega-3 fatty acids 1000 MG capsule Take 1 g by mouth daily.      GELATIN PO Take 9 g by mouth daily.     Glucosamine 500 MG CAPS daily.     Misc Natural Products (PROSTATE HEALTH) CAPS Take 1 capsule by mouth daily.     nitroGLYCERIN (NITROSTAT) 0.4 MG SL tablet Place 1 tablet (0.4 mg total) under the tongue every 5 (five) minutes as needed for chest pain. 75 tablet 3   omeprazole (PRILOSEC) 10 MG capsule Take 10 mg by mouth daily.     Polyethyl Glycol-Propyl Glycol (SYSTANE OP) Place 1 drop into both eyes daily as needed (dry eyes).     simvastatin (ZOCOR) 20 MG tablet TAKE 1 TABLET(20 MG) BY MOUTH AT BEDTIME 90 tablet 3   tamsulosin (FLOMAX) 0.4 MG CAPS capsule Take 0.4 mg by mouth daily after supper.      valsartan (DIOVAN) 160 MG tablet Take 1 tablet (160 mg total) by mouth daily. 90 tablet 3   No current facility-administered medications for this visit.     Review of Systems    He denies chest pain, dyspnea, pnd, orthopnea, n, v, dizziness, syncope, edema, weight gain, or early satiety. All other systems reviewed and are otherwise negative except as noted above.   Physical Exam    VS:  BP (!) 162/82   Pulse 60   Ht '5\' 10"'$  (1.778 m)   Wt 182 lb 9.6 oz (82.8 kg)   SpO2 95%   BMI 26.20 kg/m  GEN: Well nourished, well developed, in no acute distress. HEENT: normal. Neck: Supple, no JVD, carotid bruits, or masses. Cardiac: RRR, no  murmurs, rubs, or gallops. No clubbing, cyanosis, edema.  Radials/DP/PT 2+ and equal bilaterally.  Respiratory:  Respirations regular and unlabored, clear to auscultation bilaterally. GI: Soft, nontender, nondistended, BS + x 4. MS: no deformity or atrophy. Skin: warm and dry, no rash. Neuro:  Strength and sensation are intact. Psych: Normal affect.  Accessory Clinical Findings    ECG personally reviewed by me today -sinus rhythm, 60 bpm, first-degree AV block- no acute changes.   Lab Results  Component Value Date   WBC 6.8 09/25/2020   HGB 14.4 09/25/2020   HCT 44.1 09/25/2020   MCV 95.5 09/25/2020   PLT 205 09/25/2020   Lab Results  Component Value Date   CREATININE 0.99  12/23/2020   BUN 23 12/23/2020   NA 142 12/23/2020   K 4.6 12/23/2020   CL 103 12/23/2020   CO2 25 12/23/2020   No results found for: "ALT", "AST", "GGT", "ALKPHOS", "BILITOT" Lab Results  Component Value Date   CHOL 122 03/25/2013   HDL 52 03/25/2013   LDLCALC 49 03/25/2013   TRIG 104 03/25/2013   CHOLHDL 2.3 03/25/2013    Lab Results  Component Value Date   HGBA1C (H) 07/08/2009    5.8 (NOTE)                                                                       According to the ADA Clinical Practice Recommendations for 2011, when HbA1c is used as a screening test:   >=6.5%   Diagnostic of Diabetes Mellitus           (if abnormal result  is confirmed)  5.7-6.4%   Increased risk of developing Diabetes Mellitus  References:Diagnosis and Classification of Diabetes Mellitus,Diabetes DJSH,7026,37(CHYIF 1):S62-S69 and Standards of Medical Care in         Diabetes - 2011,Diabetes Care,2011,34  (Suppl 1):S11-S61.    Assessment & Plan   1. Palpitations/NSVT: He notes a 35-monthhistory of intermittent palpitations, they have increased in frequency over the past month.  He now notes palpitations approximately 2-3 times a week that can last for hours at a time.  He reports feeling "hot" and as if his heart is  racing.  Denies any other associated symptoms. ILR reached RRT in 2018.  Will plan to pursue 14-day live ZIO monitor.  However, will discuss with Dr. CSallyanne Kusterand if MD prefers, patient states he is willing to proceed with replacement of loop recorder.  Discussed ED precautions.  Continue amiodarone.  He is due for his annual eye exam and chest x-ray (will order).  Will check CMET, TSH, Mg.  No beta-blocker in the setting of baseline bradycardia.  Continue low dose amiodarone.  2. History of V-fib arrest/CAD:  S/p CABG x3 (LIMA-LAD, SVG-OM1, SVG-RC) in 12/2001.  Cath in 2015 revealed patent grafts.  Stable with no anginal symptoms. No indication for ischemic evaluation.  Continue aspirin, simvastatin.  3. Hypertension: BP elevated above goal in office today.  Will stop trandolapril and start valsartan 160 mg daily.  Will check BMET in 2 weeks.  Continue to monitor BP, goal BP < 140/80.   4. Hyperlipidemia: LDL was 64 in 05/2020.  Monitored and managed per PCP.  Continue aspirin, simvastatin.  5. Disposition: Follow-up in 6 to 8 weeks.  HYPERTENSION CONTROL Vitals:   01/29/22 0845 01/29/22 0926  BP: (!) 164/74 (!) 162/82    The patient's blood pressure is elevated above target today.  In order to address the patient's elevated BP: A new medication was prescribed today.; Follow up with general cardiology has been recommended.      ELenna Sciara NP 01/29/2022, 10:10 AM

## 2022-01-29 NOTE — Patient Instructions (Addendum)
Medication Instructions:  START Valsartan 160 mg daily STOP Trandolapril as directed  *If you need a refill on your cardiac medications before your next appointment, please call your pharmacy*   Lab Work: Your physician recommends that you complete lab work today and return for lab work in 2 weeks.  CMET, TSH, Magnesium (today) BMET (2 weeks)  If you have labs (blood work) drawn today and your tests are completely normal, you will receive your results only by: [place] (if you have MyChart) OR A paper copy in the mail If you have any lab test that is abnormal or we need to change your treatment, we will call you to review the results.   Testing/Procedures: Chest XRAY  ZIO AT Long term monitor-Live Telemetry  Your physician has requested you wear a ZIO patch monitor for 14 days.  This is a single patch monitor. [employer] supplies one patch monitor per enrollment. Additional  stickers are not available.  Please do not apply patch if you will be having a Nuclear Stress Test, Echocardiogram, Cardiac CT, MRI,  or Chest Xray during the period you would be wearing the monitor. The patch cannot be worn during  these tests. You cannot remove and re-apply the ZIO AT patch monitor.  Your ZIO patch monitor will be mailed 3 day USPS to your address on file. It may take 3-5 days to  receive your monitor after you have been enrolled.  Once you have received your monitor, please review the enclosed instructions. Your monitor has  already been registered assigning a specific monitor serial # to you.   Billing and Patient Assistance Program information  Theodore Demark has been supplied with any insurance information on record for billing. [employer] offers a sliding scale Patient Assistance Program for patients without insurance, or whose  insurance does not completely cover the cost of the ZIO patch monitor. You must apply for the  Patient Assistance Program to qualify for the discounted rate. To  apply, call [employer] at [phone],  select option 4, select option 2 , ask to apply for the Patient Assistance Program, (you can request an  interpreter if needed). [employer] will ask your household income and how many people are in your  household. [employer] will quote your out-of-pocket cost based on this information. They will also be able  to set up a 12 month interest free payment plan if needed.  Applying the monitor   Shave hair from upper left chest.  Hold the abrader disc by orange tab. Rub the abrader in 40 strokes over left upper chest as indicated in  your monitor instructions.  Clean area with 4 enclosed alcohol pads. Use all pads to ensure the area is cleaned thoroughly. Let  dry.  Apply patch as indicated in monitor instructions. Patch will be placed under collarbone on left side of  chest with arrow pointing upward.  Rub patch adhesive wings for 2 minutes. Remove the white label marked "1". Remove the white label  marked "2". Rub patch adhesive wings for 2 additional minutes.  While looking in a mirror, press and release button in center of patch. A small green light will flash 3-4  times. This will be your only indicator that the monitor has been turned on.  Do not shower for the first 24 hours. You may shower after the first 24 hours.  Press the button if you feel a symptom. You will hear a small click. Record Date, Time and Symptom in  the Patient Log.  Starting the Gateway  In your kit there is a Hydrographic surveyor box the size of a cellphone. This is Airline pilot. It transmits all your  recorded data to [facility]. This box must always stay within 10 feet of you. Open the box and push the *  button. There will be a light that blinks orange and then green a few times. When the light stops  blinking, the Gateway is connected to the ZIO patch. Call [employer] at [phone] to confirm your monitor is transmitting.  Returning your monitor  Remove your patch and place it inside  the [place]. In the lower half of the Gateway there is a white  bag with prepaid postage on it. Place Gateway in bag and seal. Mail package back to [place] as soon as  possible. Your physician should have your final report approximately 7 days after you have mailed back  your monitor. Call [place] at [phone] if you have questions regarding your ZIO AT  patch monitor. Call them immediately if you see an orange light blinking on your monitor.  If your monitor falls off in less than 4 days, contact our Monitor department at [phone]. If your  monitor becomes loose or falls off after 4 days call [employer] at [phone] for suggestions on  securing your monitor   Follow-Up: At [facility], you and your health needs are our priority.  As part of our continuing mission to provide you with exceptional heart care, we have created designated Provider Care Teams.  These Care Teams include your primary Cardiologist (physician) and Advanced Practice Providers (APPs -  Physician Assistants and Nurse Practitioners) who all work together to provide you with the care you need, when you need it.  We recommend signing up for the patient portal called "MyChart".  Sign up information is provided on this After Visit Summary.  MyChart is used to connect with patients for Virtual Visits (Telemedicine).  Patients are able to view lab/test results, encounter notes, upcoming appointments, etc.  Non-urgent messages can be sent to your provider as well.   To learn more about what you can do with MyChart, go to [email or web address].    Your next appointment:   6-8 week(s)  The format for your next appointment:   In Person  Provider:   Sanda Klein, MD  or Diona Browner, NP        Other Instructions Monitor Blood pressure   Important Information About Sugar

## 2022-01-29 NOTE — Progress Notes (Unsigned)
Enrolled for [employer] to mail a ZIO AT Live Telemetry monitor to patients address on file.   Dr. Sallyanne Kuster to read.

## 2022-01-30 LAB — COMPREHENSIVE METABOLIC PANEL
ALT: 16 IU/L (ref 0–44)
AST: 17 IU/L (ref 0–40)
Albumin/Globulin Ratio: 2 (ref 1.2–2.2)
Albumin: 4.3 g/dL (ref 3.7–4.7)
Alkaline Phosphatase: 64 IU/L (ref 44–121)
BUN/Creatinine Ratio: 16 (ref 10–24)
BUN: 16 mg/dL (ref 8–27)
Bilirubin Total: 0.7 mg/dL (ref 0.0–1.2)
CO2: 24 mmol/L (ref 20–29)
Calcium: 9.6 mg/dL (ref 8.6–10.2)
Chloride: 101 mmol/L (ref 96–106)
Creatinine, Ser: 1 mg/dL (ref 0.76–1.27)
Globulin, Total: 2.1 g/dL (ref 1.5–4.5)
Glucose: 179 mg/dL — ABNORMAL HIGH (ref 70–99)
Potassium: 4.3 mmol/L (ref 3.5–5.2)
Sodium: 140 mmol/L (ref 134–144)
Total Protein: 6.4 g/dL (ref 6.0–8.5)
eGFR: 73 mL/min/{1.73_m2} (ref >59)

## 2022-01-30 LAB — MAGNESIUM: Magnesium: 2.1 mg/dL (ref 1.6–2.3)

## 2022-01-30 LAB — TSH: TSH: 2.26 u[IU]/mL (ref 0.450–4.500)

## 2022-01-31 ENCOUNTER — Ambulatory Visit
Admission: RE | Admit: 2022-01-31 | Discharge: 2022-01-31 | Disposition: A | Discharge status: Home / Self Care | Payer: Medicare Other | Source: Ambulatory Visit | Attending: Nurse Practitioner | Admitting: Nurse Practitioner

## 2022-01-31 DIAGNOSIS — Z8679 Personal history of other diseases of the circulatory system: Secondary | ICD-10-CM

## 2022-01-31 DIAGNOSIS — R002 Palpitations: Secondary | ICD-10-CM | POA: Diagnosis not present

## 2022-01-31 DIAGNOSIS — I4729 Other ventricular tachycardia: Secondary | ICD-10-CM

## 2022-02-06 ENCOUNTER — Telehealth: Payer: Self-pay

## 2022-02-06 NOTE — Telephone Encounter (Signed)
Lab results have been reviewed via mychart. Lmom, pt call call back with any questions or concerns.

## 2022-02-13 ENCOUNTER — Telehealth: Payer: Self-pay

## 2022-02-13 LAB — BASIC METABOLIC PANEL
BUN/Creatinine Ratio: 16 (ref 10–24)
BUN: 18 mg/dL (ref 8–27)
CO2: 28 mmol/L (ref 20–29)
Calcium: 9.4 mg/dL (ref 8.6–10.2)
Chloride: 102 mmol/L (ref 96–106)
Creatinine, Ser: 1.14 mg/dL (ref 0.76–1.27)
Glucose: 129 mg/dL — ABNORMAL HIGH (ref 70–99)
Potassium: 4.9 mmol/L (ref 3.5–5.2)
Sodium: 142 mmol/L (ref 134–144)
eGFR: 63 mL/min/{1.73_m2} (ref >59)

## 2022-02-13 NOTE — Telephone Encounter (Signed)
Spoke with pt. Pt was notified of Xray results. Pt will continue his current medications and follow up as planned.

## 2022-02-21 ENCOUNTER — Other Ambulatory Visit: Payer: Self-pay | Admitting: Cardiovascular Disease

## 2022-02-21 DIAGNOSIS — I10 Essential (primary) hypertension: Secondary | ICD-10-CM

## 2022-02-27 ENCOUNTER — Telehealth: Payer: Self-pay | Admitting: Cardiovascular Disease

## 2022-02-27 NOTE — Telephone Encounter (Signed)
Pt c/o BP issue: STAT if pt c/o blurred vision, one-sided weakness or slurred speech  1. What are your last 5 BP readings? 145-165/states the bottom number is normal  2. Are you having any other symptoms (ex. Dizziness, headache, blurred vision, passed out)? dizzniess  3. What is your BP issue? Its too high

## 2022-02-27 NOTE — Telephone Encounter (Signed)
Patient recent appt with Raquel Sarna  on 01/29/22   Per patient wife  blood pressure reading has been   02/19/22 147/65  at 9:15 am  02/21/12  147/80 at  9:30 am 02/22/22 159/82 at 10:15 am 02/23/22 155/8 at  9:45 am 02/24/22  166/86 at 11:am 02/25/22 163/77  at 10 am   Wife is  will forward to [facility]  for review and  response

## 2022-02-28 MED ORDER — CHLORTHALIDONE 25 MG PO TABS: 12.5000 mg | ORAL_TABLET | Freq: Every day | ORAL | 3 refills | Status: DC

## 2022-02-28 NOTE — Telephone Encounter (Signed)
Lmom to discuss recommendations from Diona Browner NP s/p pts call. Chlorthalidone 12.5 mg was sent into pts pharmacy.

## 2022-02-28 NOTE — Telephone Encounter (Signed)
Pt is returning call. Requesting return call.  

## 2022-02-28 NOTE — Telephone Encounter (Signed)
Spoke with pt and he will start chlorthalidone 12.5 mg, monitor his BP and bring BP log to his next apt.

## 2022-02-28 NOTE — Telephone Encounter (Signed)
Called pt back and was placed on hold for 7 mins. Will call pt back.

## 2022-03-01 ENCOUNTER — Other Ambulatory Visit: Payer: Self-pay

## 2022-03-01 MED ORDER — SIMVASTATIN 20 MG PO TABS: 20.0000 mg | ORAL_TABLET | Freq: Every day | ORAL | 3 refills | Status: DC

## 2022-03-14 ENCOUNTER — Ambulatory Visit: Payer: Medicare Other | Admitting: Nurse Practitioner

## 2022-04-02 ENCOUNTER — Encounter: Payer: Self-pay | Admitting: Cardiovascular Disease

## 2022-04-02 ENCOUNTER — Ambulatory Visit: Payer: Medicare Other | Attending: Cardiovascular Disease | Admitting: Cardiovascular Disease

## 2022-04-02 VITALS — BP 120/62 | HR 60 | Ht 70.0 in | Wt 183.0 lb

## 2022-04-02 DIAGNOSIS — E7800 Pure hypercholesterolemia, unspecified: Secondary | ICD-10-CM

## 2022-04-02 DIAGNOSIS — R002 Palpitations: Secondary | ICD-10-CM | POA: Diagnosis not present

## 2022-04-02 DIAGNOSIS — I10 Essential (primary) hypertension: Secondary | ICD-10-CM

## 2022-04-02 DIAGNOSIS — I2510 Atherosclerotic heart disease of native coronary artery without angina pectoris: Secondary | ICD-10-CM

## 2022-04-02 DIAGNOSIS — I4729 Other ventricular tachycardia: Secondary | ICD-10-CM | POA: Diagnosis not present

## 2022-04-02 DIAGNOSIS — Z79899 Other long term (current) drug therapy: Secondary | ICD-10-CM

## 2022-04-02 MED ORDER — CHLORTHALIDONE 25 MG PO TABS: 25.0000 mg | ORAL_TABLET | Freq: Every day | ORAL | 3 refills | Status: DC

## 2022-04-02 NOTE — Patient Instructions (Signed)
Medication Instructions:  Chlorthalidone '25mg'$  daily *If you need a refill on your cardiac medications before your next appointment, please call your pharmacy*  Follow-Up: At [facility], you and your health needs are our priority.  As part of our continuing mission to provide you with exceptional heart care, we have created designated Provider Care Teams.  These Care Teams include your primary Cardiologist (physician) and Advanced Practice Providers (APPs -  Physician Assistants and Nurse Practitioners) who all work together to provide you with the care you need, when you need it.  We recommend signing up for the patient portal called "MyChart".  Sign up information is provided on this After Visit Summary.  MyChart is used to connect with patients for Virtual Visits (Telemedicine).  Patients are able to view lab/test results, encounter notes, upcoming appointments, etc.  Non-urgent messages can be sent to your provider as well.   To learn more about what you can do with MyChart, go to [email or web address].    Your next appointment:   1 year(s)  Provider:   Sanda Klein, MD

## 2022-04-05 ENCOUNTER — Encounter: Payer: Self-pay | Admitting: Cardiovascular Disease

## 2022-04-05 ENCOUNTER — Other Ambulatory Visit: Payer: Self-pay | Admitting: Cardiovascular Disease

## 2022-04-05 NOTE — Progress Notes (Signed)
Cardiology Office Note    Date:  04/05/2022   ID:  Michael Bryant, DOB 1933/11/29, MRN [id number]  PCP:  Pcp, No  Cardiologist:   Sanda Klein, MD   Chief Complaint  Patient presents with   Coronary Artery Disease   Irregular Heart Beat    History of Present Illness:  Michael Bryant is a 89 y.o. male with a remote history of coronary artery disease presenting as cardiac arrest in 2003 and leading to bypass surgery, near-syncope related to nonsustained ventricular tachycardia, no recurrence on Amiodarone, normal left ventricular systolic function by echo (last checked September 2022).  Recently he has been doing quite well.  Continues to work in his garden and 10 his beehives without limitations. The patient specifically denies any chest pain at rest exertion, dyspnea at rest or with exertion, orthopnea, paroxysmal nocturnal dyspnea, syncope, palpitations, focal neurological deficits, intermittent claudication, lower extremity edema, unexplained weight gain, cough, hemoptysis or wheezing.  He is much more active than the average 89 year old.  He lost the last of his 13 siblings and is now the only one left.  There have been some changes in his medications and is no longer taking trandolapril but is instead on valsartan he has been taking a full 25 mg tablet of chlorthalidone rather than the prescribed half.  His blood pressure has been well-controlled and he has not had issues with weakness or dizziness.  Last November he had a spell of rapid palpitations associated with flushing.  By the time EMS arrived they found the normal electrocardiogram.  He reported that he has been having similar palpitations about once a month.  We subsequently had him wear an event monitor that showed normal sinus rhythm with rare isolated PACs or PVCs, without evidence of atrial fibrillation, ventricular tachycardia or other serious arrhythmia.  Not long before in September 2022 he had a repeat echocardiogram that  showed normal left ventricular systolic function (EF 85-63%) and a repeat nuclear stress test that showed evidence of an old inferior infarction without reversible ischemia and ejection fraction of 45%.  He was seen in the emergency room on 09/25/2020 with complaints of palpitations (rapid heartbeat that woke him from sleep, lasting 10 minutes) and some left arm pain (dull, achy, self resolved).  Troponin levels were normal.  ECG was unchanged.  A nuclear stress test performed 11/10/2020 showed old fixed inferior scar, otherwise normal perfusion reported a low EF 45% .  A follow-up echocardiogram performed 12/05/2020 showed normal EF 55-60%.  He was on a very low-dose of amiodarone 100 mg daily, up-to-date with noticeable side effects.  He had a normal TSH 1.680 on 11/28/2020 and normal liver function tests on 05/26/2020.  He was initially seen for emergency CABG 12/16/2001 with a witnessed VF arrest , significant LAD disease with intraluminal thrombus. He underwent LIMA-LAD, VG-2 OM 1, VG-distal RCA.  In 03/2013 he underwent a stress Myoview which was negative for ischemia; EF 60%.  In 06/2013 he had syncope following sexual intercourse. When 911 emergency services arrived was found to have extremely frequent PVCs and NSVT on the monitor.  He underwent cardiac catheterization on 07/02/13 which revealed: "Severe native coronary artery disease, graft dependent. Widely patent bypass grafts. Normal left ventricular systolic function.  Beta blockers have been avoided due to bradycardia, but he tolerates low-dose amiodarone. Had an ILR, but the device reached RRT on 11/20/2016. Event monitor for palpitations in September 2019 showed rare PACs and rare PVCs, normal rhythm during subjective "racing  heartbeat".  Similarly, an event monitor in November 2023 showed only isolated PACs and PVCs.  Nuclear stress test in September 2022 showed old inferior infarction with EF 45%, but the echo showed normal LV regional wall  motion and EF 55 to 60%.  Past Medical History:  Diagnosis Date   CAD (coronary artery disease)    VF arrest >>  s/p CABG (2003) // Myoview 1/15: no ischemia // Cath 4/15: all grafts patent   HTN (hypertension)    Hx of VF arrest in 2003 2003   Hyperlipidemia    NSVT (nonsustained ventricular tachycardia) ([place]) 06/16/2013   Rx with amiodarone // Echo 6/17: EF 55-60, Gr 1 DD, apical HK    Past Surgical History:  Procedure Laterality Date   CARDIAC CATHETERIZATION  12/15/2001   60% proximal LAD narrowing with active mobile thrombosis w/50% sequential areas,50-60% pro   CORONARY ARTERY BYPASS GRAFT  2004   LIMA to LAD, SVG to OM, SVG to RCA   LEFT HEART CATHETERIZATION WITH CORONARY/GRAFT ANGIOGRAM N/A 07/02/2013   Procedure: LEFT HEART CATHETERIZATION WITH Beatrix Fetters;  Surgeon: Sanda Klein, MD;  Location: [facility] CATH [facility];  Service: Cardiovascular;  Laterality: N/A;   NM MYOCAR PERF WALL MOTION  12/09/2009   mod. perfusion defect basal inferior and mid inferior regions c/w  infarct/scar, EF 54%   TONSILLECTOMY AND ADENOIDECTOMY      Current Medications: Outpatient Medications Prior to Visit  Medication Sig Dispense Refill   amiodarone (PACERONE) 100 MG tablet 50 mg daily.     amiodarone (PACERONE) 200 MG tablet TAKE 1/2 TABLET(100 MG) BY MOUTH DAILY 45 tablet 3   aspirin EC 81 MG tablet Take 81 mg by mouth daily.     beta carotene w/minerals (OCUVITE) tablet Take 1 tablet by mouth daily.     chlorpheniramine (CHLOR-TRIMETON) 4 MG tablet Take 4 mg by mouth daily as needed (allergies).     Cholecalciferol (VITAMIN D) 50 MCG (2000 UT) CAPS Take 2,000 Units by mouth daily.     CINNAMON PO Take 1 tablet by mouth daily.      DICLOFENAC PO Take 100 mg by mouth daily.      fish oil-omega-3 fatty acids 1000 MG capsule Take 1 g by mouth daily.      GELATIN PO Take 9 g by mouth daily.     Glucosamine 500 MG CAPS daily.     Misc Natural Products (PROSTATE HEALTH) CAPS Take 1  capsule by mouth daily.     nitroGLYCERIN (NITROSTAT) 0.4 MG SL tablet Place 1 tablet (0.4 mg total) under the tongue every 5 (five) minutes as needed for chest pain. 75 tablet 3   omeprazole (PRILOSEC) 10 MG capsule Take 10 mg by mouth daily.     Polyethyl Glycol-Propyl Glycol (SYSTANE OP) Place 1 drop into both eyes daily as needed (dry eyes).     simvastatin (ZOCOR) 20 MG tablet Take 1 tablet (20 mg total) by mouth daily at 6 PM. 90 tablet 3   tamsulosin (FLOMAX) 0.4 MG CAPS capsule Take 0.4 mg by mouth daily after supper.      valsartan (DIOVAN) 160 MG tablet Take 1 tablet (160 mg total) by mouth daily. 90 tablet 3   chlorthalidone (HYGROTON) 25 MG tablet Take 0.5 tablets (12.5 mg total) by mouth daily. 90 tablet 3   trandolapril (MAVIK) 4 MG tablet TAKE 1 TABLET(4 MG) BY MOUTH DAILY 90 tablet 0   No facility-administered medications prior to visit.     Allergies:  Codeine   Social History   Socioeconomic History   Marital status: Married    Spouse name: Not on file   Number of children: 2   Years of education: Not on file   Highest education level: Not on file  Occupational History   Occupation: Retired  Tobacco Use   Smoking status: Former    Types: Cigarettes   Smokeless tobacco: Former   Tobacco comments:    Quit Loss adjuster, chartered Use: Never used  Substance and Sexual Activity   Alcohol use: No   Drug use: No   Sexual activity: Not on file  Other Topics Concern   Not on file  Social History Narrative   Lives at home with wife.          Social Determinants of Health   Financial Resource Strain: Not on file  Food Insecurity: Not on file  Transportation Needs: Not on file  Physical Activity: Not on file  Stress: Not on file  Social Connections: Not on file     Family History:  The patient's family history includes CAD (age of onset: 69) in his father; Cancer (age of onset: 35) in his mother; Heart disease in his father.   ROS:   Please see the  history of present illness.    ROS All other systems are reviewed and are negative.   PHYSICAL EXAM:   VS:  BP 120/62   Pulse 60   Ht '5\' 10"'$  (1.778 m)   Wt 183 lb (83 kg)   SpO2 97%   BMI 26.26 kg/m      General: Alert, oriented x3, no distress, appears very fit for his age. Head: no evidence of trauma, PERRL, EOMI, no exophtalmos or lid lag, no myxedema, no xanthelasma; normal ears, nose and oropharynx Neck: normal jugular venous pulsations and no hepatojugular reflux; brisk carotid pulses without delay and no carotid bruits Chest: clear to auscultation, no signs of consolidation by percussion or palpation, normal fremitus, symmetrical and full respiratory excursions Cardiovascular: normal position and quality of the apical impulse, regular rhythm, normal first and second heart sounds, no murmurs, rubs or gallops Abdomen: no tenderness or distention, no masses by palpation, no abnormal pulsatility or arterial bruits, normal bowel sounds, no hepatosplenomegaly Extremities: no clubbing, cyanosis or edema; 2+ radial, ulnar and brachial pulses bilaterally; 2+ right femoral, posterior tibial and dorsalis pedis pulses; 2+ left femoral, posterior tibial and dorsalis pedis pulses; no subclavian or femoral bruits Neurological: grossly nonfocal Psych: Normal mood and affect    Wt Readings from Last 3 Encounters:  04/02/22 183 lb (83 kg)  01/29/22 182 lb 9.6 oz (82.8 kg)  02/09/21 181 lb 6.4 oz (82.3 kg)    Studies/Labs Reviewed:  Leane Call 11/10/2020   Findings are consistent with prior myocardial infarction. The study is intermediate risk.   No ST deviation was noted.   LV perfusion is abnormal. There is no evidence of ischemia. There is evidence of infarction. Defect 1: There is a medium defect with moderate reduction in uptake present in the apical to basal inferior and apex location(s) that is fixed. There is abnormal wall motion in the defect area. Consistent with infarction.    Left ventricular function is abnormal. Nuclear stress EF: 45 %. The left ventricular ejection fraction is mildly decreased (45-54%). End diastolic cavity size is normal.   Prior study available for comparison from 03/25/2013. There are changes compared to prior study. The left ventricular ejection fraction has  decreased.  Echocardiogram 12/05/2020   1. Left ventricular ejection fraction, by estimation, is 55 to 60%. Left  ventricular ejection fraction by 3D volume is 58 %. The left ventricle has  normal function. The left ventricle has no regional wall motion  abnormalities. Left ventricular diastolic   parameters are consistent with Grade I diastolic dysfunction (impaired  relaxation). The average left ventricular global longitudinal strain is  20.2 %. The global longitudinal strain is normal.   2. Right ventricular systolic function is normal. The right ventricular  size is normal. Tricuspid regurgitation signal is inadequate for assessing  PA pressure.   3. The mitral valve is normal in structure. Trivial mitral valve  regurgitation. No evidence of mitral stenosis.   4. The aortic valve is normal in structure. Aortic valve regurgitation is  not visualized. No aortic stenosis is present.   5. The inferior vena cava is normal in size with greater than 50%  respiratory variability, suggesting right atrial pressure of 3 mmHg.   Event monitor 02/20/2022:   The dominant rhythm is normal sinus rhythm with normal circadian variation.  There is first-degree AV block, but there is no evidence of second or higher grade AV block.   There are rare isolated PACs and PVCs.  No notes of ventricular tachycardia or atrial fibrillation.   Normal arrhythmia monitor except for rare isolated ectopic beats.  EKG:  EKG is ordered today and shows sinus rhythm with mild first-degree AV block (PR 220 ms), minor nonspecific T wave changes.  Otherwise normal.   BMET    Component Value Date/Time   NA 142  02/12/2022 1422   K 4.9 02/12/2022 1422   CL 102 02/12/2022 1422   CO2 28 02/12/2022 1422   GLUCOSE 129 (H) 02/12/2022 1422   GLUCOSE 107 (H) 09/25/2020 0603   BUN 18 02/12/2022 1422   CREATININE 1.14 02/12/2022 1422   CREATININE 0.96 06/30/2013 1653   CALCIUM 9.4 02/12/2022 1422   EGFR 63 02/12/2022 1422   GFRNONAA >60 09/25/2020 0603  TSH 2.260 and ALT 16 on 01/29/2022 Lipid Panel     Component Value Date/Time   CHOL 122 03/25/2013 0312   TRIG 104 03/25/2013 0312   HDL 52 03/25/2013 0312   CHOLHDL 2.3 03/25/2013 0312   VLDL 21 03/25/2013 0312   LDLCALC 49 03/25/2013 0312   11/28/2020 Cholesterol 142, HDL 54, LDL 64, triglycerides 138 TSH 1.680 ASSESSMENT:    1. Palpitations   2. Coronary artery disease involving native coronary artery of native heart without angina pectoris   3. NSVT (nonsustained ventricular tachycardia) ([place])   4. Hypercholesterolemia   5. On amiodarone therapy   6. Essential hypertension      PLAN:  In order of problems listed above:  CAD S/P CABG: He does not have angina pectoris despite the fact that he is fairly active.  Native coronaries occluded, graft dependent.   No evidence of reversible ischemia on recent nuclear stress test, although a new inferior wall defect and reduced EF was reported.  On my review of the images it is not really possible to distinguish whether the inferior wall abnormality represents scar versus diaphragmatic attenuation artifact.  On aspirin and statin, intolerant to beta-blockers due to bradycardia. NSVT: VT was associated with syncope several years ago.  During subsequent 3 years of loop recorder monitoring, there was no recurrence of ventricular tachycardia while on amiodarone 100 mg daily.  The loop recorder is no longer active.  Episode of rapid palpitations at night  that led to emergency room evaluation in July 2022 and occasional episodes of flushing and rapid palpitations since then.  However when we had him wear  rhythm monitors on 2 occasions in the last few years (September 2019 and November 2023) we did not identify any serious arrhythmia.  No major structural disease abnormalities identified.  Continue amiodarone. HLP: Most recent LDL that is available for review was at target less than 70.  Aware of the interaction between simvastatin and amiodarone, but the dose of simvastatin is low and he has not had any complications in many years of treatment. Amiodarone: He has sinus bradycardia and mild first-degree AV block, but no other signs of toxicity.  Normal ALT and TSH levels in November 2023, 2 months ago.  Reminded that he needs liver and thyroid function tests at least every 6 months and a yearly eye exam, also to avoid excessive sun exposure. HTN: Well-controlled.  Would recommend continuing the current dose of chlorthalidone since it seems to be working well for him.    Medication Adjustments/Labs and Tests Ordered: Current medicines are reviewed at length with the patient today.  Concerns regarding medicines are outlined above.  Medication changes, Labs and Tests ordered today are listed in the Patient Instructions below. Patient Instructions  Medication Instructions:  Chlorthalidone '25mg'$  daily *If you need a refill on your cardiac medications before your next appointment, please call your pharmacy*  Follow-Up: At [facility], you and your health needs are our priority.  As part of our continuing mission to provide you with exceptional heart care, we have created designated Provider Care Teams.  These Care Teams include your primary Cardiologist (physician) and Advanced Practice Providers (APPs -  Physician Assistants and Nurse Practitioners) who all work together to provide you with the care you need, when you need it.  We recommend signing up for the patient portal called "MyChart".  Sign up information is provided on this After Visit Summary.  MyChart is used to connect with patients  for Virtual Visits (Telemedicine).  Patients are able to view lab/test results, encounter notes, upcoming appointments, etc.  Non-urgent messages can be sent to your provider as well.   To learn more about what you can do with MyChart, go to [email or web address].    Your next appointment:   1 year(s)  Provider:   Sanda Klein, MD        Signed, Sanda Klein, MD  04/05/2022 6:00 PM    [address], [place], [place]  [address] Phone: [phone]; Fax: [phone]

## 2022-12-19 LAB — LAB REPORT - SCANNED
Albumin, Urine POC: 6.2
Creatinine, POC: 117.3 mg/dL
EGFR: 55
Microalb Creat Ratio: 5

## 2022-12-20 ENCOUNTER — Other Ambulatory Visit: Payer: Self-pay | Admitting: Nurse Practitioner

## 2022-12-20 DIAGNOSIS — I10 Essential (primary) hypertension: Secondary | ICD-10-CM

## 2022-12-23 ENCOUNTER — Other Ambulatory Visit: Payer: Self-pay | Admitting: Cardiovascular Disease

## 2022-12-23 DIAGNOSIS — I10 Essential (primary) hypertension: Secondary | ICD-10-CM

## 2022-12-24 ENCOUNTER — Telehealth: Payer: Self-pay | Admitting: Cardiovascular Disease

## 2022-12-24 DIAGNOSIS — Z79899 Other long term (current) drug therapy: Secondary | ICD-10-CM

## 2022-12-24 NOTE — Telephone Encounter (Signed)
Spoke with patient and he states he has been taking Mavik for over 20 years he is not sure why it has been d/c. He has been out for two days and the pharmacy stated we d/c it.  Last OV was 04/02/22 and in your notes it does not say to stop taking it but it was d/c by [employer]. Is he supposed to still take [facility]?

## 2022-12-24 NOTE — Telephone Encounter (Signed)
Pt is requesting a callback regarding medication Mavik (Trandolapril). He stated it's not at the pharmacy for pick up and he's not sure why when he's been taking it but medication is not showing on his medication list. Please advise

## 2022-12-24 NOTE — Telephone Encounter (Signed)
Called to let patient know we are still waiting on a reply from the provider.

## 2022-12-24 NOTE — Telephone Encounter (Signed)
Patient calling back for update. He states that he needs a answer before the pharmacy. Please advise

## 2022-12-25 ENCOUNTER — Other Ambulatory Visit: Payer: Self-pay

## 2022-12-25 DIAGNOSIS — Z79899 Other long term (current) drug therapy: Secondary | ICD-10-CM

## 2022-12-25 NOTE — Telephone Encounter (Signed)
These are in the notes from OV 04/02/22: There have been some changes in his medications and is no longer taking trandolapril but is instead on valsartan he has been taking a full 25 mg tablet of chlorthalidone rather than the prescribed half. His blood pressure has been well-controlled and he has not had issues with weakness or dizziness.   OV notes 01/29/2022 with Bernadene Person, NP: AVS Medication Instructions:  START Valsartan 160 mg daily STOP Trandolapril as directed     Informed the patient that he was instructed to stop taking the Trandolapril 01/29/2022  He reports that he has continued to take this medication and has been able to get it filled over the last almost year even though this medication was taken off of his medication list (d/c'd).   Informed him that he was not supposed to be taking all 3 of these medications (also taking chlorthalidone) together.  He reports that he has not taken the Trandolapril in 3 days. Instructed him to keep a log of his BP daily for a few weeks and let us know if it is greater than or equal to 140/90.  I brought this to Laural Golden, RPH's attention and he said that the patient needs a BMP to check kidney function.   Called the patient and informed him that we need to get some blood work= BMP. He told me that he just got some lab work done. I checked outside lab work and it did not include a BMP. Pt states that he will come to [facility] office to get BMP in a day or 2.

## 2022-12-26 LAB — BASIC METABOLIC PANEL
BUN/Creatinine Ratio: 21 (ref 10–24)
BUN: 26 mg/dL (ref 8–27)
CO2: 27 mmol/L (ref 20–29)
Calcium: 9.1 mg/dL (ref 8.6–10.2)
Chloride: 99 mmol/L (ref 96–106)
Creatinine, Ser: 1.23 mg/dL (ref 0.76–1.27)
Glucose: 87 mg/dL (ref 70–99)
Potassium: 4.8 mmol/L (ref 3.5–5.2)
Sodium: 139 mmol/L (ref 134–144)
eGFR: 57 mL/min/{1.73_m2} — ABNORMAL LOW (ref >59)

## 2023-01-18 ENCOUNTER — Other Ambulatory Visit: Payer: Self-pay | Admitting: Nurse Practitioner

## 2023-02-12 ENCOUNTER — Other Ambulatory Visit: Payer: Self-pay | Admitting: Nurse Practitioner

## 2023-02-12 ENCOUNTER — Other Ambulatory Visit: Payer: Self-pay

## 2023-02-12 ENCOUNTER — Other Ambulatory Visit: Payer: Self-pay | Admitting: Cardiovascular Disease

## 2023-02-12 MED ORDER — SIMVASTATIN 20 MG PO TABS: 20.0000 mg | ORAL_TABLET | Freq: Every day | ORAL | 3 refills | Status: DC

## 2023-03-29 ENCOUNTER — Ambulatory Visit: Payer: Medicare Other | Attending: Cardiovascular Disease

## 2023-03-29 ENCOUNTER — Ambulatory Visit: Payer: Medicare Other | Attending: Cardiovascular Disease | Admitting: Cardiovascular Disease

## 2023-03-29 VITALS — BP 134/70 | HR 52 | Ht 70.0 in | Wt 185.2 lb

## 2023-03-29 DIAGNOSIS — Z79899 Other long term (current) drug therapy: Secondary | ICD-10-CM

## 2023-03-29 DIAGNOSIS — I10 Essential (primary) hypertension: Secondary | ICD-10-CM | POA: Diagnosis not present

## 2023-03-29 DIAGNOSIS — R002 Palpitations: Secondary | ICD-10-CM | POA: Diagnosis not present

## 2023-03-29 DIAGNOSIS — I4729 Other ventricular tachycardia: Secondary | ICD-10-CM | POA: Diagnosis not present

## 2023-03-29 DIAGNOSIS — E7800 Pure hypercholesterolemia, unspecified: Secondary | ICD-10-CM

## 2023-03-29 DIAGNOSIS — I2510 Atherosclerotic heart disease of native coronary artery without angina pectoris: Secondary | ICD-10-CM | POA: Diagnosis not present

## 2023-03-29 NOTE — Progress Notes (Unsigned)
Enrolled patient for a 14 day Zio XT  monitor to be mailed to patients home  °

## 2023-03-29 NOTE — Progress Notes (Unsigned)
Cardiology Office Note    Date:  03/30/2023   ID:  Michael Bryant, DOB 1933/07/20, MRN [id number]  PCP:  Pcp, No  Cardiologist:   Thurmon Fair, MD   No chief complaint on file.   History of Present Illness:  Michael Bryant is a 89 y.o. male with a remote history of coronary artery disease presenting as cardiac arrest in 2003 and leading to bypass surgery, near-syncope related to nonsustained ventricular tachycardia, no recurrence on Amiodarone, normal left ventricular systolic function by echo (last checked September 2022).  Over the summer months he is staying very busy, working in his garden tending to his behaviors.  He is more sedentary in the wintertime although his wife tries to get him out to walk.  He has not had any exertional complaints, but a couple of occasions he is woken up early in the morning with a rapid heartbeat and the sensation of unease.  Each time he felt rather "hot".  Each time his symptoms improved after he took a single sublingual nitroglycerin.    About a year ago he called EMS for a similar episode and by the time they arrived his ECG was normal and his palpitations and flushing had subsided.  We had him wear an event monitor after that in December 2023,  which showed rare isolated PACs and PVCs. but no sustained arrhythmia.  Roughly 2 years ago he was seen in the emergency room for palpitations that woke him from sleep and associated left arm pain, again with normal biomarkers and ECG.  That led to his most recent functional evaluation in September 2022. The echocardiogram showed normal left ventricular systolic function (EF 55-60%) and his nuclear stress test showed evidence of an old inferior infarction without reversible ischemia and ejection fraction of 45%.  He does not have exertional angina, exertional dyspnea, orthopnea, PND, syncope or near syncope, lower extremity edema, claudication, focal neurological complaints.  He is the only remaining survivor of 13  siblings.  He remains on a very low-dose of amiodarone 100 mg daily, so far with noticeable side effects.  He had normal TSH in October 2024.  He was initially seen for emergency CABG 12/16/2001 with a witnessed VF arrest , significant LAD disease with intraluminal thrombus. He underwent LIMA-LAD, VG-2 OM 1, VG-distal RCA.  In 03/2013 he underwent a stress Myoview which was negative for ischemia; EF 60%.  In 06/2013 he had syncope following sexual intercourse. When 911 emergency services arrived was found to have extremely frequent PVCs and NSVT on the monitor.  He underwent cardiac catheterization on 07/02/13 which revealed: "Severe native coronary artery disease, graft dependent. Widely patent bypass grafts. Normal left ventricular systolic function.  Beta blockers have been avoided due to bradycardia, but he tolerates low-dose amiodarone. Had an ILR, but the device reached RRT on 11/20/2016. Event monitor for palpitations in September 2019 showed rare PACs and rare PVCs, normal rhythm during subjective "racing heartbeat".  Similarly, an event monitor in November 2023 showed only isolated PACs and PVCs.  Nuclear stress test in September 2022 showed old inferior infarction with EF 45%, but the echo showed normal LV regional wall motion and EF 55 to 60%.  Past Medical History:  Diagnosis Date   CAD (coronary artery disease)    VF arrest >>  s/p CABG (2003) // Myoview 1/15: no ischemia // Cath 4/15: all grafts patent   HTN (hypertension)    Hx of VF arrest in 2003 2003   Hyperlipidemia  NSVT (nonsustained ventricular tachycardia) (HCC) 06/16/2013   Rx with amiodarone // Echo 6/17: EF 55-60, Gr 1 DD, apical HK    Past Surgical History:  Procedure Laterality Date   CARDIAC CATHETERIZATION  12/15/2001   60% proximal LAD narrowing with active mobile thrombosis w/50% sequential areas,50-60% pro   CORONARY ARTERY BYPASS GRAFT  2004   LIMA to LAD, SVG to OM, SVG to RCA   LEFT HEART CATHETERIZATION  WITH CORONARY/GRAFT ANGIOGRAM N/A 07/02/2013   Procedure: LEFT HEART CATHETERIZATION WITH Isabel Caprice;  Surgeon: Thurmon Fair, MD;  Location: [facility]ATH [facility];  Service: Cardiovascular;  Laterality: N/A;   NM MYOCAR PERF WALL MOTION  12/09/2009   mod. perfusion defect basal inferior and mid inferior regions c/w  infarct/scar, EF 54%   TONSILLECTOMY AND ADENOIDECTOMY      Current Medications: Outpatient Medications Prior to Visit  Medication Sig Dispense Refill   amiodarone (PACERONE) 200 MG tablet TAKE 1/2 TABLET(100 MG) BY MOUTH DAILY 45 tablet 0   aspirin EC 81 MG tablet Take 81 mg by mouth daily.     beta carotene w/minerals (OCUVITE) tablet Take 1 tablet by mouth daily.     chlorpheniramine (CHLOR-TRIMETON) 4 MG tablet Take 4 mg by mouth daily as needed (allergies).     chlorthalidone (HYGROTON) 25 MG tablet Take 1 tablet (25 mg total) by mouth daily. 90 tablet 3   Cholecalciferol (VITAMIN D) 50 MCG (2000 UT) CAPS Take 2,000 Units by mouth daily.     CINNAMON PO Take 1 tablet by mouth daily.      DICLOFENAC PO Take 100 mg by mouth daily.      fish oil-omega-3 fatty acids 1000 MG capsule Take 1 g by mouth daily.      GELATIN PO Take 9 g by mouth daily.     Glucosamine 500 MG CAPS daily.     Misc Natural Products (PROSTATE HEALTH) CAPS Take 1 capsule by mouth daily.     nitroGLYCERIN (NITROSTAT) 0.4 MG SL tablet Place 1 tablet (0.4 mg total) under the tongue every 5 (five) minutes as needed for chest pain. 75 tablet 3   omeprazole (PRILOSEC) 10 MG capsule Take 10 mg by mouth daily.     Polyethyl Glycol-Propyl Glycol (SYSTANE OP) Place 1 drop into both eyes daily as needed (dry eyes).     simvastatin (ZOCOR) 20 MG tablet Take 1 tablet (20 mg total) by mouth daily at 6 PM. 90 tablet 3   tamsulosin (FLOMAX) 0.4 MG CAPS capsule Take 0.4 mg by mouth daily after supper.      valsartan (DIOVAN) 160 MG tablet TAKE 1 TABLET(160 MG) BY MOUTH DAILY 90 tablet 0   Diclofenac Sodium CR 100 MG  24 hr tablet Take 100 mg by mouth daily as needed.     No facility-administered medications prior to visit.     Allergies:   Codeine   Social History   Socioeconomic History   Marital status: Married    Spouse name: Not on file   Number of children: 2   Years of education: Not on file   Highest education level: Not on file  Occupational History   Occupation: Retired  Tobacco Use   Smoking status: Former    Types: Cigarettes   Smokeless tobacco: Former   Tobacco comments:    Quit 1993  Vaping Use   Vaping status: Never Used  Substance and Sexual Activity   Alcohol use: No   Drug use: No   Sexual activity:  Not on file  Other Topics Concern   Not on file  Social History Narrative   Lives at home with wife.          Social Drivers of Corporate investment banker Strain: Not on file  Food Insecurity: Not on file  Transportation Needs: Not on file  Physical Activity: Not on file  Stress: Not on file  Social Connections: Not on file     Family History:  The patient's family history includes CAD (age of onset: 78) in his father; Cancer (age of onset: 42) in his mother; Heart disease in his father.   ROS:   Please see the history of present illness.    ROS All other systems are reviewed and are negative.   PHYSICAL EXAM:   VS:  BP 134/70 (BP Location: Left Arm, Patient Position: Sitting)   Pulse (!) 52   Ht 5\' 10"  (1.778 m)   Wt 185 lb 3.2 oz (84 kg)   SpO2 95%   BMI 26.57 kg/m      General: Alert, oriented x3, no distress, appears very fit for his age. Head: no evidence of trauma, PERRL, EOMI, no exophtalmos or lid lag, no myxedema, no xanthelasma; normal ears, nose and oropharynx Neck: normal jugular venous pulsations and no hepatojugular reflux; brisk carotid pulses without delay and no carotid bruits Chest: clear to auscultation, no signs of consolidation by percussion or palpation, normal fremitus, symmetrical and full respiratory  excursions Cardiovascular: normal position and quality of the apical impulse, regular rhythm, normal first and second heart sounds, no murmurs, rubs or gallops Abdomen: no tenderness or distention, no masses by palpation, no abnormal pulsatility or arterial bruits, normal bowel sounds, no hepatosplenomegaly Extremities: no clubbing, cyanosis or edema; 2+ radial, ulnar and brachial pulses bilaterally; 2+ right femoral, posterior tibial and dorsalis pedis pulses; 2+ left femoral, posterior tibial and dorsalis pedis pulses; no subclavian or femoral bruits Neurological: grossly nonfocal Psych: Normal mood and affect    Wt Readings from Last 3 Encounters:  03/29/23 185 lb 3.2 oz (84 kg)  04/02/22 183 lb (83 kg)  01/29/22 182 lb 9.6 oz (82.8 kg)    Studies/Labs Reviewed:  Steffanie Dunn 11/10/2020   Findings are consistent with prior myocardial infarction. The study is intermediate risk.   No ST deviation was noted.   LV perfusion is abnormal. There is no evidence of ischemia. There is evidence of infarction. Defect 1: There is a medium defect with moderate reduction in uptake present in the apical to basal inferior and apex location(s) that is fixed. There is abnormal wall motion in the defect area. Consistent with infarction.   Left ventricular function is abnormal. Nuclear stress EF: 45 %. The left ventricular ejection fraction is mildly decreased (45-54%). End diastolic cavity size is normal.   Prior study available for comparison from 03/25/2013. There are changes compared to prior study. The left ventricular ejection fraction has decreased.  Echocardiogram 12/05/2020   1. Left ventricular ejection fraction, by estimation, is 55 to 60%. Left  ventricular ejection fraction by 3D volume is 58 %. The left ventricle has  normal function. The left ventricle has no regional wall motion  abnormalities. Left ventricular diastolic   parameters are consistent with Grade I diastolic dysfunction  (impaired  relaxation). The average left ventricular global longitudinal strain is  20.2 %. The global longitudinal strain is normal.   2. Right ventricular systolic function is normal. The right ventricular  size is normal. Tricuspid regurgitation  signal is inadequate for assessing  PA pressure.   3. The mitral valve is normal in structure. Trivial mitral valve  regurgitation. No evidence of mitral stenosis.   4. The aortic valve is normal in structure. Aortic valve regurgitation is  not visualized. No aortic stenosis is present.   5. The inferior vena cava is normal in size with greater than 50%  respiratory variability, suggesting right atrial pressure of 3 mmHg.   Event monitor 02/20/2022:   The dominant rhythm is normal sinus rhythm with normal circadian variation.  There is first-degree AV block, but there is no evidence of second or higher grade AV block.   There are rare isolated PACs and PVCs.  No notes of ventricular tachycardia or atrial fibrillation.   Normal arrhythmia monitor except for rare isolated ectopic beats.  EKG:    EKG Interpretation Date/Time:  Friday March 29 2023 10:59:15 EST Ventricular Rate:  52 PR Interval:  238 QRS Duration:  88 QT Interval:  440 QTC Calculation: 409 R Axis:   7  Text Interpretation: Sinus bradycardia with 1st degree A-V block Possible Inferior infarct , age undetermined Cannot rule out Anterior infarct , age undetermined When compared with ECG of 25-Sep-2020 05:52, No significant change since last tracing Confirmed by Nikkole Placzek [phone]) on 03/29/2023 11:12:22 AM          BMET    Component Value Date/Time   NA 139 12/25/2022 1506   K 4.8 12/25/2022 1506   CL 99 12/25/2022 1506   CO2 27 12/25/2022 1506   GLUCOSE 87 12/25/2022 1506   GLUCOSE 107 (H) 09/25/2020 0603   BUN 26 12/25/2022 1506   CREATININE 1.23 12/25/2022 1506   CREATININE 0.96 06/30/2013 1653   CALCIUM 9.1 12/25/2022 1506   EGFR 57 (L) 12/25/2022 1506    GFRNONAA >60 09/25/2020 0603  TSH 2.260 and ALT 16 on 01/29/2022 Lipid Panel     Component Value Date/Time   CHOL 122 03/25/2013 0312   TRIG 104 03/25/2013 0312   HDL 52 03/25/2013 0312   CHOLHDL 2.3 03/25/2013 0312   VLDL 21 03/25/2013 0312   LDLCALC 49 03/25/2013 0312   11/28/2020 Cholesterol 142, HDL 54, LDL 64, triglycerides 138 TSH 1.680 ASSESSMENT:    1. Coronary artery disease involving native coronary artery of native heart without angina pectoris   2. Palpitations   3. NSVT (nonsustained ventricular tachycardia) (HCC)   4. Hypercholesterolemia   5. On amiodarone therapy   6. Essential hypertension      PLAN:  In order of problems listed above:  CAD S/P CABG: He does not have angina with activity.  He has had some complaints of palpitations and flushing that seem to improve with a single sublingual nitroglycerin.  It is known that his native coronaries are occluded, he is graft dependent.   No evidence of reversible ischemia on 2022 nuclear stress test, although a new inferior wall defect and reduced EF was reported.  On aspirin and statin, intolerant to beta-blockers due to bradycardia. NSVT: Continues to have issues with palpitations.  Will repeat an arrhythmia monitor and reevaluate LV function with an echocardiogram..  VT was associated with syncope several years ago.  During subsequent 3 years of loop recorder monitoring, there was no recurrence of ventricular tachycardia while on amiodarone 100 mg daily.  The loop recorder is no longer active.  Episode of rapid palpitations at night that led to emergency room evaluation in July 2022 and occasional episodes of flushing and rapid  palpitations since then.  However when we had him wear rhythm monitors on 2 occasions in the last few years (September 2019 and November 2023) we did not identify any serious arrhythmia.  No major structural disease abnormalities identified.  Continue amiodarone. HLP: LDL less than 70.  Although  there is a possible interaction between amiodarone and simvastatin, both these medications are on very low doses and has never had side effects attributable to the simvastatin. Amiodarone: He has sinus bradycardia and mild first-degree AV block, but no other signs of toxicity.  Normal recent TSH.  I do not think he has had a recent liver function test, but he thinks he may have had them done with his primary care provider.  He should have LFTs checked every 6 months.  Avoid sun exposure. HTN: Well-controlled.  Continue same medications (chlorthalidone and valsartan).    Medication Adjustments/Labs and Tests Ordered: Current medicines are reviewed at length with the patient today.  Concerns regarding medicines are outlined above.  Medication changes, Labs and Tests ordered today are listed in the Patient Instructions below. Patient Instructions  Medication Instructions:  No changes *If you need a refill on your cardiac medications before your next appointment, please call your pharmacy*  Testing/Procedures: Your physician has requested that you have an echocardiogram. Echocardiography is a painless test that uses sound waves to create images of your heart. It provides your doctor with information about the size and shape of your heart and how well your heart's chambers and valves are working. This procedure takes approximately one hour. There are no restrictions for this procedure. Please do NOT wear cologne, perfume, aftershave, or lotions (deodorant is allowed). Please arrive 15 minutes prior to your appointment time.  Please note: We ask at that you not bring children with you during ultrasound (echo/ vascular) testing. Due to room size and safety concerns, children are not allowed in the ultrasound rooms during exams. Our front office staff cannot provide observation of children in our lobby area while testing is being conducted. An adult accompanying a patient to their appointment will only be  allowed in the ultrasound room at the discretion of the ultrasound technician under special circumstances. We apologize for any inconvenience.  Your physician has recommended that you wear a 14 DAY ZIO-PATCH monitor. The Zio patch cardiac monitor continuously records heart rhythm data for up to 14 days, this is for patients being evaluated for multiple types heart rhythms. For the first 24 hours post application, please avoid getting the Zio monitor wet in the shower or by excessive sweating during exercise. After that, feel free to carry on with regular activities. Keep soaps and lotions away from the ZIO XT Patch.  This will be mailed to you, please expect 7-10 days to receive.    Applying the monitor   Shave hair from upper left chest.   Hold abrader disc by orange tab.  Rub abrader in 40 strokes over left upper chest as indicated in your monitor instructions.   Clean area with 4 enclosed alcohol pads .  Use all pads to assure are is cleaned thoroughly.  Let dry.   Apply patch as indicated in monitor instructions.  Patch will be place under collarbone on left side of chest with arrow pointing upward.   Rub patch adhesive wings for 2 minutes.Remove white label marked "1".  Remove white label marked "2".  Rub patch adhesive wings for 2 additional minutes.   While looking in a mirror, press and release button  in center of patch.  A small green light will flash 3-4 times .  This will be your only indicator the monitor has been turned on.     Do not shower for the first 24 hours.  You may shower after the first 24 hours.   Press button if you feel a symptom. You will hear a small click.  Record Date, Time and Symptom in the Patient Log Book.   When you are ready to remove patch, follow instructions on last 2 pages of Patient Log Book.  Stick patch monitor onto last page of Patient Log Book.   Place Patient Log Book in [place] box.  Use locking tab on box and tape box closed securely.  The Orange  and [employer] has [employer] on it.  Please place in mailbox as soon as possible.  Your physician should have your test results approximately 7 days after the monitor has been mailed back to [facility].   Call [facility] Customer Care at [phone] if you have questions regarding your ZIO XT patch monitor.  Call them immediately if you see an orange light blinking on your monitor.   If your monitor falls off in less than 4 days contact our Monitor department at [phone].  If your monitor becomes loose or falls off after 4 days call [employer] at [phone] for suggestions on securing your monitor     Follow-Up: At [facility], you and your health needs are our priority.  As part of our continuing mission to provide you with exceptional heart care, we have created designated Provider Care Teams.  These Care Teams include your primary Cardiologist (physician) and Advanced Practice Providers (APPs -  Physician Assistants and Nurse Practitioners) who all work together to provide you with the care you need, when you need it.  We recommend signing up for the patient portal called "MyChart".  Sign up information is provided on this After Visit Summary.  MyChart is used to connect with patients for Virtual Visits (Telemedicine).  Patients are able to view lab/test results, encounter notes, upcoming appointments, etc.  Non-urgent messages can be sent to your provider as well.   To learn more about what you can do with MyChart, go to [email or web address].    Your next appointment:   6 month(s)  Provider:   Thurmon Fair, MD             Signed, Thurmon Fair, MD  03/30/2023 8:03 PM    [facility] [address] [place], [place], [name]  [address] Phone: [phone]; Fax: [phone]

## 2023-03-29 NOTE — Patient Instructions (Signed)
Medication Instructions:  No changes *If you need a refill on your cardiac medications before your next appointment, please call your pharmacy*  Testing/Procedures: Your physician has requested that you have an echocardiogram. Echocardiography is a painless test that uses sound waves to create images of your heart. It provides your doctor with information about the size and shape of your heart and how well your heart's chambers and valves are working. This procedure takes approximately one hour. There are no restrictions for this procedure. Please do NOT wear cologne, perfume, aftershave, or lotions (deodorant is allowed). Please arrive 15 minutes prior to your appointment time.  Please note: We ask at that you not bring children with you during ultrasound (echo/ vascular) testing. Due to room size and safety concerns, children are not allowed in the ultrasound rooms during exams. Our front office staff cannot provide observation of children in our lobby area while testing is being conducted. An adult accompanying a patient to their appointment will only be allowed in the ultrasound room at the discretion of the ultrasound technician under special circumstances. We apologize for any inconvenience.  Your physician has recommended that you wear a 14 DAY ZIO-PATCH monitor. The Zio patch cardiac monitor continuously records heart rhythm data for up to 14 days, this is for patients being evaluated for multiple types heart rhythms. For the first 24 hours post application, please avoid getting the Zio monitor wet in the shower or by excessive sweating during exercise. After that, feel free to carry on with regular activities. Keep soaps and lotions away from the ZIO XT Patch.  This will be mailed to you, please expect 7-10 days to receive.    Applying the monitor   Shave hair from upper left chest.   Hold abrader disc by orange tab.  Rub abrader in 40 strokes over left upper chest as indicated in your  monitor instructions.   Clean area with 4 enclosed alcohol pads .  Use all pads to assure are is cleaned thoroughly.  Let dry.   Apply patch as indicated in monitor instructions.  Patch will be place under collarbone on left side of chest with arrow pointing upward.   Rub patch adhesive wings for 2 minutes.Remove white label marked "1".  Remove white label marked "2".  Rub patch adhesive wings for 2 additional minutes.   While looking in a mirror, press and release button in center of patch.  A small green light will flash 3-4 times .  This will be your only indicator the monitor has been turned on.     Do not shower for the first 24 hours.  You may shower after the first 24 hours.   Press button if you feel a symptom. You will hear a small click.  Record Date, Time and Symptom in the Patient Log Book.   When you are ready to remove patch, follow instructions on last 2 pages of Patient Log Book.  Stick patch monitor onto last page of Patient Log Book.   Place Patient Log Book in [place] box.  Use locking tab on box and tape box closed securely.  The [employer] and [employer] has [employer] on it.  Please place in mailbox as soon as possible.  Your physician should have your test results approximately 7 days after the monitor has been mailed back to [facility].   Call [facility] Customer Care at [phone] if you have questions regarding your ZIO XT patch monitor.  Call them immediately if you  see an orange light blinking on your monitor.   If your monitor falls off in less than 4 days contact our Monitor department at [phone].  If your monitor becomes loose or falls off after 4 days call [employer] at [phone] for suggestions on securing your monitor     Follow-Up: At [facility], you and your health needs are our priority.  As part of our continuing mission to provide you with exceptional heart care, we have created designated Provider Care Teams.  These Care Teams  include your primary Cardiologist (physician) and Advanced Practice Providers (APPs -  Physician Assistants and Nurse Practitioners) who all work together to provide you with the care you need, when you need it.  We recommend signing up for the patient portal called "MyChart".  Sign up information is provided on this After Visit Summary.  MyChart is used to connect with patients for Virtual Visits (Telemedicine).  Patients are able to view lab/test results, encounter notes, upcoming appointments, etc.  Non-urgent messages can be sent to your provider as well.   To learn more about what you can do with MyChart, go to [email or web address].    Your next appointment:   6 month(s)  Provider:   Thurmon Fair, MD

## 2023-03-30 ENCOUNTER — Encounter: Payer: Self-pay | Admitting: Cardiovascular Disease

## 2023-03-30 DIAGNOSIS — E7800 Pure hypercholesterolemia, unspecified: Secondary | ICD-10-CM | POA: Insufficient documentation

## 2023-04-02 DIAGNOSIS — R002 Palpitations: Secondary | ICD-10-CM | POA: Diagnosis not present

## 2023-04-12 ENCOUNTER — Other Ambulatory Visit: Payer: Self-pay | Admitting: Cardiovascular Disease

## 2023-04-23 ENCOUNTER — Encounter: Payer: Self-pay | Admitting: Cardiovascular Disease

## 2023-04-24 ENCOUNTER — Ambulatory Visit (HOSPITAL_BASED_OUTPATIENT_CLINIC_OR_DEPARTMENT_OTHER): Payer: Medicare Other

## 2023-04-24 DIAGNOSIS — I2510 Atherosclerotic heart disease of native coronary artery without angina pectoris: Secondary | ICD-10-CM | POA: Diagnosis not present

## 2023-04-24 LAB — ECHOCARDIOGRAM COMPLETE
Area-P 1/2: 2.69 cm2
S' Lateral: 2.23 cm

## 2023-04-25 ENCOUNTER — Encounter: Payer: Self-pay | Admitting: Cardiovascular Disease

## 2023-04-28 ENCOUNTER — Other Ambulatory Visit: Payer: Self-pay | Admitting: Cardiovascular Disease

## 2023-05-01 ENCOUNTER — Telehealth: Payer: Self-pay | Admitting: Cardiovascular Disease

## 2023-05-01 DIAGNOSIS — I2510 Atherosclerotic heart disease of native coronary artery without angina pectoris: Secondary | ICD-10-CM

## 2023-05-01 NOTE — Telephone Encounter (Signed)
Michael Fair, MD 04/25/2023  9:48 AM EST     The echo does show a slight reduction in heart pumping strength compared to the last echo in 2022. Due to the history of previous bypass surgery, I have a high level of suspicion that the current symptoms he is experiencing and the reduction in left ventricular function is due to new coronary disease and recommend a cardiac PET scan. Can we please order that?    Attempted to call patient x2, no answer left message requesting call back.

## 2023-05-01 NOTE — Telephone Encounter (Signed)
Pt returning call for Echo results, requesting cb

## 2023-05-02 NOTE — Telephone Encounter (Signed)
 2nd attempt to call patient x2, no answer left message requesting a call back.

## 2023-05-03 NOTE — Telephone Encounter (Signed)
Patient identification verified by 2 forms. Marilynn Rail, RN    Received call from patient  Relayed provider result message below  Patient agrees with PET scan order  Informed patient he will be outreached for scheduling  Patient verbalized understanding, no questions at this time

## 2023-05-03 NOTE — Addendum Note (Signed)
Addended by: Thurmon Fair on: 05/03/2023 11:50 AM   Modules accepted: Orders

## 2023-05-03 NOTE — Telephone Encounter (Signed)
3rd attempt to call patient, no answer, left message requesting a call back Nursing will await for patient to return call   Mychart message sent.

## 2023-05-10 ENCOUNTER — Telehealth: Payer: Self-pay | Admitting: Cardiovascular Disease

## 2023-05-10 NOTE — Telephone Encounter (Signed)
 Patient identification verified by 2 forms. Marilynn Rail, RN    Received call from patient  Patient states:   -received multiple calls today/this week   -received message about PET scan scheduled for 5/7 at 8:40am  Informed patient RN did not outreach, was likely scheduling  Patient states appointment day and time works with his schedule  Patient has no questions or concerns at this time

## 2023-05-10 NOTE — Telephone Encounter (Signed)
 Patient returned RN's call.

## 2023-06-03 ENCOUNTER — Other Ambulatory Visit: Payer: Self-pay

## 2023-06-03 MED ORDER — AMIODARONE HCL 200 MG PO TABS: 100.0000 mg | ORAL_TABLET | Freq: Every day | ORAL | 3 refills | Status: AC

## 2023-07-15 ENCOUNTER — Encounter (HOSPITAL_COMMUNITY): Payer: Self-pay

## 2023-07-16 ENCOUNTER — Telehealth (HOSPITAL_COMMUNITY): Payer: Self-pay | Admitting: Emergency Medicine

## 2023-07-16 ENCOUNTER — Telehealth (HOSPITAL_COMMUNITY): Payer: Self-pay | Admitting: *Deleted

## 2023-07-16 NOTE — Telephone Encounter (Signed)
Patient returning call about his upcoming cardiac imaging study; pt verbalizes understanding of appt date/time, parking situation and where to check in, pre-test NPO status, and verified current allergies; name and call back number provided for further questions should they arise  Gordy Clement RN Navigator Cardiac Imaging [facility] [phone] office [phone] cell  Patient aware to avoid caffeine 12 hours prior to his cardiac PET scan.

## 2023-07-16 NOTE — Telephone Encounter (Signed)
Attempted to call patient regarding upcoming cardiac PET appointment. Left message on voicemail with name and callback number Aidan Moten RN Navigator Cardiac Imaging [facility] [phone] Office [phone] Cell  

## 2023-07-17 ENCOUNTER — Ambulatory Visit (HOSPITAL_COMMUNITY)
Admission: RE | Admit: 2023-07-17 | Discharge: 2023-07-17 | Disposition: A | Discharge status: Home / Self Care | Payer: Medicare Other | Source: Ambulatory Visit | Attending: Cardiovascular Disease | Admitting: Cardiovascular Disease

## 2023-07-17 DIAGNOSIS — I2510 Atherosclerotic heart disease of native coronary artery without angina pectoris: Secondary | ICD-10-CM | POA: Diagnosis present

## 2023-07-17 LAB — NM PET CT CARDIAC PERFUSION MULTI W/ABSOLUTE BLOODFLOW
MBFR: 2.04
Nuc Rest EF: 43 %
Nuc Stress EF: 52 %
Rest MBF: 0.75 ml/g/min
Rest Nuclear Isotope Dose: 21.8 mCi
ST Depression (mm): 0 mm
Stress MBF: 1.53 ml/g/min
Stress Nuclear Isotope Dose: 21.7 mCi
TID: 1.14

## 2023-07-17 MED ORDER — REGADENOSON 0.4 MG/5ML IV SOLN
0.4000 mg | Freq: Once | INTRAVENOUS | Status: AC
  Administered 2023-07-17: 0.4 mg via INTRAVENOUS

## 2023-07-17 MED ORDER — REGADENOSON 0.4 MG/5ML IV SOLN
INTRAVENOUS | Status: AC
Start: 2023-07-17 — End: ?
  Filled 2023-07-17: qty 5

## 2023-07-17 MED ORDER — RUBIDIUM RB82 GENERATOR (RUBYFILL)
21.7200 | PACK | Freq: Once | INTRAVENOUS | Status: AC
  Administered 2023-07-17: 21.72 via INTRAVENOUS

## 2023-07-17 MED ORDER — RUBIDIUM RB82 GENERATOR (RUBYFILL)
21.7500 | PACK | Freq: Once | INTRAVENOUS | Status: AC
  Administered 2023-07-17: 21.75 via INTRAVENOUS

## 2023-07-18 ENCOUNTER — Encounter: Payer: Self-pay | Admitting: Cardiovascular Disease

## 2023-08-23 ENCOUNTER — Encounter: Payer: Self-pay | Admitting: Cardiovascular Disease

## 2023-10-25 ENCOUNTER — Encounter (INDEPENDENT_AMBULATORY_CARE_PROVIDER_SITE_OTHER): Payer: Self-pay | Admitting: Physician Assistant

## 2023-10-25 ENCOUNTER — Ambulatory Visit (INDEPENDENT_AMBULATORY_CARE_PROVIDER_SITE_OTHER): Admitting: Physician Assistant

## 2023-10-25 VITALS — BP 146/77 | HR 67

## 2023-10-25 DIAGNOSIS — H60502 Unspecified acute noninfective otitis externa, left ear: Secondary | ICD-10-CM

## 2023-10-25 DIAGNOSIS — H6092 Unspecified otitis externa, left ear: Secondary | ICD-10-CM

## 2023-10-25 NOTE — Progress Notes (Signed)
 Dear Dr. Caleen, Here is my assessment for our mutual patient, Michael Bryant. Thank you for allowing me the opportunity to care for your patient. Please do not hesitate to contact me should you have any other questions. Sincerely, Chyrl Cohen PA-C  Otolaryngology Clinic Note Referring provider: Dr. Caleen HPI:  Michael Bryant is a 89 y.o. male kindly referred by Dr. Caleen   The patient is an 89 year old gentleman seen in our office for evaluation of left ear fullness.  He notes over the last 2 to 3 months he has had some fullness in the left ear, he has had some clicking and popping in the ear.  He notes over the last 6 months he has had some intermittent drainage from the left ear.  He reports that originally he was seen and given antibiotic drops for the left ear.  He used those his symptoms resolved, but notes since that time he has had intermittent drainage from the ear.  He notes a significant past medical history of recurrent otitis media as a child but no significant infections in his adult.  Denies any trauma to the ear.  Denies any fever.  He denies any changes to hearing.    Independent Review of Additional Tests or Records:  PCP note 10/17/2023-    PMH/Meds/All/SocHx/FamHx/ROS:   Past Medical History:  Diagnosis Date   CAD (coronary artery disease)    VF arrest >>  s/p CABG (2003) // Myoview  1/15: no ischemia // Cath 4/15: all grafts patent   HTN (hypertension)    Hx of VF arrest in 2003 2003   Hyperlipidemia    NSVT (nonsustained ventricular tachycardia) (HCC) 06/16/2013   Rx with amiodarone  // Echo 6/17: EF 55-60, Gr 1 DD, apical HK     Past Surgical History:  Procedure Laterality Date   CARDIAC CATHETERIZATION  12/15/2001   60% proximal LAD narrowing with active mobile thrombosis w/50% sequential areas,50-60% pro   CORONARY ARTERY BYPASS GRAFT  2004   LIMA to LAD, SVG to OM, SVG to RCA   LEFT HEART CATHETERIZATION WITH CORONARY/GRAFT ANGIOGRAM N/A 07/02/2013    Procedure: LEFT HEART CATHETERIZATION WITH EL BILE;  Surgeon: Jerel Balding, MD;  Location: [facility] CATH [facility];  Service: Cardiovascular;  Laterality: N/A;   NM MYOCAR PERF WALL MOTION  12/09/2009   mod. perfusion defect basal inferior and mid inferior regions c/w  infarct/scar, EF 54%   TONSILLECTOMY AND ADENOIDECTOMY      Family History  Problem Relation Age of Onset   Cancer Mother 66   CAD Father 80       Died age 78   Heart disease Father      Social Connections: Not on file      Current Outpatient Medications:    amiodarone  (PACERONE ) 200 MG tablet, Take 0.5 tablets (100 mg total) by mouth daily., Disp: 45 tablet, Rfl: 3   aspirin  EC 81 MG tablet, Take 81 mg by mouth daily., Disp: , Rfl:    beta carotene w/minerals (OCUVITE) tablet, Take 1 tablet by mouth daily., Disp: , Rfl:    chlorpheniramine (CHLOR-TRIMETON) 4 MG tablet, Take 4 mg by mouth daily as needed (allergies)., Disp: , Rfl:    chlorthalidone  (HYGROTON ) 25 MG tablet, TAKE 1 TABLET(25 MG) BY MOUTH DAILY, Disp: 90 tablet, Rfl: 3   Cholecalciferol (VITAMIN D) 50 MCG (2000 UT) CAPS, Take 2,000 Units by mouth daily., Disp: , Rfl:    CINNAMON PO, Take 1 tablet by mouth daily. , Disp: , Rfl:  DICLOFENAC  PO, Take 100 mg by mouth daily. , Disp: , Rfl:    Diclofenac  Sodium CR 100 MG 24 hr tablet, Take 100 mg by mouth daily as needed., Disp: , Rfl:    fish oil-omega-3 fatty acids 1000 MG capsule, Take 1 g by mouth daily. , Disp: , Rfl:    GELATIN PO, Take 9 g by mouth daily., Disp: , Rfl:    Glucosamine 500 MG CAPS, daily., Disp: , Rfl:    Misc Natural Products (PROSTATE HEALTH) CAPS, Take 1 capsule by mouth daily., Disp: , Rfl:    nitroGLYCERIN  (NITROSTAT ) 0.4 MG SL tablet, Place 1 tablet (0.4 mg total) under the tongue every 5 (five) minutes as needed for chest pain., Disp: 75 tablet, Rfl: 3   omeprazole (PRILOSEC) 10 MG capsule, Take 10 mg by mouth daily., Disp: , Rfl:    Polyethyl Glycol-Propyl Glycol (SYSTANE  OP), Place 1 drop into both eyes daily as needed (dry eyes)., Disp: , Rfl:    simvastatin  (ZOCOR ) 20 MG tablet, Take 1 tablet (20 mg total) by mouth daily at 6 PM., Disp: 90 tablet, Rfl: 3   tamsulosin  (FLOMAX ) 0.4 MG CAPS capsule, Take 0.4 mg by mouth daily after supper. , Disp: , Rfl:    valsartan  (DIOVAN ) 160 MG tablet, TAKE 1 TABLET(160 MG) BY MOUTH DAILY, Disp: 90 tablet, Rfl: 3   Physical Exam:   There were no vitals taken for this visit.  Pertinent Findings  CN II-XII intact Right external auditory canal clear TM intact well-pneumatized middle ear space, left EAC with whitish discharge and drainage, TM intact, mild erythema of the EAC canal, external ear with no swelling, no tenderness with palpation of the mastoid Anterior rhinoscopy: Septum midline; bilateral inferior turbinates with hypertrophy No lesions of oral cavity/oropharynx; dentition normal limits No obviously palpable neck masses/lymphadenopathy/thyromegaly No respiratory distress or stridor    Seprately Identifiable Procedures:  None  Impression & Plans:  [name] is a 89 y.o. male with the following   Left otitis externa-  89 year old gentleman seen in our office for evaluation of left-sided ear fullness.  He has otitis externa on my exam today.  He notes a history of the same and has some otic drops at home she recently started using again.  He notes this has helped previously.  He notes that the drainage has been intermittent over the last 6 months.  I do feel at this point that cashed powder would be beneficial for him.  A prescription has been sent to the compounding pharmacy, they will reach out to him and schedule delivery.  I would like him to use this in the left ear daily for 1 week, I would like to see him back in the office approximately week after completing therapy or sooner as needed.  The patient verbalized understanding and agreement to today's plan.   - f/u 3 weeks from now, 1 week after  completing otic therapy   Thank you for allowing me the opportunity to care for your patient. Please do not hesitate to contact me should you have any other questions.  Sincerely, Chyrl Cohen PA-C [facility] Phone: [phone] Fax: [phone]  10/25/2023, 10:13 AM

## 2023-11-10 ENCOUNTER — Other Ambulatory Visit: Payer: Self-pay

## 2023-11-10 ENCOUNTER — Emergency Department (HOSPITAL_BASED_OUTPATIENT_CLINIC_OR_DEPARTMENT_OTHER)
Admission: EM | Admit: 2023-11-10 | Discharge: 2023-11-10 | Disposition: A | Discharge status: Home / Self Care | Attending: Emergency Medicine | Admitting: Emergency Medicine

## 2023-11-10 DIAGNOSIS — Z7982 Long term (current) use of aspirin: Secondary | ICD-10-CM | POA: Diagnosis not present

## 2023-11-10 DIAGNOSIS — I2510 Atherosclerotic heart disease of native coronary artery without angina pectoris: Secondary | ICD-10-CM | POA: Insufficient documentation

## 2023-11-10 DIAGNOSIS — J019 Acute sinusitis, unspecified: Secondary | ICD-10-CM | POA: Insufficient documentation

## 2023-11-10 DIAGNOSIS — B9689 Other specified bacterial agents as the cause of diseases classified elsewhere: Secondary | ICD-10-CM | POA: Insufficient documentation

## 2023-11-10 DIAGNOSIS — R0981 Nasal congestion: Secondary | ICD-10-CM | POA: Diagnosis present

## 2023-11-10 DIAGNOSIS — I10 Essential (primary) hypertension: Secondary | ICD-10-CM | POA: Insufficient documentation

## 2023-11-10 MED ORDER — AMOXICILLIN-POT CLAVULANATE 875-125 MG PO TABS: 1.0000 | ORAL_TABLET | Freq: Two times a day (BID) | ORAL | 0 refills | Status: DC

## 2023-11-10 MED ORDER — AMOXICILLIN-POT CLAVULANATE 875-125 MG PO TABS
1.0000 | ORAL_TABLET | Freq: Once | ORAL | Status: AC
  Administered 2023-11-10: 1 via ORAL
  Filled 2023-11-10: qty 1

## 2023-11-10 NOTE — ED Triage Notes (Signed)
 C/o sinus congestion and headache x 1 week. Seen by PCP and prescribed meds but no relief.

## 2023-11-10 NOTE — Discharge Instructions (Signed)
 Based on your symptoms and exam, you likely have a bacterial sinus infection.  You have been prescribed Augmentin  to treat this infection. Take this antibiotic 2 times a day for the next 7 days.  You were given your first dose here today.  Take your next dose this evening.  Take the full course of your antibiotic even if you start feeling better. Antibiotics may cause you to have diarrhea.  Use 2 puffs of Flonase (fluticasone) in each nostril daily to help with sinus pressure and congestion. This is an over the counter medication  Please follow-up with your PCP if symptoms are not starting to improve within the next 5 days.  Return to the ER for severe headache, fever, vision changes, any other new or concerning symptoms

## 2023-11-10 NOTE — ED Notes (Signed)
 Pt given discharge instructions and reviewed prescriptions. Opportunities given for questions. Pt verbalizes understanding. Jillyn Hidden, RN

## 2023-11-10 NOTE — ED Provider Notes (Signed)
 [place] EMERGENCY DEPARTMENT AT [facility] Provider Note   CSN: [id number] Arrival date & time: 11/10/23  9080     Patient presents with: Nasal Congestion   Michael Bryant is a 89 y.o. male history of hypertension, CAD, presents with concern for sinus congestion and runny nose that has been ongoing for the past week.  He states the pressure seems to be behind his eyes and his nose.  He was prescribed amoxicillin  by his PCP and took 4 pills, but self discontinued when it seemed to not be helping.  He denies any fever, chills, sore throat, cough.  No visual changes.   HPI     Prior to Admission medications   Medication Sig Start Date End Date Taking? Authorizing Provider  amoxicillin -clavulanate (AUGMENTIN ) 875-125 MG tablet Take 1 tablet by mouth 2 (two) times daily. 11/10/23  Yes Veta Palma, PA-C  amiodarone  (PACERONE ) 200 MG tablet Take 0.5 tablets (100 mg total) by mouth daily. 06/03/23   Croitoru, Mihai, MD  aspirin  EC 81 MG tablet Take 81 mg by mouth daily.    [provider]  beta carotene w/minerals (OCUVITE) tablet Take 1 tablet by mouth daily.    [provider]  chlorpheniramine (CHLOR-TRIMETON) 4 MG tablet Take 4 mg by mouth daily as needed (allergies).    [provider]  chlorthalidone  (HYGROTON ) 25 MG tablet TAKE 1 TABLET(25 MG) BY MOUTH DAILY 04/15/23   Croitoru, Mihai, MD  Cholecalciferol (VITAMIN D) 50 MCG (2000 UT) CAPS Take 2,000 Units by mouth daily.    [provider]  CINNAMON PO Take 1 tablet by mouth daily.     [provider]  DICLOFENAC  PO Take 100 mg by mouth daily.     [provider]  Diclofenac  Sodium CR 100 MG 24 hr tablet Take 100 mg by mouth daily as needed.    [provider]  fish oil-omega-3 fatty acids 1000 MG capsule Take 1 g by mouth daily.     [provider]  GELATIN PO Take 9 g by mouth daily.    [provider]  Glucosamine 500 MG CAPS daily.     [provider]  Misc Natural Products (PROSTATE HEALTH) CAPS Take 1 capsule by mouth daily.    [provider]  nitroGLYCERIN  (NITROSTAT ) 0.4 MG SL tablet Place 1 tablet (0.4 mg total) under the tongue every 5 (five) minutes as needed for chest pain. 02/27/21   Croitoru, Mihai, MD  omeprazole (PRILOSEC) 10 MG capsule Take 10 mg by mouth daily.    [provider]  Polyethyl Glycol-Propyl Glycol (SYSTANE OP) Place 1 drop into both eyes daily as needed (dry eyes).    [provider]  simvastatin  (ZOCOR ) 20 MG tablet Take 1 tablet (20 mg total) by mouth daily at 6 PM. 02/12/23   Croitoru, Mihai, MD  tamsulosin  (FLOMAX ) 0.4 MG CAPS capsule Take 0.4 mg by mouth daily after supper.  03/02/13   [provider]  valsartan  (DIOVAN ) 160 MG tablet TAKE 1 TABLET(160 MG) BY MOUTH DAILY 04/29/23   Croitoru, Jerel, MD    Allergies: Codeine    Review of Systems  Constitutional:  Negative for fever.    Updated Vital Signs BP (!) 152/68 (BP Location: Right Arm)   Pulse 75   Temp 97.8 F (36.6 C) (Oral)   Resp 18   SpO2 97%   Physical Exam Vitals and nursing note reviewed.  Constitutional:      General: He is not  in acute distress.    Appearance: He is well-developed.  HENT:     Head: Normocephalic and atraumatic.     Comments: Mild tenderness over the maxillary and frontal sinuses bilaterally    Right Ear: Tympanic membrane normal.     Left Ear: Tympanic membrane normal.     Ears:     Comments: Otic powder in ears bilaterally that was prescribed by his ENT    Nose: Rhinorrhea present.     Mouth/Throat:     Pharynx: No oropharyngeal exudate or posterior oropharyngeal erythema.  Eyes:     Extraocular Movements: Extraocular movements intact.     Conjunctiva/sclera: Conjunctivae normal.     Pupils: Pupils are equal, round, and reactive to light.  Cardiovascular:     Rate and Rhythm: Normal rate and regular rhythm.     Heart sounds: No murmur  heard. Pulmonary:     Effort: Pulmonary effort is normal. No respiratory distress.     Breath sounds: Normal breath sounds.  Abdominal:     Palpations: Abdomen is soft.     Tenderness: There is no abdominal tenderness.  Musculoskeletal:        General: No swelling.     Cervical back: Neck supple.  Skin:    General: Skin is warm and dry.     Capillary Refill: Capillary refill takes less than 2 seconds.  Neurological:     Mental Status: He is alert.  Psychiatric:        Mood and Affect: Mood normal.     (all labs ordered are listed, but only abnormal results are displayed) Labs Reviewed - No data to display  EKG: None  Radiology: No results found.   Procedures   Medications Ordered in the ED  amoxicillin -clavulanate (AUGMENTIN ) 875-125 MG per tablet 1 tablet (has no administration in time range)                                    Medical Decision Making    Differential diagnosis includes but is not limited to viral sinusitis, bacterial sinusitis, COVID, flu, RSV, pneumonia, intracranial mass  ED Course:  Upon initial evaluation, patient is very well-appearing, no acute distress.  Stable vitals aside from his elevated blood pressure upon arrival at 152/68.  He does have some mild tenderness palpation of the maxillary and frontal sinuses bilaterally.  He does have rhinorrhea on exam.  Lungs clear to auscultation bilaterally.  No other symptoms besides the sinus pressure and rhinorrhea.  Given this has been ongoing for over a week, no other symptoms, lower concern for COVID, flu, or RSV.  Do not feel he needed any respiratory testing today.  He does not have any cough or fever to suggest pneumonia. Sinus pressure has been ongoing for over a week, this is becoming more concerning for bacterial sinusitis rather than a viral sinusitis.  I will prescribe him a course of Augmentin  for treatment.  I also discussed use of Flonase with the patient.  Feel he is stable and appropriate  for discharge home at this time   Medications Given: Augmentin    Impression: Bacterial sinusitis  Disposition:  The patient was discharged home with instructions to take 7-day course of Augmentin  as prescribed.  Use 2 puffs of Flonase in each nare daily.  Follow-up with PCP if symptoms not improving within the next 5 days. Return precautions given.    Record Review: External records  from outside source obtained and reviewed including ENT note for otitis externa     This chart was dictated using voice recognition software, Dragon. Despite the best efforts of this provider to proofread and correct errors, errors may still occur which can change documentation meaning.       Final diagnoses:  Acute bacterial sinusitis    ED Discharge Orders          Ordered    amoxicillin -clavulanate (AUGMENTIN ) 875-125 MG tablet  2 times daily        11/10/23 0952               Veta Palma, PA-C 11/10/23 9045    Yolande Lamar BROCKS, MD 11/12/23 [phone]

## 2023-11-15 ENCOUNTER — Encounter (INDEPENDENT_AMBULATORY_CARE_PROVIDER_SITE_OTHER): Payer: Self-pay | Admitting: Physician Assistant

## 2023-11-15 ENCOUNTER — Ambulatory Visit (INDEPENDENT_AMBULATORY_CARE_PROVIDER_SITE_OTHER): Admitting: Physician Assistant

## 2023-11-15 VITALS — BP 135/77 | HR 69

## 2023-11-15 DIAGNOSIS — R0982 Postnasal drip: Secondary | ICD-10-CM

## 2023-11-15 DIAGNOSIS — J329 Chronic sinusitis, unspecified: Secondary | ICD-10-CM | POA: Diagnosis not present

## 2023-11-15 DIAGNOSIS — H60503 Unspecified acute noninfective otitis externa, bilateral: Secondary | ICD-10-CM

## 2023-11-15 DIAGNOSIS — J309 Allergic rhinitis, unspecified: Secondary | ICD-10-CM

## 2023-11-15 MED ORDER — MOMETASONE FUROATE 50 MCG/ACT NA SUSP: 2.0000 | Freq: Every day | NASAL | 12 refills | Status: AC

## 2023-11-15 NOTE — Progress Notes (Signed)
 Dear Dr. Corlis, Here is my assessment for our mutual patient, Michael Bryant. Thank you for allowing me the opportunity to care for your patient. Please do not hesitate to contact me should you have any other questions. Sincerely, Chyrl Cohen PA-C  Otolaryngology Clinic Note Referring provider: Dr. Corlis HPI:  Michael Bryant is a 89 y.o. male kindly referred by Dr. Corlis   The patient is an 89 year old gentleman seen in our office for follow-up evaluation of otitis externa.  He was last seen in the office on 10/25/2023.  Below is a recap of that encounter.   The patient is an 89 year old gentleman seen in our office for evaluation of left ear fullness.  He notes over the last 2 to 3 months he has had some fullness in the left ear, he has had some clicking and popping in the ear.  He notes over the last 6 months he has had some intermittent drainage from the left ear.  He reports that originally he was seen and given antibiotic drops for the left ear.  He used those his symptoms resolved, but notes since that time he has had intermittent drainage from the ear.  He notes a significant past medical history of recurrent otitis media as a child but no significant infections in his adult.  Denies any trauma to the ear.  Denies any fever.  He denies any changes to hearing.   Update 11/15/2023-  Since his last office visit he denies any significant problems with his ears.  He notes he has been using the Cash powder with no difficulty.  He denies any further drainage, no pain, hearing at baseline.  Patient does note that he had a sinus infection, he was seen in the emergency room and prescribed Augmentin  which he has 3 days left of.  He notes a significant history of chronic nasal congestion and postnasal drainage.  He notes he takes daily antihistamine which seems to help with symptoms but the acute sinus infection has been uncomfortable.  He notes bilateral frontal and maxillary sinus pressure, he reports that those  symptoms have significantly improved, no associated fever.  He notes persistent postnasal drainage.  He notes he has not had a sinus infection in several years.    Independent Review of Additional Tests or Records:  ER visit note 11/10/2023   PMH/Meds/All/SocHx/FamHx/ROS:   Past Medical History:  Diagnosis Date   CAD (coronary artery disease)    VF arrest >>  s/p CABG (2003) // Myoview  1/15: no ischemia // Cath 4/15: all grafts patent   HTN (hypertension)    Hx of VF arrest in 2003 2003   Hyperlipidemia    NSVT (nonsustained ventricular tachycardia) (HCC) 06/16/2013   Rx with amiodarone  // Echo 6/17: EF 55-60, Gr 1 DD, apical HK     Past Surgical History:  Procedure Laterality Date   CARDIAC CATHETERIZATION  12/15/2001   60% proximal LAD narrowing with active mobile thrombosis w/50% sequential areas,50-60% pro   CORONARY ARTERY BYPASS GRAFT  2004   LIMA to LAD, SVG to OM, SVG to RCA   LEFT HEART CATHETERIZATION WITH CORONARY/GRAFT ANGIOGRAM N/A 07/02/2013   Procedure: LEFT HEART CATHETERIZATION WITH EL BILE;  Surgeon: Jerel Balding, MD;  Location: [facility] CATH LAB;  Service: Cardiovascular;  Laterality: N/A;   NM MYOCAR PERF WALL MOTION  12/09/2009   mod. perfusion defect basal inferior and mid inferior regions c/w  infarct/scar, EF 54%   TONSILLECTOMY AND ADENOIDECTOMY      Family History  Problem Relation Age of Onset   Cancer Mother 3   CAD Father 64       Died age 86   Heart disease Father      Social Connections: Not on file      Current Outpatient Medications:    amiodarone  (PACERONE ) 200 MG tablet, Take 0.5 tablets (100 mg total) by mouth daily., Disp: 45 tablet, Rfl: 3   amoxicillin -clavulanate (AUGMENTIN ) 875-125 MG tablet, Take 1 tablet by mouth 2 (two) times daily., Disp: 14 tablet, Rfl: 0   aspirin  EC 81 MG tablet, Take 81 mg by mouth daily., Disp: , Rfl:    beta carotene w/minerals (OCUVITE) tablet, Take 1 tablet by mouth daily., Disp: , Rfl:     chlorpheniramine (CHLOR-TRIMETON) 4 MG tablet, Take 4 mg by mouth daily as needed (allergies)., Disp: , Rfl:    chlorthalidone  (HYGROTON ) 25 MG tablet, TAKE 1 TABLET(25 MG) BY MOUTH DAILY, Disp: 90 tablet, Rfl: 3   Cholecalciferol (VITAMIN D) 50 MCG (2000 UT) CAPS, Take 2,000 Units by mouth daily., Disp: , Rfl:    CINNAMON PO, Take 1 tablet by mouth daily. , Disp: , Rfl:    DICLOFENAC  PO, Take 100 mg by mouth daily. , Disp: , Rfl:    Diclofenac  Sodium CR 100 MG 24 hr tablet, Take 100 mg by mouth daily as needed., Disp: , Rfl:    fish oil-omega-3 fatty acids 1000 MG capsule, Take 1 g by mouth daily. , Disp: , Rfl:    GELATIN PO, Take 9 g by mouth daily., Disp: , Rfl:    Glucosamine 500 MG CAPS, daily., Disp: , Rfl:    Misc Natural Products (PROSTATE HEALTH) CAPS, Take 1 capsule by mouth daily., Disp: , Rfl:    nitroGLYCERIN  (NITROSTAT ) 0.4 MG SL tablet, Place 1 tablet (0.4 mg total) under the tongue every 5 (five) minutes as needed for chest pain., Disp: 75 tablet, Rfl: 3   omeprazole (PRILOSEC) 10 MG capsule, Take 10 mg by mouth daily., Disp: , Rfl:    Polyethyl Glycol-Propyl Glycol (SYSTANE OP), Place 1 drop into both eyes daily as needed (dry eyes)., Disp: , Rfl:    simvastatin  (ZOCOR ) 20 MG tablet, Take 1 tablet (20 mg total) by mouth daily at 6 PM., Disp: 90 tablet, Rfl: 3   tamsulosin  (FLOMAX ) 0.4 MG CAPS capsule, Take 0.4 mg by mouth daily after supper. , Disp: , Rfl:    valsartan  (DIOVAN ) 160 MG tablet, TAKE 1 TABLET(160 MG) BY MOUTH DAILY, Disp: 90 tablet, Rfl: 3   Physical Exam:   There were no vitals taken for this visit.  Pertinent Findings  CN II-XII intact Bilateral EAC clear and TM intact with well pneumatized middle ear spaces, powder noted in bilateral external auditory canals, no discharge  Anterior rhinoscopy: Septum midline; bilateral inferior turbinates with moderate hypertrophy No lesions of oral cavity/oropharynx; dentition within normal limits No obviously palpable  neck masses/lymphadenopathy/thyromegaly No respiratory distress or stridor  Seprately Identifiable Procedures:  None  Impression & Plans:  Michael Bryant is a 89 y.o. male with the following   Otitis externa-  Times have resolved, he may discontinue using the otic medication.  I like to see him back in the office if he has any recurrence of symptoms.  Sinusitis-  Patient had sinusitis and is finishing a course of Augmentin .  It sounds like his symptoms have improved but he still has some ongoing postnasal drainage.  He notes at baseline he always has postnasal drainage.  He  will continue using the antihistamine, I would recommend a daily nasal steroid, I recommended Flonase, family would like to try Nasonex , I do think this is reasonable.  A prescription has been sent, if he has persistent symptoms I would like him to reach out to office and schedule follow-up evaluation, he may likely need allergy referral if he has persistent symptoms despite continuous medical management.  The patient verbalized understanding and agreement to today's plan and had no further questions or concerns.   - f/u PRN   Thank you for allowing me the opportunity to care for your patient. Please do not hesitate to contact me should you have any other questions.  Sincerely, Chyrl Cohen PA-C [facility] Phone: [phone] Fax: [phone]  11/15/2023, 10:29 AM

## 2023-11-24 ENCOUNTER — Other Ambulatory Visit: Payer: Self-pay

## 2023-11-24 ENCOUNTER — Emergency Department (HOSPITAL_BASED_OUTPATIENT_CLINIC_OR_DEPARTMENT_OTHER)
Admission: EM | Admit: 2023-11-24 | Discharge: 2023-11-24 | Disposition: A | Discharge status: Home / Self Care | Attending: Emergency Medicine | Admitting: Emergency Medicine

## 2023-11-24 ENCOUNTER — Encounter (HOSPITAL_BASED_OUTPATIENT_CLINIC_OR_DEPARTMENT_OTHER): Payer: Self-pay

## 2023-11-24 ENCOUNTER — Emergency Department (HOSPITAL_BASED_OUTPATIENT_CLINIC_OR_DEPARTMENT_OTHER)

## 2023-11-24 DIAGNOSIS — I2510 Atherosclerotic heart disease of native coronary artery without angina pectoris: Secondary | ICD-10-CM | POA: Insufficient documentation

## 2023-11-24 DIAGNOSIS — Z79899 Other long term (current) drug therapy: Secondary | ICD-10-CM | POA: Insufficient documentation

## 2023-11-24 DIAGNOSIS — R519 Headache, unspecified: Secondary | ICD-10-CM | POA: Diagnosis present

## 2023-11-24 DIAGNOSIS — J0110 Acute frontal sinusitis, unspecified: Secondary | ICD-10-CM | POA: Insufficient documentation

## 2023-11-24 DIAGNOSIS — Z7982 Long term (current) use of aspirin: Secondary | ICD-10-CM | POA: Insufficient documentation

## 2023-11-24 DIAGNOSIS — I10 Essential (primary) hypertension: Secondary | ICD-10-CM | POA: Insufficient documentation

## 2023-11-24 MED ORDER — CEFPODOXIME PROXETIL 200 MG PO TABS: 200.0000 mg | ORAL_TABLET | Freq: Two times a day (BID) | ORAL | 0 refills | Status: AC

## 2023-11-24 MED ORDER — CLINDAMYCIN HCL 150 MG PO CAPS: 300.0000 mg | ORAL_CAPSULE | Freq: Four times a day (QID) | ORAL | 0 refills | Status: AC

## 2023-11-24 NOTE — ED Triage Notes (Signed)
 He reports having been diagnosed with sinus infection some 2 weeks ago. He had finished his antibiotic and is here d/t persistent (mainly left-sided) frontal facial area h/a. He is ambulatory and in no distress.

## 2023-11-24 NOTE — ED Provider Notes (Signed)
 [place] EMERGENCY DEPARTMENT AT [facility] Provider Note   CSN: [id number] Arrival date & time: 11/24/23  9144     Patient presents with: No chief complaint on file.   Michael Bryant is a 89 y.o. male.   HPI     89 year old male with a history hypertension, coronary artery disease, hyperlipidemia diagnosis of sinus infection August 31 and placed on Augmentin , who presents with concern for frontal headache.   Can't sleep at night laying down, has headache right side of face, around eye. Has not had anything like this since 1962.  Pressure like pain.  Waxing and waning for 3 weeks.  Normally if get abx feels better. Denies numbness, weakness, difficulty talking or walking, visual changes or facial droop.   Fatigue No eye pain Is having runny nose, doesn't drain all the time Not dyspnea at night, feels like can't breathe through nose when laying down because it is stopped up. No leg swelling. No nausea or vomiting  Past Medical History:  Diagnosis Date   CAD (coronary artery disease)    VF arrest >>  s/p CABG (2003) // Myoview  1/15: no ischemia // Cath 4/15: all grafts patent   HTN (hypertension)    Hx of VF arrest in 2003 2003   Hyperlipidemia    NSVT (nonsustained ventricular tachycardia) (HCC) 06/16/2013   Rx with amiodarone  // Echo 6/17: EF 55-60, Gr 1 DD, apical HK     Prior to Admission medications   Medication Sig Start Date End Date Taking? Authorizing Provider  cefpodoxime  (VANTIN ) 200 MG tablet Take 1 tablet (200 mg total) by mouth 2 (two) times daily for 10 days. 11/24/23 12/04/23 Yes Dreama Longs, MD  clindamycin  (CLEOCIN ) 150 MG capsule Take 2 capsules (300 mg total) by mouth every 6 (six) hours for 10 days. 11/24/23 12/04/23 Yes Dreama Longs, MD  amiodarone  (PACERONE ) 200 MG tablet Take 0.5 tablets (100 mg total) by mouth daily. 06/03/23   Croitoru, Mihai, MD  aspirin  EC 81 MG tablet Take 81 mg by mouth daily.    [provider]  beta  carotene w/minerals (OCUVITE) tablet Take 1 tablet by mouth daily.    [provider]  chlorpheniramine (CHLOR-TRIMETON) 4 MG tablet Take 4 mg by mouth daily as needed (allergies).    [provider]  chlorthalidone  (HYGROTON ) 25 MG tablet TAKE 1 TABLET(25 MG) BY MOUTH DAILY 04/15/23   Croitoru, Mihai, MD  Cholecalciferol (VITAMIN D) 50 MCG (2000 UT) CAPS Take 2,000 Units by mouth daily.    [provider]  CINNAMON PO Take 1 tablet by mouth daily.     [provider]  DICLOFENAC  PO Take 100 mg by mouth daily.     [provider]  Diclofenac  Sodium CR 100 MG 24 hr tablet Take 100 mg by mouth daily as needed.    [provider]  fish oil-omega-3 fatty acids 1000 MG capsule Take 1 g by mouth daily.     [provider]  GELATIN PO Take 9 g by mouth daily.    [provider]  Glucosamine 500 MG CAPS daily.    [provider]  Misc Natural Products (PROSTATE HEALTH) CAPS Take 1 capsule by mouth daily.    [provider]  mometasone  (NASONEX ) 50 MCG/ACT nasal spray Place 2 sprays into the nose daily. 11/15/23   Hedges, Reyes, PA-C  nitroGLYCERIN  (NITROSTAT ) 0.4 MG SL tablet Place 1 tablet (0.4 mg total) under the tongue every 5 (five) minutes as  needed for chest pain. 02/27/21   Croitoru, Mihai, MD  omeprazole (PRILOSEC) 10 MG capsule Take 10 mg by mouth daily.    [provider]  Polyethyl Glycol-Propyl Glycol (SYSTANE OP) Place 1 drop into both eyes daily as needed (dry eyes).    [provider]  simvastatin  (ZOCOR ) 20 MG tablet Take 1 tablet (20 mg total) by mouth daily at 6 PM. 02/12/23   Croitoru, Mihai, MD  tamsulosin  (FLOMAX ) 0.4 MG CAPS capsule Take 0.4 mg by mouth daily after supper.  03/02/13   [provider]  valsartan  (DIOVAN ) 160 MG tablet TAKE 1 TABLET(160 MG) BY MOUTH DAILY 04/29/23   Croitoru, Mihai, MD    Allergies: Codeine    Review of Systems  Updated Vital Signs BP  137/75   Pulse (!) 58   Temp 97.8 F (36.6 C) (Oral)   Resp 16   SpO2 97%   Physical Exam Constitutional:      General: He is not in acute distress.    Appearance: Normal appearance. He is not ill-appearing.  HENT:     Head: Normocephalic and atraumatic.     Comments: Tenderness bilateral sinus Eyes:     General: No visual field deficit.    Extraocular Movements: Extraocular movements intact.     Conjunctiva/sclera: Conjunctivae normal.     Pupils: Pupils are equal, round, and reactive to light.  Cardiovascular:     Rate and Rhythm: Normal rate and regular rhythm.     Pulses: Normal pulses.  Pulmonary:     Effort: Pulmonary effort is normal. No respiratory distress.  Musculoskeletal:        General: No swelling or tenderness.     Cervical back: Normal range of motion.  Skin:    General: Skin is warm and dry.     Findings: No erythema or rash.  Neurological:     General: No focal deficit present.     Mental Status: He is alert and oriented to person, place, and time.     GCS: GCS eye subscore is 4. GCS verbal subscore is 5. GCS motor subscore is 6.     Cranial Nerves: No cranial nerve deficit, dysarthria or facial asymmetry.     Sensory: No sensory deficit.     Motor: No weakness or tremor.     Coordination: Coordination normal. Finger-Nose-Finger Test normal.     Gait: Gait normal.     (all labs ordered are listed, but only abnormal results are displayed) Labs Reviewed - No data to display  EKG: None  Radiology: CT Maxillofacial Wo Contrast Result Date: 11/24/2023 CLINICAL DATA:  89 year old male with persistent left side headache and sinus issues for 1 month, not improved with antibiotics. EXAM: CT MAXILLOFACIAL WITHOUT CONTRAST TECHNIQUE: Multidetector CT imaging of the maxillofacial structures was performed. Multiplanar CT image reconstructions were also generated. RADIATION DOSE REDUCTION: This exam was performed according to the departmental dose-optimization  program which includes automated exposure control, adjustment of the mA and/or kV according to patient size and/or use of iterative reconstruction technique. COMPARISON:  Head CT today reported separately. FINDINGS: Osseous: Mandible intact and normally located. Left maxillary posterior molar periapical lucency which communicates with the alveolar recess of the left maxillary sinus on series 9, image 65. Otherwise bilateral maxilla intact. Bilateral zygoma, pterygoid, nasal bones intact. Visible skull base and cervical vertebrae appear intact and aligned. Widespread severe chronic appearing cervical spine degeneration beginning at C2-C3. Multilevel vacuum disc. Orbits: Intact orbital walls. Postoperative changes to both  globes. Intraorbital soft tissues are normal. Sinuses: Subtotal bilateral paranasal sinus opacification with some hyperdense material scattered in the affected paranasal sinuses (such as on series 2, image 21). Subtotal opacification of both frontal sinuses. Bilateral frontoethmoidal recesses, bilateral maxillary sinuses, bilateral ilium see completely opacified. Posterior ethmoids are spared.  Sphenoid sinuses are spared. Evidence of pneumatization above the anterior ethmoidal artery notches on coronal image 43. Keros type 2 olfactory fossa. Sellar sphenoid pneumatization pattern. Large right side Onodi cell (coronal image 31) and pneumatization of the right anterior clinoid process. Opacified bilateral nasal cavity middle meatus. Nasal septum intact with mild deviation and spurring. Retained secretions in the inferior meatus. Symmetric nasal cavity mucosal thickening. Olfactory recesses remain clear. Tympanic cavities and mastoids are clear. Soft tissues: Negative visible noncontrast larynx, pharynx, parapharyngeal spaces, retropharyngeal space, sublingual space, submandibular spaces, masticator and parotid spaces. No upper cervical lymphadenopathy. Limited intracranial: Stable to that reported  separately today. IMPRESSION: 1. Rhinosinusitis with extensive bilateral paranasal sinus disease, and features of both odontogenic etiology (left maxillary posterior molar) and possible allergic fungal sinusitis. 2. No other complicating features are identified. Mild nasal septal deviation and spurring. Middle ears and mastoids are clear. 3. Advanced cervical spine degeneration. Electronically Signed   By: VEAR Hurst M.D.   On: 11/24/2023 11:49   CT Head Wo Contrast Result Date: 11/24/2023 CLINICAL DATA:  89 year old male with persistent left side headache and sinus issues for 1 month, not improved with antibiotics. EXAM: CT HEAD WITHOUT CONTRAST TECHNIQUE: Contiguous axial images were obtained from the base of the skull through the vertex without intravenous contrast. RADIATION DOSE REDUCTION: This exam was performed according to the departmental dose-optimization program which includes automated exposure control, adjustment of the mA and/or kV according to patient size and/or use of iterative reconstruction technique. COMPARISON:  Face CT today reported separately. FINDINGS: Brain: Cerebral volume is within normal limits for age. No midline shift, ventriculomegaly, mass effect, evidence of mass lesion, intracranial hemorrhage or evidence of cortically based acute infarction. Mild for age patchy cerebral white matter hypodensity. Small but circumscribed and chronic appearing infarct in the left superior cerebellum (coronal image 26). Vascular: Calcified atherosclerosis at the skull base. No suspicious intracranial vascular hyperdensity. Skull: Intact.  No acute osseous abnormality identified. Sinuses/Orbits: Subtotal paranasal sinus opacification, see face CT today reported separately. Tympanic cavities and mastoids are well aerated. Other: Postoperative changes to the globes. Visualized orbits and scalp soft tissues are within normal limits. IMPRESSION: 1. No acute intracranial abnormality. Mild for age chronic  appearing small vessel ischemia. 2. Nearly pansinus paranasal sinus disease, see Face CT today reported separately. Electronically Signed   By: VEAR Hurst M.D.   On: 11/24/2023 11:43     Procedures   Medications Ordered in the ED - No data to display                                    89 year old male with a history hypertension, coronary artery disease, hyperlipidemia diagnosis of sinus infection August 31 and placed on Augmentin , who presents with concern for frontal headache.  DDx includes SDH, SAH, CVT, mass, sinus disease, glaucoma.  Low clinical suspicion for glaucoma, GCA, CVT, meningitis.  CT Head and Maxillofacial perfromted showing no evidence of ICH but does show extensive rhinosinusitis features of both odontogenic etiology and possible allergic or fugal sinusitis.    Had been trialed on augmentin  previously, will treat now with  cephalosporin and clincamycin given failure of augmentin .  Recommend continuing the nasal sprays he was prescribed at ENT and further discussion of these symptoms. Patient discharged in stable condition with understanding of reasons to return.       Final diagnoses:  Subacute frontal sinusitis  Acute nonintractable headache, unspecified headache type    ED Discharge Orders          Ordered    cefpodoxime  (VANTIN ) 200 MG tablet  2 times daily        11/24/23 1158    clindamycin  (CLEOCIN ) 150 MG capsule  Every 6 hours        11/24/23 1158               Dreama Longs, MD 11/25/23 0045

## 2023-12-11 ENCOUNTER — Encounter (INDEPENDENT_AMBULATORY_CARE_PROVIDER_SITE_OTHER): Payer: Self-pay | Admitting: Physician Assistant

## 2023-12-11 ENCOUNTER — Ambulatory Visit (INDEPENDENT_AMBULATORY_CARE_PROVIDER_SITE_OTHER): Admitting: Physician Assistant

## 2023-12-11 VITALS — BP 122/68 | HR 65 | Temp 97.8°F | Ht 70.0 in | Wt 182.0 lb

## 2023-12-11 DIAGNOSIS — J309 Allergic rhinitis, unspecified: Secondary | ICD-10-CM

## 2023-12-11 DIAGNOSIS — J0100 Acute maxillary sinusitis, unspecified: Secondary | ICD-10-CM

## 2023-12-11 DIAGNOSIS — J329 Chronic sinusitis, unspecified: Secondary | ICD-10-CM

## 2023-12-11 MED ORDER — METHYLPREDNISOLONE 4 MG PO TBPK: ORAL_TABLET | ORAL | 1 refills | Status: DC

## 2023-12-11 NOTE — Progress Notes (Signed)
 Dear Dr. Corlis, Here is my assessment for our mutual patient, Michael Bryant. Thank you for allowing me the opportunity to care for your patient. Please do not hesitate to contact me should you have any other questions. Sincerely, Chyrl Cohen PA-C  Otolaryngology Clinic Note Referring provider: Dr. Corlis HPI:  Michael Bryant is a 89 y.o. male kindly referred by Dr. Corlis   The patient is an 89 year old gentleman seen in our office for follow-up evaluation of sinus disease.  He was last seen in the office on 11/15/2023 for otitis media.  He was recapped on the counter.  Update 12/11/2023   Since his last office visit the patient notes that he had to go to the emergency room.  His daughter is present today and provides nearly all of his history.  She reports that he has had ongoing sinus drainage and pressure for an extended period of time.  She notes that he has been consistently using the Nasonex  that was prescribed, he continues to use over-the-counter antihistamine.  On 11/24/2023 he had to go to the emergency room for increasing bilateral maxillary sinus pressure.  He had CT showing significant rhinosinusitis with extensive bilateral paranasal sinus disease and features of both otogenic etiology and possible allergic fungal sinusitis.  He was placed on clindamycin  and cefpodoxime .  He notes significant improvement in his symptoms.  He notes he feels better today than he did when I saw him on 11/15/2023.  Notes ongoing nasal pressure and congestion.  Never had allergy testing.  She is not diabetic no history of GI bleeding, he reports he has never taken steroids before.   Independent Review of Additional Tests or Records:  ED visit 11/24/2023  CT maxillofacial 11/24/2023 show extensive rhinosinusitis features of both odontogenic etiology and possible allergic or fugal sinusitis.     PMH/Meds/All/SocHx/FamHx/ROS:   Past Medical History:  Diagnosis Date   CAD (coronary artery disease)    VF arrest >>   s/p CABG (2003) // Myoview  1/15: no ischemia // Cath 4/15: all grafts patent   HTN (hypertension)    Hx of VF arrest in 2003 2003   Hyperlipidemia    NSVT (nonsustained ventricular tachycardia) (HCC) 06/16/2013   Rx with amiodarone  // Echo 6/17: EF 55-60, Gr 1 DD, apical HK     Past Surgical History:  Procedure Laterality Date   CARDIAC CATHETERIZATION  12/15/2001   60% proximal LAD narrowing with active mobile thrombosis w/50% sequential areas,50-60% pro   CORONARY ARTERY BYPASS GRAFT  2004   LIMA to LAD, SVG to OM, SVG to RCA   LEFT HEART CATHETERIZATION WITH CORONARY/GRAFT ANGIOGRAM N/A 07/02/2013   Procedure: LEFT HEART CATHETERIZATION WITH EL BILE;  Surgeon: Jerel Balding, MD;  Location: [facility] CATH LAB;  Service: Cardiovascular;  Laterality: N/A;   NM MYOCAR PERF WALL MOTION  12/09/2009   mod. perfusion defect basal inferior and mid inferior regions c/w  infarct/scar, EF 54%   TONSILLECTOMY AND ADENOIDECTOMY      Family History  Problem Relation Age of Onset   Cancer Mother 20   CAD Father 20       Died age 106   Heart disease Father      Social Connections: Not on file      Current Outpatient Medications:    amiodarone  (PACERONE ) 200 MG tablet, Take 0.5 tablets (100 mg total) by mouth daily., Disp: 45 tablet, Rfl: 3   aspirin  EC 81 MG tablet, Take 81 mg by mouth daily., Disp: , Rfl:  beta carotene w/minerals (OCUVITE) tablet, Take 1 tablet by mouth daily., Disp: , Rfl:    chlorpheniramine (CHLOR-TRIMETON) 4 MG tablet, Take 4 mg by mouth daily as needed (allergies)., Disp: , Rfl:    chlorthalidone  (HYGROTON ) 25 MG tablet, TAKE 1 TABLET(25 MG) BY MOUTH DAILY, Disp: 90 tablet, Rfl: 3   Cholecalciferol (VITAMIN D) 50 MCG (2000 UT) CAPS, Take 2,000 Units by mouth daily., Disp: , Rfl:    CINNAMON PO, Take 1 tablet by mouth daily. , Disp: , Rfl:    DICLOFENAC  PO, Take 100 mg by mouth daily. , Disp: , Rfl:    Diclofenac  Sodium CR 100 MG 24 hr tablet, Take 100 mg  by mouth daily as needed., Disp: , Rfl:    fish oil-omega-3 fatty acids 1000 MG capsule, Take 1 g by mouth daily. , Disp: , Rfl:    GELATIN PO, Take 9 g by mouth daily., Disp: , Rfl:    Glucosamine 500 MG CAPS, daily., Disp: , Rfl:    Misc Natural Products (PROSTATE HEALTH) CAPS, Take 1 capsule by mouth daily., Disp: , Rfl:    mometasone  (NASONEX ) 50 MCG/ACT nasal spray, Place 2 sprays into the nose daily., Disp: 1 each, Rfl: 12   nitroGLYCERIN  (NITROSTAT ) 0.4 MG SL tablet, Place 1 tablet (0.4 mg total) under the tongue every 5 (five) minutes as needed for chest pain., Disp: 75 tablet, Rfl: 3   omeprazole (PRILOSEC) 10 MG capsule, Take 10 mg by mouth daily., Disp: , Rfl:    Polyethyl Glycol-Propyl Glycol (SYSTANE OP), Place 1 drop into both eyes daily as needed (dry eyes)., Disp: , Rfl:    simvastatin  (ZOCOR ) 20 MG tablet, Take 1 tablet (20 mg total) by mouth daily at 6 PM., Disp: 90 tablet, Rfl: 3   tamsulosin  (FLOMAX ) 0.4 MG CAPS capsule, Take 0.4 mg by mouth daily after supper. , Disp: , Rfl:    valsartan  (DIOVAN ) 160 MG tablet, TAKE 1 TABLET(160 MG) BY MOUTH DAILY, Disp: 90 tablet, Rfl: 3   Physical Exam:   There were no vitals taken for this visit.  Pertinent Findings  CN II-XII intact Bilateral EAC with minimal cerumen and TM intact with well pneumatized middle ear spaces Anterior rhinoscopy: Septum midline; bilateral inferior turbinates with hypertrophy with edema and boggy turbinates No lesions of oral cavity/oropharynx No obviously palpable neck masses/lymphadenopathy/thyromegaly No respiratory distress or stridor  Seprately Identifiable Procedures:  None  Impression & Plans:  Michael Bryant is a 89 y.o. male with the following   Sinusitis-  89 year old gentleman with chronic sinusitis.  He did have acute worsening and was treated with oral antibiotics which have significant improved symptoms.  His daughter is very adamant that he would need steroids as this has significantly  proved symptoms in the past.  She was requesting intramuscular injection, we do not do that here in the clinic.  I discussed the use of steroids including risks and benefits, they feel the benefits outweigh the potential risks, I am okay with a Medrol  Dosepak.  I discussed treatment options including endoscopy, allergy referral, and more advanced management but they do not want any of the above.  He is not willing to undergo allergy testing, he does not want any surgical intervention.  Hopeful that the oral steroids will help improve his symptoms, he will be able to tolerate nasal steroids as well as antihistamines and provide some relief to his symptoms.  I am happy to see him back in the office for reevaluation at any  point.   - f/u PRN   Thank you for allowing me the opportunity to care for your patient. Please do not hesitate to contact me should you have any other questions.  Sincerely, Chyrl Cohen PA-C [facility] Phone: [phone] Fax: [phone]  12/11/2023, 11:29 AM

## 2023-12-24 ENCOUNTER — Ambulatory Visit (INDEPENDENT_AMBULATORY_CARE_PROVIDER_SITE_OTHER): Admitting: Physician Assistant

## 2023-12-25 ENCOUNTER — Ambulatory Visit (INDEPENDENT_AMBULATORY_CARE_PROVIDER_SITE_OTHER): Admitting: Physician Assistant

## 2023-12-25 ENCOUNTER — Encounter (INDEPENDENT_AMBULATORY_CARE_PROVIDER_SITE_OTHER): Payer: Self-pay | Admitting: Physician Assistant

## 2023-12-25 VITALS — BP 116/62 | HR 67

## 2023-12-25 DIAGNOSIS — J320 Chronic maxillary sinusitis: Secondary | ICD-10-CM

## 2023-12-25 DIAGNOSIS — J329 Chronic sinusitis, unspecified: Secondary | ICD-10-CM | POA: Diagnosis not present

## 2023-12-25 DIAGNOSIS — J309 Allergic rhinitis, unspecified: Secondary | ICD-10-CM

## 2023-12-25 MED ORDER — AZELASTINE HCL 0.1 % NA SOLN: 2.0000 | Freq: Two times a day (BID) | NASAL | 12 refills | Status: AC

## 2023-12-25 NOTE — Progress Notes (Unsigned)
 Dear Dr. Corlis, Here is my assessment for our mutual patient, Michael Bryant. Thank you for allowing me the opportunity to care for your patient. Please do not hesitate to contact me should you have any other questions. Sincerely, Chyrl Cohen PA-C  Otolaryngology Clinic Note Referring provider: Dr. Corlis HPI:  Michael Bryant is a 89 y.o. male kindly referred by Dr. Corlis   The patient is an 89 year old male seen in our office for follow-up evaluation of sinus disease.  The patient was last seen in the office on 12/11/2023.  Below is recap of the encounter.  Update 12/11/2023     Since his last office visit the patient notes that he had to go to the emergency room.  His daughter is present today and provides nearly all of his history.  She reports that he has had ongoing sinus drainage and pressure for an extended period of time.  She notes that he has been consistently using the Nasonex  that was prescribed, he continues to use over-the-counter antihistamine.  On 11/24/2023 he had to go to the emergency room for increasing bilateral maxillary sinus pressure.  He had CT showing significant rhinosinusitis with extensive bilateral paranasal sinus disease and features of both otogenic etiology and possible allergic fungal sinusitis.  He was placed on clindamycin  and cefpodoxime .  He notes significant improvement in his symptoms.  He notes he feels better today than he did when I saw him on 11/15/2023.  Notes ongoing nasal pressure and congestion.  Never had allergy testing.  She is not diabetic no history of GI bleeding, he reports he has never taken steroids before.  Update 12/25/2023.  The patient is accompanied by his other daughter today who has not been present at previous evaluations.  He notes that overall he has been doing better than previously visits.  He notes some continued sinus related complaints, no significant pain, some ongoing postnasal drainage, no fevers or infectious signs or symptoms.  He has  been using the Nasonex .   Independent Review of Additional Tests or Records:  None   PMH/Meds/All/SocHx/FamHx/ROS:   Past Medical History:  Diagnosis Date   CAD (coronary artery disease)    VF arrest >>  s/p CABG (2003) // Myoview  1/15: no ischemia // Cath 4/15: all grafts patent   HTN (hypertension)    Hx of VF arrest in 2003 2003   Hyperlipidemia    NSVT (nonsustained ventricular tachycardia) (HCC) 06/16/2013   Rx with amiodarone  // Echo 6/17: EF 55-60, Gr 1 DD, apical HK     Past Surgical History:  Procedure Laterality Date   CARDIAC CATHETERIZATION  12/15/2001   60% proximal LAD narrowing with active mobile thrombosis w/50% sequential areas,50-60% pro   CORONARY ARTERY BYPASS GRAFT  2004   LIMA to LAD, SVG to OM, SVG to RCA   LEFT HEART CATHETERIZATION WITH CORONARY/GRAFT ANGIOGRAM N/A 07/02/2013   Procedure: LEFT HEART CATHETERIZATION WITH EL BILE;  Surgeon: Jerel Balding, MD;  Location: [facility];  Service: Cardiovascular;  Laterality: N/A;   NM MYOCAR PERF WALL MOTION  12/09/2009   mod. perfusion defect basal inferior and mid inferior regions c/w  infarct/scar, EF 54%   TONSILLECTOMY AND ADENOIDECTOMY      Family History  Problem Relation Age of Onset   Cancer Mother 39   CAD Father 58       Died age 26   Heart disease Father      Social Connections: Not on file      Current  Outpatient Medications:    azelastine (ASTELIN) 0.1 % nasal spray, Place 2 sprays into both nostrils 2 (two) times daily. Use in each nostril as directed, Disp: 30 mL, Rfl: 12   amiodarone  (PACERONE ) 200 MG tablet, Take 0.5 tablets (100 mg total) by mouth daily., Disp: 45 tablet, Rfl: 3   aspirin  EC 81 MG tablet, Take 81 mg by mouth daily., Disp: , Rfl:    beta carotene w/minerals (OCUVITE) tablet, Take 1 tablet by mouth daily., Disp: , Rfl:    chlorpheniramine (CHLOR-TRIMETON) 4 MG tablet, Take 4 mg by mouth daily as needed (allergies)., Disp: , Rfl:    chlorthalidone   (HYGROTON ) 25 MG tablet, TAKE 1 TABLET(25 MG) BY MOUTH DAILY, Disp: 90 tablet, Rfl: 3   Cholecalciferol (VITAMIN D) 50 MCG (2000 UT) CAPS, Take 2,000 Units by mouth daily., Disp: , Rfl:    CINNAMON PO, Take 1 tablet by mouth daily. , Disp: , Rfl:    DICLOFENAC  PO, Take 100 mg by mouth daily. , Disp: , Rfl:    Diclofenac  Sodium CR 100 MG 24 hr tablet, Take 100 mg by mouth daily as needed., Disp: , Rfl:    fish oil-omega-3 fatty acids 1000 MG capsule, Take 1 g by mouth daily. , Disp: , Rfl:    GELATIN PO, Take 9 g by mouth daily., Disp: , Rfl:    Glucosamine 500 MG CAPS, daily., Disp: , Rfl:    methylPREDNISolone  (MEDROL  DOSEPAK) 4 MG TBPK tablet, Take with signs of chronic sinusitis and take as directed, Disp: 1 each, Rfl: 1   Misc Natural Products (PROSTATE HEALTH) CAPS, Take 1 capsule by mouth daily., Disp: , Rfl:    mometasone  (NASONEX ) 50 MCG/ACT nasal spray, Place 2 sprays into the nose daily., Disp: 1 each, Rfl: 12   nitroGLYCERIN  (NITROSTAT ) 0.4 MG SL tablet, Place 1 tablet (0.4 mg total) under the tongue every 5 (five) minutes as needed for chest pain., Disp: 75 tablet, Rfl: 3   omeprazole (PRILOSEC) 10 MG capsule, Take 10 mg by mouth daily., Disp: , Rfl:    Polyethyl Glycol-Propyl Glycol (SYSTANE OP), Place 1 drop into both eyes daily as needed (dry eyes)., Disp: , Rfl:    simvastatin  (ZOCOR ) 20 MG tablet, Take 1 tablet (20 mg total) by mouth daily at 6 PM., Disp: 90 tablet, Rfl: 3   tamsulosin  (FLOMAX ) 0.4 MG CAPS capsule, Take 0.4 mg by mouth daily after supper. , Disp: , Rfl:    valsartan  (DIOVAN ) 160 MG tablet, TAKE 1 TABLET(160 MG) BY MOUTH DAILY, Disp: 90 tablet, Rfl: 3   Physical Exam:   BP 116/62   Pulse 67   SpO2 91%   Pertinent Findings  CN II-XII intact Bilateral EAC with minimal cerumen and TM intact with well pneumatized middle ear spaces Anterior rhinoscopy: Septum midline; bilateral inferior turbinates with hypertrophy with edema and boggy turbinates No lesions of  oral cavity/oropharynx No obviously palpable neck masses/lymphadenopathy/thyromegaly No respiratory distress or stridor  Seprately Identifiable Procedures:  None  Impression & Plans:  Juliocesar Blasius is a 89 y.o. male with the following   Chronic sinus disease-  89 year old gentleman seen in our office for follow-up evaluation for chronic sinus disease.  Overall he appears to be at his baseline with some ongoing postnasal drainage.  I had a very extensive discussion with family on treatment options.  I again discussed the 2 options would be to continue conservative management and maximize medical therapy with daily saline irrigation, continued daily oral antihistamine, Flonase,  and azelastine.  I discussed that if medical management was not successful the other option would be sinus surgery which they again reiterate he would not want to proceed with.  I also discussed it is reasonable to follow-up with allergy and asthma for evaluation of his allergies, previously they reported they would not want to do this, today they agree that this would be reasonable.  I also discussed following up with their dentist to review if there is any odontogenic source of the chronic sinus disease, he notes he has seen a dentist and was told that there was no issue there.  At this point I would continue to recommend medical management, am happy to see them back in the office at any point in the future, they verbalized understanding and agreement to today's plan had no further questions or concerns.   - f/u PRN   Thank you for allowing me the opportunity to care for your patient. Please do not hesitate to contact me should you have any other questions.  Sincerely, Chyrl Cohen PA-C [facility] Phone: [phone] Fax: [phone]  12/25/2023, 9:04 AM

## 2024-01-24 ENCOUNTER — Encounter (INDEPENDENT_AMBULATORY_CARE_PROVIDER_SITE_OTHER): Payer: Self-pay | Admitting: Physician Assistant

## 2024-01-24 ENCOUNTER — Ambulatory Visit (INDEPENDENT_AMBULATORY_CARE_PROVIDER_SITE_OTHER): Admitting: Physician Assistant

## 2024-01-24 VITALS — BP 120/56 | HR 69

## 2024-01-24 DIAGNOSIS — J329 Chronic sinusitis, unspecified: Secondary | ICD-10-CM

## 2024-01-24 DIAGNOSIS — J320 Chronic maxillary sinusitis: Secondary | ICD-10-CM

## 2024-01-24 NOTE — Progress Notes (Signed)
 Dear Dr. Corlis, Here is my assessment for our mutual patient, Michael Bryant. Thank you for allowing me the opportunity to care for your patient. Please do not hesitate to contact me should you have any other questions. Sincerely, Chyrl Cohen PA-C  Otolaryngology Clinic Note Referring provider: Dr. Corlis HPI:  Michael Bryant is a 89 y.o. male kindly referred by Dr. Corlis   History of Present Illness  This is an 89 year old gentleman seen in our office for follow-up evaluation of chronic sinus disease.  He was last seen in the office on 12/25/2023.  Since that time he notes no significant changes in his bilateral nasal congestion.  He notes it does fluctuate but has been for an extended period of time.  He notes that he has not been consistently using the prescribed medications which include Flonase, azelastine, nasal saline irrigations.  He denies any infectious signs or symptoms, no purulence, no fever, no unilateral pressure or pain.  He is companied by his wife today who notes he is inconsistent with use of medications.  At his last office visit in those previous to that we discussed his chronic sinus disease noted on history and imaging.  They do not want to proceed with any surgical intervention and do not want to see allergist for testing.      Independent Review of Additional Tests or Records:  None   PMH/Meds/All/SocHx/FamHx/ROS:   Past Medical History:  Diagnosis Date   CAD (coronary artery disease)    VF arrest >>  s/p CABG (2003) // Myoview  1/15: no ischemia // Cath 4/15: all grafts patent   HTN (hypertension)    Hx of VF arrest in 2003 2003   Hyperlipidemia    NSVT (nonsustained ventricular tachycardia) (HCC) 06/16/2013   Rx with amiodarone  // Echo 6/17: EF 55-60, Gr 1 DD, apical HK     Past Surgical History:  Procedure Laterality Date   CARDIAC CATHETERIZATION  12/15/2001   60% proximal LAD narrowing with active mobile thrombosis w/50% sequential areas,50-60% pro   CORONARY  ARTERY BYPASS GRAFT  2004   LIMA to LAD, SVG to OM, SVG to RCA   LEFT HEART CATHETERIZATION WITH CORONARY/GRAFT ANGIOGRAM N/A 07/02/2013   Procedure: LEFT HEART CATHETERIZATION WITH EL BILE;  Surgeon: Jerel Balding, MD;  Location: [facility];  Service: Cardiovascular;  Laterality: N/A;   NM MYOCAR PERF WALL MOTION  12/09/2009   mod. perfusion defect basal inferior and mid inferior regions c/w  infarct/scar, EF 54%   TONSILLECTOMY AND ADENOIDECTOMY      Family History  Problem Relation Age of Onset   Cancer Mother 58   CAD Father 37       Died age 32   Heart disease Father      Social Connections: Not on file      Current Outpatient Medications:    amiodarone  (PACERONE ) 200 MG tablet, Take 0.5 tablets (100 mg total) by mouth daily., Disp: 45 tablet, Rfl: 3   aspirin  EC 81 MG tablet, Take 81 mg by mouth daily., Disp: , Rfl:    azelastine (ASTELIN) 0.1 % nasal spray, Place 2 sprays into both nostrils 2 (two) times daily. Use in each nostril as directed, Disp: 30 mL, Rfl: 12   beta carotene w/minerals (OCUVITE) tablet, Take 1 tablet by mouth daily., Disp: , Rfl:    chlorpheniramine (CHLOR-TRIMETON) 4 MG tablet, Take 4 mg by mouth daily as needed (allergies)., Disp: , Rfl:    chlorthalidone  (HYGROTON ) 25 MG tablet, TAKE 1  TABLET(25 MG) BY MOUTH DAILY, Disp: 90 tablet, Rfl: 3   Cholecalciferol (VITAMIN D) 50 MCG (2000 UT) CAPS, Take 2,000 Units by mouth daily., Disp: , Rfl:    CINNAMON PO, Take 1 tablet by mouth daily. , Disp: , Rfl:    DICLOFENAC  PO, Take 100 mg by mouth daily. , Disp: , Rfl:    Diclofenac  Sodium CR 100 MG 24 hr tablet, Take 100 mg by mouth daily as needed., Disp: , Rfl:    fish oil-omega-3 fatty acids 1000 MG capsule, Take 1 g by mouth daily. , Disp: , Rfl:    GELATIN PO, Take 9 g by mouth daily., Disp: , Rfl:    Glucosamine 500 MG CAPS, daily., Disp: , Rfl:    methylPREDNISolone  (MEDROL  DOSEPAK) 4 MG TBPK tablet, Take with signs of chronic sinusitis  and take as directed, Disp: 1 each, Rfl: 1   Misc Natural Products (PROSTATE HEALTH) CAPS, Take 1 capsule by mouth daily., Disp: , Rfl:    mometasone  (NASONEX ) 50 MCG/ACT nasal spray, Place 2 sprays into the nose daily., Disp: 1 each, Rfl: 12   nitroGLYCERIN  (NITROSTAT ) 0.4 MG SL tablet, Place 1 tablet (0.4 mg total) under the tongue every 5 (five) minutes as needed for chest pain., Disp: 75 tablet, Rfl: 3   omeprazole (PRILOSEC) 10 MG capsule, Take 10 mg by mouth daily., Disp: , Rfl:    Polyethyl Glycol-Propyl Glycol (SYSTANE OP), Place 1 drop into both eyes daily as needed (dry eyes)., Disp: , Rfl:    simvastatin  (ZOCOR ) 20 MG tablet, Take 1 tablet (20 mg total) by mouth daily at 6 PM., Disp: 90 tablet, Rfl: 3   tamsulosin  (FLOMAX ) 0.4 MG CAPS capsule, Take 0.4 mg by mouth daily after supper. , Disp: , Rfl:    valsartan  (DIOVAN ) 160 MG tablet, TAKE 1 TABLET(160 MG) BY MOUTH DAILY, Disp: 90 tablet, Rfl: 3   Physical Exam:   BP (!) 120/56   Pulse 69   SpO2 96%   Pertinent Findings  CN II-XII intact Bilateral EAC with minimal cerumen and TM intact with well pneumatized middle ear spaces Anterior rhinoscopy: Septum midline; bilateral inferior turbinates with hypertrophy with edema and boggy turbinates, no purulence No lesions of oral cavity/oropharynx No obviously palpable neck masses/lymphadenopathy/thyromegaly No respiratory distress or stridor  Physical Exam     Seprately Identifiable Procedures:  None  Impression & Plans:  Michael Bryant is a 89 y.o. male with the following   Assessment and Plan Assessment & Plan   Chronic sinus disease-  89 year old gentleman seen again today for chronic sinus disease.  No significant overall changes.  He has been inconsistent with the use of medication.  I discussed with him and his wife that in order to help manage the disease they would need to attempt medical management with consistent use of the Flonase, azelastine, nasal saline  irrigations.  I revisited our previous conversations and that he does have chronic sinus disease but that they do not wish to proceed with any surgical intervention which I think is reasonable given his age.  At this point I do not have any further treatments other than the recommendation to remain consistent with medical management.  The patient and his wife verbalized understanding and agreement to today's plan had no further questions or concerns.   - f/u no specific follow-up needed   Thank you for allowing me the opportunity to care for your patient. Please do not hesitate to contact me should you have any  other questions.  Sincerely, Chyrl Cohen PA-C [facility] Phone: [phone] Fax: [phone]  01/24/2024, 8:46 AM

## 2024-02-27 ENCOUNTER — Emergency Department (HOSPITAL_BASED_OUTPATIENT_CLINIC_OR_DEPARTMENT_OTHER)

## 2024-02-27 ENCOUNTER — Emergency Department (HOSPITAL_BASED_OUTPATIENT_CLINIC_OR_DEPARTMENT_OTHER): Admitting: Radiology

## 2024-02-27 ENCOUNTER — Other Ambulatory Visit: Payer: Self-pay

## 2024-02-27 ENCOUNTER — Emergency Department (HOSPITAL_BASED_OUTPATIENT_CLINIC_OR_DEPARTMENT_OTHER)
Admission: EM | Admit: 2024-02-27 | Discharge: 2024-02-27 | Disposition: A | Discharge status: Home / Self Care | Attending: Emergency Medicine | Admitting: Emergency Medicine

## 2024-02-27 DIAGNOSIS — R079 Chest pain, unspecified: Secondary | ICD-10-CM | POA: Diagnosis not present

## 2024-02-27 DIAGNOSIS — Z951 Presence of aortocoronary bypass graft: Secondary | ICD-10-CM | POA: Diagnosis not present

## 2024-02-27 DIAGNOSIS — R0989 Other specified symptoms and signs involving the circulatory and respiratory systems: Secondary | ICD-10-CM | POA: Diagnosis not present

## 2024-02-27 DIAGNOSIS — R8289 Other abnormal findings on cytological and histological examination of urine: Secondary | ICD-10-CM | POA: Diagnosis not present

## 2024-02-27 DIAGNOSIS — Z7982 Long term (current) use of aspirin: Secondary | ICD-10-CM | POA: Insufficient documentation

## 2024-02-27 DIAGNOSIS — J342 Deviated nasal septum: Secondary | ICD-10-CM | POA: Diagnosis not present

## 2024-02-27 DIAGNOSIS — R0602 Shortness of breath: Secondary | ICD-10-CM | POA: Insufficient documentation

## 2024-02-27 DIAGNOSIS — K056 Periodontal disease, unspecified: Secondary | ICD-10-CM | POA: Diagnosis not present

## 2024-02-27 DIAGNOSIS — R531 Weakness: Secondary | ICD-10-CM | POA: Diagnosis not present

## 2024-02-27 DIAGNOSIS — D75839 Thrombocytosis, unspecified: Secondary | ICD-10-CM | POA: Insufficient documentation

## 2024-02-27 DIAGNOSIS — R519 Headache, unspecified: Secondary | ICD-10-CM | POA: Diagnosis present

## 2024-02-27 DIAGNOSIS — D649 Anemia, unspecified: Secondary | ICD-10-CM | POA: Diagnosis not present

## 2024-02-27 LAB — COMPREHENSIVE METABOLIC PANEL WITH GFR
ALT: 60 U/L — ABNORMAL HIGH (ref 0–44)
AST: 36 U/L (ref 15–41)
Albumin: 3.3 g/dL — ABNORMAL LOW (ref 3.5–5.0)
Alkaline Phosphatase: 134 U/L — ABNORMAL HIGH (ref 38–126)
Anion gap: 14 (ref 5–15)
BUN: 28 mg/dL — ABNORMAL HIGH (ref 8–23)
CO2: 25 mmol/L (ref 22–32)
Calcium: 9.2 mg/dL (ref 8.9–10.3)
Chloride: 96 mmol/L — ABNORMAL LOW (ref 98–111)
Creatinine, Ser: 1.3 mg/dL — ABNORMAL HIGH (ref 0.61–1.24)
GFR, Estimated: 53 mL/min — ABNORMAL LOW (ref >60)
Glucose, Bld: 251 mg/dL — ABNORMAL HIGH (ref 70–99)
Potassium: 4.5 mmol/L (ref 3.5–5.1)
Sodium: 135 mmol/L (ref 135–145)
Total Bilirubin: 0.6 mg/dL (ref 0.0–1.2)
Total Protein: 7.3 g/dL (ref 6.5–8.1)

## 2024-02-27 LAB — VITAMIN B12: Vitamin B-12: 1626 pg/mL — ABNORMAL HIGH (ref 180–914)

## 2024-02-27 LAB — URINALYSIS, ROUTINE W REFLEX MICROSCOPIC
Bacteria, UA: NONE SEEN
Bilirubin Urine: NEGATIVE
Glucose, UA: NEGATIVE mg/dL
Hgb urine dipstick: NEGATIVE
Leukocytes,Ua: NEGATIVE
Nitrite: NEGATIVE
Protein, ur: 30 mg/dL — AB
Specific Gravity, Urine: 1.029 (ref 1.005–1.030)
pH: 5.5 (ref 5.0–8.0)

## 2024-02-27 LAB — DIFFERENTIAL
Abs Immature Granulocytes: 0.09 K/uL — ABNORMAL HIGH (ref 0.00–0.07)
Basophils Absolute: 0.1 K/uL (ref 0.0–0.1)
Basophils Relative: 0 %
Eosinophils Absolute: 0.1 K/uL (ref 0.0–0.5)
Eosinophils Relative: 1 %
Immature Granulocytes: 1 %
Lymphocytes Relative: 11 %
Lymphs Abs: 1.3 K/uL (ref 0.7–4.0)
Monocytes Absolute: 1.2 K/uL — ABNORMAL HIGH (ref 0.1–1.0)
Monocytes Relative: 10 %
Neutro Abs: 9.4 K/uL — ABNORMAL HIGH (ref 1.7–7.7)
Neutrophils Relative %: 77 %

## 2024-02-27 LAB — CBC
HCT: 29.2 % — ABNORMAL LOW (ref 39.0–52.0)
Hemoglobin: 9.9 g/dL — ABNORMAL LOW (ref 13.0–17.0)
MCH: 30.1 pg (ref 26.0–34.0)
MCHC: 33.9 g/dL (ref 30.0–36.0)
MCV: 88.8 fL (ref 80.0–100.0)
Platelets: 639 K/uL — ABNORMAL HIGH (ref 150–400)
RBC: 3.29 MIL/uL — ABNORMAL LOW (ref 4.22–5.81)
RDW: 13.8 % (ref 11.5–15.5)
WBC: 12.1 K/uL — ABNORMAL HIGH (ref 4.0–10.5)
nRBC: 0 % (ref 0.0–0.2)

## 2024-02-27 LAB — RETICULOCYTES
Immature Retic Fract: 19.9 % — ABNORMAL HIGH (ref 2.3–15.9)
RBC.: 3.38 MIL/uL — ABNORMAL LOW (ref 4.22–5.81)
Retic Count, Absolute: 50.4 K/uL (ref 19.0–186.0)
Retic Ct Pct: 1.5 % (ref 0.4–3.1)

## 2024-02-27 LAB — RESP PANEL BY RT-PCR (RSV, FLU A&B, COVID)  RVPGX2
Influenza A by PCR: NEGATIVE
Influenza B by PCR: NEGATIVE
Resp Syncytial Virus by PCR: NEGATIVE
SARS Coronavirus 2 by RT PCR: NEGATIVE

## 2024-02-27 LAB — TROPONIN T, HIGH SENSITIVITY
Troponin T High Sensitivity: 28 ng/L — ABNORMAL HIGH (ref 0–19)
Troponin T High Sensitivity: 29 ng/L — ABNORMAL HIGH (ref 0–19)

## 2024-02-27 LAB — IRON AND TIBC
Iron: 30 ug/dL — ABNORMAL LOW (ref 45–182)
Saturation Ratios: 12 % — ABNORMAL LOW (ref 17.9–39.5)
TIBC: 256 ug/dL (ref 250–450)
UIBC: 226 ug/dL

## 2024-02-27 LAB — TSH: TSH: 2.39 u[IU]/mL (ref 0.350–4.500)

## 2024-02-27 LAB — FERRITIN: Ferritin: 251 ng/mL (ref 24–336)

## 2024-02-27 LAB — PRO BRAIN NATRIURETIC PEPTIDE: Pro Brain Natriuretic Peptide: 621 pg/mL — ABNORMAL HIGH (ref <300.0)

## 2024-02-27 LAB — FOLATE: Folate: 13.2 ng/mL (ref >5.9)

## 2024-02-27 LAB — CBG MONITORING, ED: Glucose-Capillary: 222 mg/dL — ABNORMAL HIGH (ref 70–99)

## 2024-02-27 LAB — T4, FREE: Free T4: 1.61 ng/dL (ref 0.80–2.00)

## 2024-02-27 NOTE — ED Notes (Signed)
 Blood obtained in Triage.[email or web address]

## 2024-02-27 NOTE — ED Triage Notes (Signed)
 Pt arrived POV with family c/o congestion x2 mo, however over the past week has been feeling worse, cold sweats, 2 episodes of CP over the past 2 days denies at present.

## 2024-02-27 NOTE — Discharge Instructions (Signed)
 1.  Is very important you follow-up with your doctor and get an appointment with a hematologist.  Your blood count has dropped since it was last checked in 2022 and you have anemia that is likely contributing to symptoms of general fatigue. 2.  At the time of your exam today there is no obvious source of infection.  Continue to monitor closely for fevers or other symptoms.  Currently your chest x-ray does not show pneumonia and your urinalysis does not show urinary tract infection. 3.  There is additional lab work done in the emergency department that will not be completed until later.  You may follow-up with this in your MyChart and you need to have your physician review it.

## 2024-02-27 NOTE — ED Notes (Signed)
 DC paperwork given and verbally understood.

## 2024-02-27 NOTE — ED Provider Notes (Signed)
 [place] EMERGENCY DEPARTMENT AT [facility] Provider Note   CSN: [id number] Arrival date & time: 02/27/24  9247     Patient presents with: Chest Pain   Michael Bryant is a 89 y.o. male.   HPI Patient has had persistent symptoms of worsening generalized weakness and fatigue over several months.  Symptoms have worsened over the past few days to weeks.  Patient was diagnosed with a sinus infection a couple months ago and treated with 2 rounds of antibiotics.  He never seems to have really improved much since that time.  Notably the amount of nasal or sinus congestion does seem improved but the patient still periodically experiencing some headache although he denies any at this time.  He has not had fevers.  He has had follow-up with ENT and they do not advise any further treatment.  Patient has noted some cough which he associated with his sinus symptoms.  This has not been persistent or worsening.  Patient reports a couple times he has had a very brief chest pain about 30 seconds.  He has not had any associated syncope or near syncope.     Prior to Admission medications  Medication Sig Start Date End Date Taking? Authorizing Provider  amiodarone  (PACERONE ) 200 MG tablet Take 0.5 tablets (100 mg total) by mouth daily. 06/03/23   Croitoru, Mihai, MD  aspirin  EC 81 MG tablet Take 81 mg by mouth daily.    [provider]  azelastine  (ASTELIN ) 0.1 % nasal spray Place 2 sprays into both nostrils 2 (two) times daily. Use in each nostril as directed 12/25/23   Hedges, Reyes, PA-C  beta carotene w/minerals (OCUVITE) tablet Take 1 tablet by mouth daily.    [provider]  chlorpheniramine (CHLOR-TRIMETON) 4 MG tablet Take 4 mg by mouth daily as needed (allergies).    [provider]  chlorthalidone  (HYGROTON ) 25 MG tablet TAKE 1 TABLET(25 MG) BY MOUTH DAILY 04/15/23   Croitoru, Mihai, MD  Cholecalciferol (VITAMIN D) 50 MCG (2000 UT) CAPS Take 2,000 Units by mouth  daily.    [provider]  CINNAMON PO Take 1 tablet by mouth daily.     [provider]  DICLOFENAC  PO Take 100 mg by mouth daily.     [provider]  Diclofenac  Sodium CR 100 MG 24 hr tablet Take 100 mg by mouth daily as needed.    [provider]  fish oil-omega-3 fatty acids 1000 MG capsule Take 1 g by mouth daily.     [provider]  GELATIN PO Take 9 g by mouth daily.    [provider]  Glucosamine 500 MG CAPS daily.    [provider]  methylPREDNISolone  (MEDROL  DOSEPAK) 4 MG TBPK tablet Take with signs of chronic sinusitis and take as directed 12/11/23   Palmer Reyes, PA-C  Misc Natural Products (PROSTATE HEALTH) CAPS Take 1 capsule by mouth daily.    [provider]  mometasone  (NASONEX ) 50 MCG/ACT nasal spray Place 2 sprays into the nose daily. 11/15/23   Hedges, Reyes, PA-C  nitroGLYCERIN  (NITROSTAT ) 0.4 MG SL tablet Place 1 tablet (0.4 mg total) under the tongue every 5 (five) minutes as needed for chest pain. 02/27/21   Croitoru, Mihai, MD  omeprazole (PRILOSEC) 10 MG capsule Take 10 mg by mouth daily.    [provider]  Polyethyl Glycol-Propyl Glycol (SYSTANE OP) Place 1 drop into both eyes daily as needed (dry eyes).    [provider]  simvastatin  (ZOCOR ) 20 MG tablet Take 1 tablet (20 mg total) by mouth daily at 6 PM. 02/12/23   Croitoru, Mihai, MD  tamsulosin  (FLOMAX ) 0.4 MG CAPS capsule Take 0.4 mg by mouth daily after supper.  03/02/13   [provider]  valsartan  (DIOVAN ) 160 MG tablet TAKE 1 TABLET(160 MG) BY MOUTH DAILY 04/29/23   Croitoru, Jerel, MD    Allergies: Codeine    Review of Systems  Updated Vital Signs BP (!) 140/61   Pulse 60   Temp 97.6 F (36.4 C) (Oral)   Resp 18   Ht 5' 10 (1.778 m)   Wt 83.9 kg   SpO2 98%   BMI 26.54 kg/m   Physical Exam Constitutional:      Comments: Alert with clear mental status.  No respiratory distress.   Well-nourished well-developed  HENT:     Head: Normocephalic and atraumatic.     Right Ear: Tympanic membrane normal.     Left Ear: Tympanic membrane normal.     Nose: Nose normal.     Mouth/Throat:     Mouth: Mucous membranes are moist.     Pharynx: Oropharynx is clear.  Cardiovascular:     Rate and Rhythm: Normal rate and regular rhythm.  Pulmonary:     Effort: Pulmonary effort is normal.     Breath sounds: Normal breath sounds.  Abdominal:     General: There is no distension.     Palpations: Abdomen is soft.     Tenderness: There is no abdominal tenderness. There is no guarding.  Musculoskeletal:        General: No tenderness. Normal range of motion.     Cervical back: Neck supple.     Right lower leg: No edema.     Left lower leg: No edema.  Skin:    General: Skin is warm and dry.  Neurological:     General: No focal deficit present.     Mental Status: He is oriented to person, place, and time.     Motor: No weakness.     Coordination: Coordination normal.  Psychiatric:        Mood and Affect: Mood normal.     (all labs ordered are listed, but only abnormal results are displayed) Labs Reviewed  COMPREHENSIVE METABOLIC PANEL WITH GFR - Abnormal; Notable for the following components:      Result Value   Chloride 96 (*)    Glucose, Bld 251 (*)    BUN 28 (*)    Creatinine, Ser 1.30 (*)    Albumin 3.3 (*)    ALT 60 (*)    Alkaline Phosphatase 134 (*)    GFR, Estimated 53 (*)    All other components within normal limits  CBC - Abnormal; Notable for the following components:   WBC 12.1 (*)    RBC 3.29 (*)    Hemoglobin 9.9 (*)    HCT 29.2 (*)    Platelets 639 (*)    All other components within normal limits  URINALYSIS, ROUTINE W REFLEX MICROSCOPIC - Abnormal; Notable for the following components:   Ketones, ur TRACE (*)    Protein, ur 30 (*)    All other components within normal limits  PRO BRAIN NATRIURETIC PEPTIDE - Abnormal; Notable for the following  components:   Pro Brain Natriuretic Peptide 621.0 (*)    All other components within normal limits  RETICULOCYTES - Abnormal; Notable for the following components:   RBC. 3.38 (*)    Immature  Retic Fract 19.9 (*)    All other components within normal limits  DIFFERENTIAL - Abnormal; Notable for the following components:   Neutro Abs 9.4 (*)    Monocytes Absolute 1.2 (*)    Abs Immature Granulocytes 0.09 (*)    All other components within normal limits  CBG MONITORING, ED - Abnormal; Notable for the following components:   Glucose-Capillary 222 (*)    All other components within normal limits  TROPONIN T, HIGH SENSITIVITY - Abnormal; Notable for the following components:   Troponin T High Sensitivity 28 (*)    All other components within normal limits  TROPONIN T, HIGH SENSITIVITY - Abnormal; Notable for the following components:   Troponin T High Sensitivity 29 (*)    All other components within normal limits  RESP PANEL BY RT-PCR (RSV, FLU A&B, COVID)  [id number]  TSH  T3, FREE  T4, FREE  VITAMIN B12  FOLATE  IRON AND TIBC  FERRITIN  CBG MONITORING, ED    EKG: EKG Interpretation Date/Time:  Thursday February 27 2024 08:18:11 EST Ventricular Rate:  77 PR Interval:  210 QRS Duration:  88 QT Interval:  380 QTC Calculation: 430 R Axis:   51  Text Interpretation: Sinus rhythm with 1st degree A-V block Anterior infarct (cited on or before 29-Mar-2023) Abnormal ECG When compared with ECG of 29-Mar-2023 10:59, Vent. rate has increased BY  25 BPM Borderline criteria for Inferior infarct are no longer Present Questionable change in initial forces of Anterior leads Confirmed by Armenta Canning [phone]) on 02/27/2024 8:50:00 AM  Radiology: CT Maxillofacial WO CM Result Date: 02/27/2024 CLINICAL DATA:  Persistent headache after antibiotic treatment. Evaluate for sinus infection. EXAM: CT MAXILLOFACIAL WITHOUT CONTRAST TECHNIQUE: Multidetector CT imaging of the maxillofacial structures  was performed. Multiplanar CT image reconstructions were also generated. RADIATION DOSE REDUCTION: This exam was performed according to the departmental dose-optimization program which includes automated exposure control, adjustment of the mA and/or kV according to patient size and/or use of iterative reconstruction technique. COMPARISON:  11/24/2023 FINDINGS: Osseous: No fracture or mandibular dislocation. No destructive process. Mild periodontal disease involving the posterior left upper and lower molar teeth. Orbits: Negative. No traumatic or inflammatory finding. Sinuses: Paranasal sinuses are well developed. There is complete opacification of the frontal maxillary sinuses with moderate opacification over the ethmoid air cells and opacification of the ostiomeatal complexes bilaterally. These findings are unchanged. Sphenoid sinuses clear. Mastoid air cells are clear. Minimal deviation of the nasal septum to the right. Mild associated mucoperiosteal thickening. Soft tissues: Negative. Limited intracranial: No significant or unexpected finding. IMPRESSION: 1. Moderate chronic sinus inflammatory disease as described without significant change. 2. Mild periodontal disease involving the posterior left upper and lower molar teeth. Electronically Signed   By: Toribio Agreste M.D.   On: 02/27/2024 10:14   DG Chest 2 View Result Date: 02/27/2024 CLINICAL DATA:  Chest congestion for 2 months.  Chest pain. EXAM: CHEST - 2 VIEW COMPARISON:  February 01, 2019. FINDINGS: The heart size and mediastinal contours are within normal limits. Status post coronary artery bypass graft. Both lungs are clear. The visualized skeletal structures are unremarkable. IMPRESSION: No active cardiopulmonary disease. Electronically Signed   By: Lynwood Landy Raddle M.D.   On: 02/27/2024 10:09     Procedures   Medications Ordered in the ED - No data to display  Clinical Course as of 02/27/24 1341  Thu Feb 27, 2024  1329 Reticulocytes(!)  [MP]    Clinical Course User Index [MP]  Armenta Canning, MD                                 Medical Decision Making Amount and/or Complexity of Data Reviewed Labs: ordered. Radiology: ordered.   Patient presents as outlined. Differential diagnosis includes metabolic derangement\infectious etiology\ACS.  Will proceed with broad diagnostic evaluation.   Respiratory panel negative for influenza.  Urinalysis negative nitrate negative leuk esterase 0-5 WBC 0-5 RBC  White count 12.1 hemoglobin 9.9 platelets 639  Troponin flat at 27, 29.  Pro BNP 621 EKG reviewed by myself no acute changes from previous.  Metabolic panel GFR 53 blood glucose 251 TSH 2.3  Chest x-ray interpreted radiology no acute findings.  At this time symptoms appear lower probability to be secondary to ACS.  Patient describes very fleeting chest discomfort and otherwise does not have typical ACS symptoms.  Chest x-ray does not show volume overload, BNP is in normal range for age adjustment, troponins are flat.  Patient does have anemia at hemoglobin of 9.9 gram per deciliter.  This is a fairly significant change compared to the patient's known baseline which is from July 2022 which was at 14.4.  Also patient has thrombocytosis that is new for him.  Clinically no signs of acute infectious etiology to treat.  Patient has been treated for sinusitis.  I have repeated maxillary sinus CT which is stable in appearance.  Patient had antibiotics and symptomatically improved from this.  As well patient has had ENT follow-up and was not advised for any additional treatment or intervention.  Patient is not having worsening headache, fever or objective symptoms thus I cannot currently conclude that this is the cause of symptoms.  No other focal clinical symptoms of infectious source.  Chest x-ray does not show pneumonia, urinalysis negative, patient is not suffering from abdominal pain to suggest an intra-abdominal process.  He is not  having fevers.  I do feel it is very important given this information the patient gets follow-up with PCP and likely referral to hematology for further evaluation newly identified anemia and thrombocytosis.  Patient does not endorse any positive symptoms for blood loss anemia.  Patient's vital signs are stable.  He does not have tachycardia or hypotension.  He does not have fever or hypoxia.  At this time I do feel that he is stable for discharge and proceeding with additional outpatient workup for the above identified problems.  I did extensively review other home issues with the patient's daughters.  We discussed that is okay to start an over-the-counter iron supplement but will not go with high-dose iron at this point in time.  Also patient's other daughter advised she has observed symptoms that are consistent with anxiety and depression.  Notably, patient is not endorsing the symptoms when it is addressed but likely suffering from some of the symptoms as well.  This is apparently been difficult to address in the outpatient setting without the patient agreeing to discuss these issues.      Final diagnoses:  Generalized weakness  Anemia, unspecified type  Thrombocytosis    ED Discharge Orders     None          Armenta Canning, MD 02/27/24 1341

## 2024-02-28 LAB — T3, FREE: T3, Free: 3 pg/mL (ref 2.0–4.4)

## 2024-03-02 ENCOUNTER — Other Ambulatory Visit: Payer: Self-pay

## 2024-03-02 ENCOUNTER — Other Ambulatory Visit: Payer: Self-pay | Admitting: Cardiovascular Disease

## 2024-03-02 MED ORDER — SIMVASTATIN 20 MG PO TABS: 20.0000 mg | ORAL_TABLET | Freq: Every day | ORAL | 0 refills | Status: DC

## 2024-03-03 ENCOUNTER — Ambulatory Visit: Admitting: Allergy

## 2024-03-03 ENCOUNTER — Other Ambulatory Visit: Payer: Self-pay

## 2024-03-03 ENCOUNTER — Encounter: Payer: Self-pay | Admitting: Allergy

## 2024-03-03 VITALS — BP 118/70 | HR 73 | Temp 98.0°F | Resp 18 | Ht 70.0 in | Wt 180.0 lb

## 2024-03-03 DIAGNOSIS — J329 Chronic sinusitis, unspecified: Secondary | ICD-10-CM

## 2024-03-03 NOTE — Progress Notes (Signed)
 "  New Patient Note  RE: Michael Bryant MRN: [id number] DOB: 06-Jul-1933 Date of Office Visit: 03/03/2024  Consult requested by: Palmer Purchase, PA-C Primary care provider: Corlis Pagan, NP  Chief Complaint: Establish Care (Sinus issues since 10/2023 allergy releaf every 6hr and saline rinse. 3 course of antibiotics and 1 of prednisone)  History of Present Illness: I had the pleasure of seeing Michael Bryant for initial evaluation at the [facility] on 03/03/2024. He is a 89 y.o. male, who is referred here by Palmer Purchase, PA-C for the evaluation of allergic rhinitis.  He is accompanied today by his wife, daughter and daughter in law who provided/contributed to the history.   Discussed the use of AI scribe software for clinical note transcription with the patient, who gave verbal consent to proceed.    He has a long-standing history of chronic sinus issues, including runny nose, light congestion, sinus pressure, and postnasal drip. These symptoms have been present for many years, with significant worsening noted since late September to early October of this year. Historically, his symptoms were more prominent in the spring, particularly in February and March, but this year they have been severe in the fall.  He experiences occasional sneezing and sometimes has itchy, watery eyes. No frequent epistaxis. He is unsure if his current symptoms represent a sinus infection.   Previous treatments have included steroids and antibiotics, such as a Z-Pak, with no significant improvement. He has also used nasal sprays like Nasonex , Nasacort, and azelastine , which provide some relief when used consistently. He performs sinus rinses regularly.  There is no history of asthma, food or medication allergies, or significant reactions to insect stings. He has not undergone any sinus surgeries. He has not experienced any trauma to the sinuses.  He has a history of a triple bypass surgery in 2003 and  takes medications for heart health, blood pressure, reflux, and prostate issues. He has lost some weight recently due to decreased appetite but is still eating adequately.  He is a non-smoker and has no pets at home. He has experienced chills and cold sweats recently.   12/25/2023 ENT visit: Chronic sinus disease-   89 year old gentleman seen in our office for follow-up evaluation for chronic sinus disease.  Overall he appears to be at his baseline with some ongoing postnasal drainage.  I had a very extensive discussion with family on treatment options.  I again discussed the 2 options would be to continue conservative management and maximize medical therapy with daily saline irrigation, continued daily oral antihistamine, Flonase, and azelastine .  I discussed that if medical management was not successful the other option would be sinus surgery which they again reiterate he would not want to proceed with.  I also discussed it is reasonable to follow-up with allergy and asthma for evaluation of his allergies, previously they reported they would not want to do this, today they agree that this would be reasonable.  I also discussed following up with their dentist to review if there is any odontogenic source of the chronic sinus disease, he notes he has seen a dentist and was told that there was no issue there.  At this point I would continue to recommend medical management, am happy to see them back in the office at any point in the future, they verbalized understanding and agreement to today's plan had no further questions or concerns.  CT sinus 02/27/2024: IMPRESSION: 1. Moderate chronic sinus inflammatory disease as described without significant  change. 2. Mild periodontal disease involving the posterior left upper and lower molar teeth.  Assessment and Plan: Michael Bryant is a 89 y.o. male with:    Chronic rhinosinusitis Chronic sinus issues with significant flare-up since August. Previous  treatments ineffective (steroids and antibiotics). CT scan showed persistent inflammatory changes. Environmental allergies considered less likely to be the cause of his current flare as he normally flares in the spring and not in the fall.  - Ordered blood work for environmental allergies.  Use Nasacort (triamcinolone) OR Nasonex  nasal spray 1-2 sprays per nostril once a day as needed for nasal congestion.  - Use azelastine  nasal spray 1-2 sprays per nostril twice a day as needed for runny nose/drainage. - Nasal saline spray (i.e., Simply Saline) or nasal saline lavage (i.e., NeilMed) is recommended as needed and prior to medicated nasal sprays. - Discuss with ENT regarding potential surgical intervention and candidacy. - Avoid essential oils and infusions for sinus relief.   Return if symptoms worsen or fail to improve.  No orders of the defined types were placed in this encounter.  Lab Orders         Allergens w/Total IgE- Respiratory-Area 2      Other allergy screening: Asthma: no Food allergy: no Medication allergy: yes Hymenoptera allergy: no Urticaria: no Eczema:no History of recurrent infections suggestive of immunodeficency: no  Diagnostics: None.    Past Medical History: Patient Active Problem List   Diagnosis Date Noted   Hypercholesterolemia 03/30/2023   Chest pain of uncertain etiology 09/25/2020   Syncope 07/02/2013   History of ventricular fibrillation 07/02/2013   NSVT (nonsustained ventricular tachycardia) (HCC) 06/16/2013   Myocardial infarction, old 06/16/2013   Essential hypertension 03/25/2013   CAD (coronary artery disease), hx CABG 2003 03/24/2013   Arm pain 03/24/2013   Past Medical History:  Diagnosis Date   CAD (coronary artery disease)    VF arrest >>  s/p CABG (2003) // Myoview  1/15: no ischemia // Cath 4/15: all grafts patent   HTN (hypertension)    Hx of VF arrest in 2003 2003   Hyperlipidemia    NSVT (nonsustained ventricular tachycardia)  (HCC) 06/16/2013   Rx with amiodarone  // Echo 6/17: EF 55-60, Gr 1 DD, apical HK   Past Surgical History: Past Surgical History:  Procedure Laterality Date   CARDIAC CATHETERIZATION  12/15/2001   60% proximal LAD narrowing with active mobile thrombosis w/50% sequential areas,50-60% pro   CORONARY ARTERY BYPASS GRAFT  2004   LIMA to LAD, SVG to OM, SVG to RCA   LEFT HEART CATHETERIZATION WITH CORONARY/GRAFT ANGIOGRAM N/A 07/02/2013   Procedure: LEFT HEART CATHETERIZATION WITH EL BILE;  Surgeon: Jerel Balding, MD;  Location: [facility] CATH LAB;  Service: Cardiovascular;  Laterality: N/A;   NM MYOCAR PERF WALL MOTION  12/09/2009   mod. perfusion defect basal inferior and mid inferior regions c/w  infarct/scar, EF 54%   TONSILLECTOMY AND ADENOIDECTOMY     Medication List:  Current Outpatient Medications  Medication Sig Dispense Refill   amiodarone  (PACERONE ) 200 MG tablet Take 0.5 tablets (100 mg total) by mouth daily. 45 tablet 3   aspirin  EC 81 MG tablet Take 81 mg by mouth daily.     azelastine  (ASTELIN ) 0.1 % nasal spray Place 2 sprays into both nostrils 2 (two) times daily. Use in each nostril as directed 30 mL 12   beta carotene w/minerals (OCUVITE) tablet Take 1 tablet by mouth daily.     chlorpheniramine (CHLOR-TRIMETON) 4 MG tablet Take 4  mg by mouth daily as needed (allergies).     chlorthalidone  (HYGROTON ) 25 MG tablet TAKE 1 TABLET(25 MG) BY MOUTH DAILY 90 tablet 3   Cholecalciferol (VITAMIN D) 50 MCG (2000 UT) CAPS Take 2,000 Units by mouth daily.     CINNAMON PO Take 1 tablet by mouth daily.      DICLOFENAC  PO Take 100 mg by mouth daily.      Diclofenac  Sodium CR 100 MG 24 hr tablet Take 100 mg by mouth daily as needed.     fish oil-omega-3 fatty acids 1000 MG capsule Take 1 g by mouth daily.      GELATIN PO Take 9 g by mouth daily.     Glucosamine 500 MG CAPS daily.     methylPREDNISolone  (MEDROL  DOSEPAK) 4 MG TBPK tablet Take with signs of chronic sinusitis and  take as directed 1 each 1   Misc Natural Products (PROSTATE HEALTH) CAPS Take 1 capsule by mouth daily.     mometasone  (NASONEX ) 50 MCG/ACT nasal spray Place 2 sprays into the nose daily. 1 each 12   nitroGLYCERIN  (NITROSTAT ) 0.4 MG SL tablet Place 1 tablet (0.4 mg total) under the tongue every 5 (five) minutes as needed for chest pain. 75 tablet 3   omeprazole (PRILOSEC) 10 MG capsule Take 10 mg by mouth daily.     Polyethyl Glycol-Propyl Glycol (SYSTANE OP) Place 1 drop into both eyes daily as needed (dry eyes).     simvastatin  (ZOCOR ) 20 MG tablet Take 1 tablet (20 mg total) by mouth daily at 6 PM. 30 tablet 0   tamsulosin  (FLOMAX ) 0.4 MG CAPS capsule Take 0.4 mg by mouth daily after supper.      valsartan  (DIOVAN ) 160 MG tablet TAKE 1 TABLET(160 MG) BY MOUTH DAILY 90 tablet 3   No current facility-administered medications for this visit.   Allergies: Allergies[1] Social History: Social History   Socioeconomic History   Marital status: Married    Spouse name: Not on file   Number of children: 2   Years of education: Not on file   Highest education level: Not on file  Occupational History   Occupation: Retired  Tobacco Use   Smoking status: Former    Types: Cigarettes   Smokeless tobacco: Former   Tobacco comments:    Quit 1993  Vaping Use   Vaping status: Never Used  Substance and Sexual Activity   Alcohol use: No   Drug use: No   Sexual activity: Not on file  Other Topics Concern   Not on file  Social History Narrative   Lives at home with wife.          Social Drivers of Health   Tobacco Use: Medium Risk (03/03/2024)   Patient History    Smoking Tobacco Use: Former    Smokeless Tobacco Use: Former    Passive Exposure: Not on Stage Manager: Not on Ship Broker Insecurity: Not on file  Transportation Needs: Not on file  Physical Activity: Not on file  Stress: Not on file  Social Connections: Not on file  Depression (PHQ2-9): Not on file   Alcohol Screen: Not on file  Housing: Not on file  Utilities: Not on file  Health Literacy: Not on file   Lives in a house. Smoking: denies Occupation: retired  Landscape Architect HistorySurveyor, Minerals in the house: no Engineer, Civil (consulting) in the family room: yes Carpet in the bedroom: yes Heating: gas Cooling: central Pet: no  Family History: Family  History  Problem Relation Age of Onset   Cancer Mother 25   CAD Father 60       Died age 62   Heart disease Father    Problem                               Relation Asthma                                   grandson Eczema                                son Allergic rhino conjunctivitis     daughter   Review of Systems  Constitutional:  Positive for chills. Negative for appetite change, fever and unexpected weight change.  HENT:  Positive for congestion, postnasal drip, rhinorrhea and sinus pressure. Negative for sneezing.   Eyes:  Negative for itching.  Respiratory:  Negative for cough, chest tightness, shortness of breath and wheezing.   Cardiovascular:  Negative for chest pain.  Gastrointestinal:  Negative for abdominal pain.  Genitourinary:  Negative for difficulty urinating.  Skin:  Negative for rash.  Neurological:  Positive for headaches.    Objective: BP 118/70   Pulse 73   Temp 98 F (36.7 C) (Temporal)   Resp 18   Ht 5' 10 (1.778 m)   Wt 180 lb (81.6 kg)   SpO2 (!) 18%   BMI 25.83 kg/m  Body mass index is 25.83 kg/m. Physical Exam Vitals and nursing note reviewed.  Constitutional:      Appearance: Normal appearance. He is well-developed.  HENT:     Head: Normocephalic and atraumatic.     Right Ear: Tympanic membrane and external ear normal.     Left Ear: Tympanic membrane and external ear normal.     Nose: Nose normal.     Mouth/Throat:     Mouth: Mucous membranes are moist.     Pharynx: Oropharynx is clear.  Eyes:     Conjunctiva/sclera: Conjunctivae normal.  Cardiovascular:     Rate and Rhythm:  Normal rate and regular rhythm.     Heart sounds: Normal heart sounds. No murmur heard.    No friction rub. No gallop.  Pulmonary:     Effort: Pulmonary effort is normal.     Breath sounds: Normal breath sounds. No wheezing, rhonchi or rales.  Musculoskeletal:     Cervical back: Neck supple.  Skin:    General: Skin is warm.     Findings: No rash.  Neurological:     Mental Status: He is alert and oriented to person, place, and time.  Psychiatric:        Behavior: Behavior normal.    The plan was reviewed with the patient/family, and all questions/concerned were addressed.  It was my pleasure to see Marquon today and participate in his care. Please feel free to contact me with any questions or concerns.  Sincerely,  Orlan Cramp, DO Allergy & Immunology  [facility]  [facility] office: [phone] [facility] office: [phone]    [1]  Allergies Allergen Reactions   Codeine Other (See Comments) and Hives    wont go to sleep   "

## 2024-03-03 NOTE — Patient Instructions (Addendum)
 Sinus  Get bloodwork We are ordering labs, so please allow 1-2 weeks for the results to come back. With the newly implemented Cures Act, the labs might be visible to you at the same time that they become visible to me. However, I will not address the results until all of the results are back, so please be patient.  In the meantime, continue recommendations in your patient instructions, including avoidance measures (if applicable), until you hear from me. Use Nasacort (triamcinolone) OR Nasonex  nasal spray 1-2 sprays per nostril once a day as needed for nasal congestion.  Use azelastine  nasal spray 1-2 sprays per nostril twice a day as needed for runny nose/drainage. Nasal saline spray (i.e., Simply Saline) or nasal saline lavage (i.e., NeilMed) is recommended as needed and prior to medicated nasal sprays. Avoid essential oils and infusions for sinus relief.   Follow up as needed. Follow up with ENT.     Buffered Isotonic Saline Irrigations:  Goal: When you irrigate with the isotonic saline (salt water) it washes mucous and other debris from your nose that could be contributing to your nasal symptoms.   Recipe: Obtain 1 quart jar that is clean Fill with clean (bottled, boiled or distilled) water Add 1-2 heaping teaspoons of salt without iodine If the solution with 2 teaspoons of salt is too strong, adjust the amount down until better tolerated Add 1 teaspoon of [employer] baking soda (pure bicarbonate) Mix ingredients together and store at room temperature and discard after 1 week * Alternatively you can buy pre made salt packets for the NeilMed bottle or there          are other over the counter brands available  Instructions: Warm  cup of the solution in the microwave if desired but be careful not to overheat as this will burn the inside of your nose Stand over a sink (or do it while you shower) and squirt the solution into one side of your nose aiming towards the back of your  head Sometimes saying coca cola while irrigating can be helpful to prevent fluid from going down your throat  The solution will travel to the back of your nose and then come out the other side Perform this again on the other side Try to do this twice a day If you are using a nasal spray in addition to the irrigation, irrigate first and then use the topical nasal spray otherwise you will wash the nasal spray out of your nose

## 2024-03-10 ENCOUNTER — Other Ambulatory Visit: Payer: Self-pay

## 2024-03-10 DIAGNOSIS — R748 Abnormal levels of other serum enzymes: Secondary | ICD-10-CM

## 2024-03-17 NOTE — Progress Notes (Unsigned)
 "   [facility] Telephone:([phone]) [phone]   Fax:([phone]  CONSULT NOTE  REFERRING PHYSICIAN: Izetta Cork NP  REASON FOR CONSULTATION:  New colon mass, anemia, and thrombocytopenia  HPI Michael Bryant is a 89 y.o. male past medical history significant for SVT, CAD, hypertension, prior myocardial infarction, and hyperlipidemia, referred for evaluation of new colon mass, anemia, and thrombocytosis.  The patient had a ER follow-up with his PCP on 03/02/2024.   The patient presented to the emergency room due to weakness and lethargy.  At that time he was found to have anemia, thrombocytosis, and mild leukocytosis with unclear etiology.  He also has decreased appetite and weight loss of 8 pounds.  He denies any overt bleeding.  His PCP ordered stool cards which were ***for blood.  The patient's ferritin was normal at 233.  His sed rate was elevated at 60.  His iron  panel showed  low iron  of 17, saturation low at 9%, and low TIBC at 196.  His vitamin B12 was high at 1396.  His folate was normal at 11.7.  His white blood cell count was elevated at 13.2, hemoglobin low at 9.9.  His MCV was normal at 91.  Platelet count was elevated at 658.  His creatinine is elevated at 1.47.  There was no clear findings for infection except for sinus infection but he was treated for this and repeat CT was perforemd which was stable and he symptomatically improved. UA negative. No pneumonia was seen on CXR.   He was referred to gastroenterology given his anemia.  He was started on iron  supplements twice per week due to his issues with constipation. He is taking 35 mg of iron  twice a week. He reported black stool.   Per chart review, it appears the anemia and thrombocytopenia are new. His Hbg was slightly low starting in October 2025 at 12.2. Otherwise his Hbg and platelets were normal in April 2025.   The patient saw Dr. Kriss from gastroenterology on 03/10/24. For the constipation, this has  been occurring for ***. Abdominal pain?? Laxatives ***. Prune juice. She recommended that he increase his iron  supplement to 3x per week at least with OTC miralax. He also had newly elevated LFTs.  A colonoscopy and EGD was performed on 03/18/24.   The EGD showed small hiatal hernia, patchy mild inflammation in the gastric body.   The colonoscopy revealed non-thrombosed external hemorrhoid and internal hemorrhoids and likely malignant tumor in the proximal ascending colon. There was also inflammed, ulcerated and erythematous mucosa in the transverse colon and hepatic flexture.   He is scheduled for CT CAP next week on 03/23/24.  He was also urgently referred to general surgery. He has an appointment on ***  The pathology of his colonoscopy and EGD is still pending.   The patient has never required a blood transfusion or iron  infusion.    He does *** have a lot of iron  in her diet secondary to preference***.  Rectal bleeding ***. ***Chronic constipation ***? How long *** Pencil thin stools***.   ***Last colonoscopy was on ***by ***Abdominal pain? Fevers? Weight loss *** Appetite change ***. ***Jaundice ***.   ***When is the appointment with GI.   He takes aspirin  but no other blood thinners.***  Otherwise he denies any epistaxis, gingival bleeding, hemoptysis, or hematemesis.  ***Craves ice chips. He does not drink a lot of black tea or green tea.  He denies any history of bariatric surgery.  ***Creatinine.  Denies any family history of any other malignancy or colon cancer.     Regarding her symptoms he reports her energy is ***.  He has *** lightheadedness.  Regarding the thrombocytosis, the patient reports ***. ***joint pain and inflammation? He denies any fever, chills, night sweats, or lymphadenopathy.      He denies any nausea, vomiting, diarrhea, constipation, abdominal pain, or abdominal fullness.   He denies recent steroids or hormone use. He denies history of DVT, MI, or PE.  Denies splenectomy. Denies IBS/IBD.  Denies any unusual lightheadedness, syncope, or visual changes.  Denies any flushing.   Denies any autoimmune or inflammatory disorders. He denies any other signs of infection such as nasal congestion, skin infection, sore throat, recent dental infections, cough, shortness of breath, dysuria, or diarrhea.   __________  The patient was initially evaluated in the emergency department for weakness and lethargy, where he was found to have anemia, thrombocytosis, and mild leukocytosis of unclear etiology. He also reported decreased appetite and unintentional weight loss of approximately 8 pounds. He denied any overt bleeding at that time.  He followed up with his PCP on 03/02/24, where further workup was initiated. Stool cards were positive/negative* for occult blood.  Laboratory evaluation revealed:  Iron  deficiency pattern with low serum iron  (17), low saturation (9%), and low TIBC (196), despite normal ferritin (233)  Elevated ESR at 60  Vitamin B12 elevated at 1396  Normal folate at 11.7  CBC showed:  WBC 13.2  Hemoglobin 9.9  MCV 91  Platelets 658  Creatinine was elevated at 1.47.  There were no clear infectious findings at that time. He was treated for a sinus infection, with symptomatic improvement. Repeat CT imaging was stable. UA was negative, CXR showed no pneumonia.  He was referred to Gastroenterology for evaluation of anemia and weight loss. He was started on oral iron  supplementation (35 mg twice weekly) due to constipation. He reports black stools, which may be related to iron  supplementation.  Chart review shows that both the anemia and thrombocytosis are new. Hemoglobin was mildly low in October 2025 (12.2), with previously normal hemoglobin and platelet counts in April 2025.  The patient was seen by Dr. Kriss (GI) on 03/10/24.  For constipation, symptoms have been present for ***. He reports abdominal pain ***. He has tried  *** including prune juice and laxatives ***. GI recommended increasing iron  to at least three times weekly with OTC Miralax. Newly elevated LFTs were also noted.  EGD and Colonoscopy performed on 03/18/24  EGD Findings:  Small hiatal hernia  Patchy mild inflammation in the gastric body  Colonoscopy Findings:  Non-thrombosed external hemorrhoid  Internal hemorrhoids  Likely malignant tumor in the proximal ascending colon  Inflamed, ulcerated, erythematous mucosa in the transverse colon and hepatic flexure  Pathology: Pending  CT Chest/Abdomen/Pelvis scheduled for 03/23/24  Urgent referral placed to General Surgery  Appointment scheduled for */pending  No prior blood transfusions  No prior IV iron  infusions  Diet: low/high iron  intake due to dietary preferences  Currently taking aspirin , no other anticoagulants or antiplatelet agents  Fatigue/energy level: ***  Lightheadedness: ***  Dyspnea on exertion: ***  Pica (ice craving): ***  Rectal bleeding: ***  Melena outside of iron  use: ***  GI Symptoms:  Chronic constipation: *** duration ***  Pencil-thin stools: ***  Abdominal pain: ***  Nausea/vomiting: ***  Appetite changes: ***  Fevers/chills: ***  Jaundice: ***  Thrombocytosis / Inflammatory Symptoms:  Joint pain or inflammation: ***  Fevers, night  sweats, weight loss beyond noted: ***  Lymphadenopathy: ***  Prior colonoscopy: date by provider (around age 34)  History of IBD/IBS: Denies  Bariatric surgery: Denies  Epistaxis, gingival bleeding, hemoptysis, hematemesis: Denies  History of DVT/PE: Denies  Splenectomy: Denies  No flushing  No syncope or visual changes  No recent steroid or hormone use  No autoimmune or inflammatory disorders  No signs of active infection (respiratory, urinary, skin, dental) HPI  Past Medical History:  Diagnosis Date   CAD (coronary artery disease)    VF arrest >>  s/p CABG (2003) // Myoview   1/15: no ischemia // Cath 4/15: all grafts patent   HTN (hypertension)    Hx of VF arrest in 2003 2003   Hyperlipidemia    NSVT (nonsustained ventricular tachycardia) (HCC) 06/16/2013   Rx with amiodarone  // Echo 6/17: EF 55-60, Gr 1 DD, apical HK    Past Surgical History:  Procedure Laterality Date   CARDIAC CATHETERIZATION  12/15/2001   60% proximal LAD narrowing with active mobile thrombosis w/50% sequential areas,50-60% pro   CORONARY ARTERY BYPASS GRAFT  2004   LIMA to LAD, SVG to OM, SVG to RCA   LEFT HEART CATHETERIZATION WITH CORONARY/GRAFT ANGIOGRAM N/A 07/02/2013   Procedure: LEFT HEART CATHETERIZATION WITH EL BILE;  Surgeon: Jerel Balding, MD;  Location: [facility] CATH [facility];  Service: Cardiovascular;  Laterality: N/A;   NM MYOCAR PERF WALL MOTION  12/09/2009   mod. perfusion defect basal inferior and mid inferior regions c/w  infarct/scar, EF 54%   TONSILLECTOMY AND ADENOIDECTOMY      Family History  Problem Relation Age of Onset   Cancer Mother 104   CAD Father 72       Died age 56   Heart disease Father     Social History Social History[1]  Allergies[2]  Current Outpatient Medications  Medication Sig Dispense Refill   amiodarone  (PACERONE ) 200 MG tablet Take 0.5 tablets (100 mg total) by mouth daily. 45 tablet 3   aspirin  EC 81 MG tablet Take 81 mg by mouth daily.     azelastine  (ASTELIN ) 0.1 % nasal spray Place 2 sprays into both nostrils 2 (two) times daily. Use in each nostril as directed 30 mL 12   beta carotene w/minerals (OCUVITE) tablet Take 1 tablet by mouth daily.     chlorpheniramine (CHLOR-TRIMETON) 4 MG tablet Take 4 mg by mouth daily as needed (allergies).     chlorthalidone  (HYGROTON ) 25 MG tablet TAKE 1 TABLET(25 MG) BY MOUTH DAILY 90 tablet 3   Cholecalciferol (VITAMIN D ) 50 MCG (2000 UT) CAPS Take 2,000 Units by mouth daily.     CINNAMON PO Take 1 tablet by mouth daily.      DICLOFENAC  PO Take 100 mg by mouth daily.      Diclofenac   Sodium CR 100 MG 24 hr tablet Take 100 mg by mouth daily as needed.     fish oil-omega-3 fatty acids 1000 MG capsule Take 1 g by mouth daily.      GELATIN PO Take 9 g by mouth daily.     Glucosamine 500 MG CAPS daily.     methylPREDNISolone  (MEDROL  DOSEPAK) 4 MG TBPK tablet Take with signs of chronic sinusitis and take as directed 1 each 1   Misc Natural Products (PROSTATE HEALTH) CAPS Take 1 capsule by mouth daily.     mometasone  (NASONEX ) 50 MCG/ACT nasal spray Place 2 sprays into the nose daily. 1 each 12   nitroGLYCERIN  (NITROSTAT ) 0.4 MG SL tablet  Place 1 tablet (0.4 mg total) under the tongue every 5 (five) minutes as needed for chest pain. 75 tablet 3   omeprazole (PRILOSEC) 10 MG capsule Take 10 mg by mouth daily.     Polyethyl Glycol-Propyl Glycol (SYSTANE OP) Place 1 drop into both eyes daily as needed (dry eyes).     simvastatin  (ZOCOR ) 20 MG tablet Take 1 tablet (20 mg total) by mouth daily at 6 PM. 30 tablet 0   tamsulosin  (FLOMAX ) 0.4 MG CAPS capsule Take 0.4 mg by mouth daily after supper.      valsartan  (DIOVAN ) 160 MG tablet TAKE 1 TABLET(160 MG) BY MOUTH DAILY 90 tablet 3   No current facility-administered medications for this visit.    REVIEW OF SYSTEMS:   Review of Systems  Constitutional: Negative for appetite change, chills, fatigue, fever and unexpected weight change.  HENT:   Negative for mouth sores, nosebleeds, sore throat and trouble swallowing.   Eyes: Negative for eye problems and icterus.  Respiratory: Negative for cough, hemoptysis, shortness of breath and wheezing.   Cardiovascular: Negative for chest pain and leg swelling.  Gastrointestinal: Negative for abdominal pain, constipation, diarrhea, nausea and vomiting.  Genitourinary: Negative for bladder incontinence, difficulty urinating, dysuria, frequency and hematuria.   Musculoskeletal: Negative for back pain, gait problem, neck pain and neck stiffness.  Skin: Negative for itching and rash.  Neurological:  Negative for dizziness, extremity weakness, gait problem, headaches, light-headedness and seizures.  Hematological: Negative for adenopathy. Does not bruise/bleed easily.  Psychiatric/Behavioral: Negative for confusion, depression and sleep disturbance. The patient is not nervous/anxious.     PHYSICAL EXAMINATION:  There were no vitals taken for this visit.  ECOG PERFORMANCE STATUS: {CHL ONC ECOG [id number]  Physical Exam  Constitutional: Oriented to person, place, and time and well-developed, well-nourished, and in no distress. No distress.  HENT:  Head: Normocephalic and atraumatic.  Mouth/Throat: Oropharynx is clear and moist. No oropharyngeal exudate.  Eyes: Conjunctivae are normal. Right eye exhibits no discharge. Left eye exhibits no discharge. No scleral icterus.  Neck: Normal range of motion. Neck supple.  Cardiovascular: Normal rate, regular rhythm, normal heart sounds and intact distal pulses.   Pulmonary/Chest: Effort normal and breath sounds normal. No respiratory distress. No wheezes. No rales.  Abdominal: Soft. Bowel sounds are normal. Exhibits no distension and no mass. There is no tenderness.  Musculoskeletal: Normal range of motion. Exhibits no edema.  Lymphadenopathy:    No cervical adenopathy.  Neurological: Alert and oriented to person, place, and time. Exhibits normal muscle tone. Gait normal. Coordination normal.  Skin: Skin is warm and dry. No rash noted. Not diaphoretic. No erythema. No pallor.  Psychiatric: Mood, memory and judgment normal.  Vitals reviewed.  LABORATORY DATA: Lab Results  Component Value Date   WBC 12.1 (H) 02/27/2024   HGB 9.9 (L) 02/27/2024   HCT 29.2 (L) 02/27/2024   MCV 88.8 02/27/2024   PLT 639 (H) 02/27/2024      Chemistry      Component Value Date/Time   NA 135 02/27/2024 0831   NA 139 12/25/2022 1506   K 4.5 02/27/2024 0831   CL 96 (L) 02/27/2024 0831   CO2 25 02/27/2024 0831   BUN 28 (H) 02/27/2024 0831   BUN 26  12/25/2022 1506   CREATININE 1.30 (H) 02/27/2024 0831   CREATININE 0.96 06/30/2013 1653      Component Value Date/Time   CALCIUM 9.2 02/27/2024 0831   ALKPHOS 134 (H) 02/27/2024 0831   AST 36  02/27/2024 0831   ALT 60 (H) 02/27/2024 0831   BILITOT 0.6 02/27/2024 0831   BILITOT 0.7 01/29/2022 0948       RADIOGRAPHIC STUDIES: CT Maxillofacial WO CM Result Date: 02/27/2024 CLINICAL DATA:  Persistent headache after antibiotic treatment. Evaluate for sinus infection. EXAM: CT MAXILLOFACIAL WITHOUT CONTRAST TECHNIQUE: Multidetector CT imaging of the maxillofacial structures was performed. Multiplanar CT image reconstructions were also generated. RADIATION DOSE REDUCTION: This exam was performed according to the departmental dose-optimization program which includes automated exposure control, adjustment of the mA and/or kV according to patient size and/or use of iterative reconstruction technique. COMPARISON:  11/24/2023 FINDINGS: Osseous: No fracture or mandibular dislocation. No destructive process. Mild periodontal disease involving the posterior left upper and lower molar teeth. Orbits: Negative. No traumatic or inflammatory finding. Sinuses: Paranasal sinuses are well developed. There is complete opacification of the frontal maxillary sinuses with moderate opacification over the ethmoid air cells and opacification of the ostiomeatal complexes bilaterally. These findings are unchanged. Sphenoid sinuses clear. Mastoid air cells are clear. Minimal deviation of the nasal septum to the right. Mild associated mucoperiosteal thickening. Soft tissues: Negative. Limited intracranial: No significant or unexpected finding. IMPRESSION: 1. Moderate chronic sinus inflammatory disease as described without significant change. 2. Mild periodontal disease involving the posterior left upper and lower molar teeth. Electronically Signed   By: Toribio Agreste M.D.   On: 02/27/2024 10:14   DG Chest 2 View Result Date:  02/27/2024 CLINICAL DATA:  Chest congestion for 2 months.  Chest pain. EXAM: CHEST - 2 VIEW COMPARISON:  February 01, 2019. FINDINGS: The heart size and mediastinal contours are within normal limits. Status post coronary artery bypass graft. Both lungs are clear. The visualized skeletal structures are unremarkable. IMPRESSION: No active cardiopulmonary disease. Electronically Signed   By: Lynwood Landy Raddle M.D.   On: 02/27/2024 10:09    ASSESSMENT: This is a very pleasant 89 year old male referred to the clinic for anemia and thrombocytosis but was recently found to have colon mass and suspected colon malignancy  Colon Mass -Underwent EGD and colonoscopy under the care of Dr. Kriss on 03/18/24. The final pathology is pending.  - He is scheduled for CT AP to complete the staging work up on 03/23/24 - He is scheduled to see surgery on *** - The patient was seen with Dr. Conchetta who reviewed his imaging, symptoms, and blood work.  - Dr. Conchetta recommends *** - F/U - CEA - Tumor Board - Port - EMLA - Appetite   Anemia and thrombocytosis - Patient was seen with Dr. Conchetta today - Patient has new onset worsening anemia and new onset thrombocytosis which could be secondary to the anemia - Patient is expected to follow with gastroenterology on *** -His PCP has ordered stool cards. - He has significantly elevated CRP and ESR.  Likely related to ***inflammation - Arrange for CBC, CMP, ESR, CRP, iron  studies, ferritin, MMA, reticulocyte panel,  -MNP? Smear ***  The patient voices understanding of current disease status and treatment options and is in agreement with the current care plan.  All questions were answered. The patient knows to call the clinic with any problems, questions or concerns. We can certainly see the patient much sooner if necessary.  Thank you so much for allowing me to participate in the care of BECKHEM ISADORE. I will continue to follow up the patient with you and assist in his  care.  I spent {CHL ONC TIME VISIT - DTPQU:[id number]} counseling the patient  face to face. The total time spent in the appointment was {CHL ONC TIME VISIT - DTPQU:[id number]}.  Disclaimer: This note was dictated with voice recognition software. Similar sounding words can inadvertently be transcribed and may not be corrected upon review.   Deanglo Hissong L Keshaun Dubey March 17, 2024, 4:35 PM       [1]  Social History Tobacco Use   Smoking status: Former    Types: Cigarettes   Smokeless tobacco: Former   Tobacco comments:    Quit 1993  Vaping Use   Vaping status: Never Used  Substance Use Topics   Alcohol use: No   Drug use: No  [2]  Allergies Allergen Reactions   Codeine Other (See Comments) and Hives    wont go to sleep   "

## 2024-03-19 ENCOUNTER — Other Ambulatory Visit: Payer: Self-pay | Admitting: Internal Medicine

## 2024-03-19 DIAGNOSIS — K6389 Other specified diseases of intestine: Secondary | ICD-10-CM

## 2024-03-20 ENCOUNTER — Telehealth: Payer: Self-pay | Admitting: Physician Assistant

## 2024-03-20 ENCOUNTER — Inpatient Hospital Stay: Admitting: Physician Assistant

## 2024-03-20 ENCOUNTER — Telehealth: Payer: Self-pay

## 2024-03-20 ENCOUNTER — Inpatient Hospital Stay

## 2024-03-20 NOTE — Progress Notes (Signed)
 PATIENT NAVIGATOR PROGRESS NOTE  Name: Michael Bryant Date: 03/20/2024 MRN: [id number]  DOB: May 31, 1933  Patient was scheduled to be seen by our office this morning as a new Hematology referral for iron  deficiency and high platelet count. Per Cassie Heilingoetter, PA's note, the patient not evaluated and was advised to go to the ED after arriving to the office with a documented temperature of 101 F.  The patient underwent a colonoscopy on 03/18/2024, during which a colonic mass was identified; pathology results are pending. Patient's appointment has been rescheduled as an oncology consultation on 03/24/2024 with Dr. Lanny.  Called and spoke to patient's daughter, [name], to inform her of the rescheduled appointment. During the conversation, [name] requested that communication from out office be limited to herself or the patient only, and not the patient's daughter-in-law, Edsel, who is currently listed as an emergency contact. [name] was advised that patient will need to complete an update Release of Information form at his visit on 03/24/24, as the most recent ROI form on file is from 2017. [name] verbalized understanding and agreement.  [name] inquired why the patient was unable to receive an iron  infusion at the initial visit.  She was informed that, as this is an outpatient facility, insurance authorization is required prior to administering iron  infusions. Orders for iron  infusion cannot be entered nor can the authorization be started until after the patient has been evaluated in person. [name] expressed concern regarding the patient's declining energy level and increased weakness. She reported that the patient's hemoglobin was last checked 10 days ago and was 8 g/dL at that time. She was encouraged to have patient evaluated by his PCP or to have him go to the ED for repeat blood work and possible intervention. She was advised that if the patient is anemic and symptomatic (weakness, SOB), these symptoms could be  addressed in the ED setting.  [name] stated that they were hesitant to return to the ED, expressing concern that the patient has undergone multiple evaluations without definitive treatment. She was again encouraged to seek emergency care if the patient's condition continues to decline or his fever recurs. [name] reported that patient was afebrile upon returning home. [name] was provided with my direct contact information and was instructed to call office with any further questions or concerns. Plan to follow-up with patient and his family at the time of initial consult on 03/24/24.   Time spent counseling/coordinating care: 45-60 minutes

## 2024-03-20 NOTE — Telephone Encounter (Signed)
 Patient was scheduled for initial consultation for iron  deficiency anemia and thrombocytosis. Per chart review, patient underwent colonoscopy earlier this week with new finding of a colon mass; pathology is pending. Patient presented to clinic today with reported fever to 101F. Given current flu/COVID season and institutional policy at the cancer center regarding febrile or potentially contagious patients, fever reported to Dr. Conchetta, who recommended rescheduling today's visit. Emergency department evaluation was offered due to fever and recent findings. They reported to team member they would contact PCP.  Patient/family were advised to seek emergency evaluation if symptoms worsen. GI navigator was notified to assist with rescheduling. Recommendation is for patient to be rescheduled and established with the GI oncology team to address iron  deficiency anemia, thrombocytosis, and suspected colon malignancy once pathology results are available. GI navigation was able to reschedule with Dr. Lanny on 03/24/24.

## 2024-03-20 NOTE — Telephone Encounter (Signed)
 Patient arrived early for his appointment to see Cassie (PA).  Vitals were done and patient had a fever of 101 tympanic.  I reported the fever to Cassie (PA) and she asked that I report that information to Dr Autumn who was going to be teaming with her to see this patient.  Informed Dr Autumn of the temperature and he asked that I have Cassie (PA) to have patient rescheduled due to fever and the high rate of Flu and viruses going around.  I relayed that to Cassie (PA) and was asked to relay that information to the patient.  I spoke with patient, his wife, and daughter who were very concerned that they were told they would be getting an infusion after seeing the Dr today.  I explained to them this visit was a new patient consultation and no infusion would have been done today but also more so because the patient had a fever and we can't do infusions with patient running a fever.  They were very concerned that the patient was not eating and drinking as much as he should.  I advised them to have him eat/drink something every 2 hours and make sure he stays well hydrated.  They stated they would contact their regular PCP and see about getting a FLU and a covid test done.  Wife and daughter both understood the importance of not getting the iron  infusion today.  I explained to them the rescheduling process and told them that someone would be in touch with them to reschedule the appointment.  I apologized for the mishap and the confusion.  Patient, wife, and daughter understood.

## 2024-03-21 ENCOUNTER — Emergency Department (HOSPITAL_COMMUNITY)

## 2024-03-21 ENCOUNTER — Other Ambulatory Visit: Payer: Self-pay

## 2024-03-21 ENCOUNTER — Observation Stay (HOSPITAL_COMMUNITY)
Admission: EM | Admit: 2024-03-21 | Discharge: 2024-03-24 | Disposition: A | Discharge status: Home / Self Care | Attending: Student | Admitting: Student

## 2024-03-21 DIAGNOSIS — E871 Hypo-osmolality and hyponatremia: Secondary | ICD-10-CM

## 2024-03-21 DIAGNOSIS — R1909 Other intra-abdominal and pelvic swelling, mass and lump: Secondary | ICD-10-CM | POA: Insufficient documentation

## 2024-03-21 DIAGNOSIS — Z7982 Long term (current) use of aspirin: Secondary | ICD-10-CM | POA: Diagnosis not present

## 2024-03-21 DIAGNOSIS — N179 Acute kidney failure, unspecified: Secondary | ICD-10-CM | POA: Insufficient documentation

## 2024-03-21 DIAGNOSIS — N1831 Chronic kidney disease, stage 3a: Secondary | ICD-10-CM

## 2024-03-21 DIAGNOSIS — R531 Weakness: Secondary | ICD-10-CM | POA: Diagnosis present

## 2024-03-21 DIAGNOSIS — J329 Chronic sinusitis, unspecified: Secondary | ICD-10-CM

## 2024-03-21 DIAGNOSIS — I472 Ventricular tachycardia, unspecified: Secondary | ICD-10-CM | POA: Insufficient documentation

## 2024-03-21 DIAGNOSIS — J101 Influenza due to other identified influenza virus with other respiratory manifestations: Secondary | ICD-10-CM | POA: Diagnosis not present

## 2024-03-21 DIAGNOSIS — R7989 Other specified abnormal findings of blood chemistry: Secondary | ICD-10-CM

## 2024-03-21 DIAGNOSIS — I4729 Other ventricular tachycardia: Secondary | ICD-10-CM | POA: Diagnosis present

## 2024-03-21 DIAGNOSIS — E440 Moderate protein-calorie malnutrition: Secondary | ICD-10-CM | POA: Insufficient documentation

## 2024-03-21 DIAGNOSIS — I5043 Acute on chronic combined systolic (congestive) and diastolic (congestive) heart failure: Secondary | ICD-10-CM

## 2024-03-21 DIAGNOSIS — Z951 Presence of aortocoronary bypass graft: Secondary | ICD-10-CM | POA: Insufficient documentation

## 2024-03-21 DIAGNOSIS — I5033 Acute on chronic diastolic (congestive) heart failure: Secondary | ICD-10-CM

## 2024-03-21 DIAGNOSIS — I2510 Atherosclerotic heart disease of native coronary artery without angina pectoris: Secondary | ICD-10-CM | POA: Diagnosis present

## 2024-03-21 DIAGNOSIS — D509 Iron deficiency anemia, unspecified: Secondary | ICD-10-CM | POA: Diagnosis not present

## 2024-03-21 DIAGNOSIS — I10 Essential (primary) hypertension: Secondary | ICD-10-CM | POA: Diagnosis present

## 2024-03-21 DIAGNOSIS — Z87891 Personal history of nicotine dependence: Secondary | ICD-10-CM | POA: Insufficient documentation

## 2024-03-21 DIAGNOSIS — I130 Hypertensive heart and chronic kidney disease with heart failure and stage 1 through stage 4 chronic kidney disease, or unspecified chronic kidney disease: Secondary | ICD-10-CM | POA: Diagnosis not present

## 2024-03-21 DIAGNOSIS — D649 Anemia, unspecified: Secondary | ICD-10-CM

## 2024-03-21 DIAGNOSIS — E876 Hypokalemia: Secondary | ICD-10-CM | POA: Diagnosis not present

## 2024-03-21 DIAGNOSIS — R627 Adult failure to thrive: Secondary | ICD-10-CM

## 2024-03-21 DIAGNOSIS — J9601 Acute respiratory failure with hypoxia: Principal | ICD-10-CM | POA: Diagnosis present

## 2024-03-21 DIAGNOSIS — E7800 Pure hypercholesterolemia, unspecified: Secondary | ICD-10-CM | POA: Diagnosis not present

## 2024-03-21 DIAGNOSIS — Z79899 Other long term (current) drug therapy: Secondary | ICD-10-CM | POA: Diagnosis not present

## 2024-03-21 DIAGNOSIS — K6389 Other specified diseases of intestine: Secondary | ICD-10-CM

## 2024-03-21 LAB — LACTIC ACID, PLASMA: Lactic Acid, Venous: 1.8 mmol/L (ref 0.5–1.9)

## 2024-03-21 LAB — BASIC METABOLIC PANEL WITH GFR
Anion gap: 10 (ref 5–15)
BUN: 21 mg/dL (ref 8–23)
CO2: 30 mmol/L (ref 22–32)
Calcium: 9 mg/dL (ref 8.9–10.3)
Chloride: 90 mmol/L — ABNORMAL LOW (ref 98–111)
Creatinine, Ser: 1.28 mg/dL — ABNORMAL HIGH (ref 0.61–1.24)
GFR, Estimated: 54 mL/min — ABNORMAL LOW
Glucose, Bld: 109 mg/dL — ABNORMAL HIGH (ref 70–99)
Potassium: 3.6 mmol/L (ref 3.5–5.1)
Sodium: 129 mmol/L — ABNORMAL LOW (ref 135–145)

## 2024-03-21 LAB — HEPATIC FUNCTION PANEL
ALT: 73 U/L — ABNORMAL HIGH (ref 0–44)
AST: 71 U/L — ABNORMAL HIGH (ref 15–41)
Albumin: 2.5 g/dL — ABNORMAL LOW (ref 3.5–5.0)
Alkaline Phosphatase: 67 U/L (ref 38–126)
Bilirubin, Direct: 0.2 mg/dL (ref 0.0–0.2)
Indirect Bilirubin: 0.1 mg/dL — ABNORMAL LOW (ref 0.3–0.9)
Total Bilirubin: 0.3 mg/dL (ref 0.0–1.2)
Total Protein: 5.7 g/dL — ABNORMAL LOW (ref 6.5–8.1)

## 2024-03-21 LAB — CBC WITH DIFFERENTIAL/PLATELET
Abs Immature Granulocytes: 0.03 K/uL (ref 0.00–0.07)
Basophils Absolute: 0 K/uL (ref 0.0–0.1)
Basophils Relative: 0 %
Eosinophils Absolute: 0 K/uL (ref 0.0–0.5)
Eosinophils Relative: 0 %
HCT: 30.3 % — ABNORMAL LOW (ref 39.0–52.0)
Hemoglobin: 9.8 g/dL — ABNORMAL LOW (ref 13.0–17.0)
Immature Granulocytes: 1 %
Lymphocytes Relative: 22 %
Lymphs Abs: 1.2 K/uL (ref 0.7–4.0)
MCH: 28.7 pg (ref 26.0–34.0)
MCHC: 32.3 g/dL (ref 30.0–36.0)
MCV: 88.9 fL (ref 80.0–100.0)
Monocytes Absolute: 0.7 K/uL (ref 0.1–1.0)
Monocytes Relative: 12 %
Neutro Abs: 3.5 K/uL (ref 1.7–7.7)
Neutrophils Relative %: 65 %
Platelets: 423 K/uL — ABNORMAL HIGH (ref 150–400)
RBC: 3.41 MIL/uL — ABNORMAL LOW (ref 4.22–5.81)
RDW: 14.6 % (ref 11.5–15.5)
WBC: 5.4 K/uL (ref 4.0–10.5)
nRBC: 0 % (ref 0.0–0.2)

## 2024-03-21 LAB — MAGNESIUM: Magnesium: 1.8 mg/dL (ref 1.7–2.4)

## 2024-03-21 LAB — RESP PANEL BY RT-PCR (RSV, FLU A&B, COVID)  RVPGX2
Influenza A by PCR: POSITIVE — AB
Influenza B by PCR: NEGATIVE
Resp Syncytial Virus by PCR: NEGATIVE
SARS Coronavirus 2 by RT PCR: NEGATIVE

## 2024-03-21 LAB — VITAMIN D 25 HYDROXY (VIT D DEFICIENCY, FRACTURES): Vit D, 25-Hydroxy: 29.2 ng/mL — ABNORMAL LOW (ref 30–100)

## 2024-03-21 LAB — PRO BRAIN NATRIURETIC PEPTIDE: Pro Brain Natriuretic Peptide: 1667 pg/mL — ABNORMAL HIGH

## 2024-03-21 LAB — TROPONIN T, HIGH SENSITIVITY
Troponin T High Sensitivity: 38 ng/L — ABNORMAL HIGH (ref 0–19)
Troponin T High Sensitivity: 35 ng/L — ABNORMAL HIGH (ref 0–19)

## 2024-03-21 MED ORDER — ACETAMINOPHEN 325 MG PO TABS
650.0000 mg | ORAL_TABLET | Freq: Four times a day (QID) | ORAL | Status: DC | PRN
  Administered 2024-03-23: 650 mg via ORAL
  Filled 2024-03-21 (×2): qty 2

## 2024-03-21 MED ORDER — ACETAMINOPHEN 500 MG PO TABS: 1000.0000 mg | ORAL_TABLET | ORAL | Status: DC | PRN

## 2024-03-21 MED ORDER — IRBESARTAN 150 MG PO TABS
150.0000 mg | ORAL_TABLET | Freq: Every day | ORAL | Status: DC
  Administered 2024-03-22 – 2024-03-24 (×3): 150 mg via ORAL
  Filled 2024-03-21 (×3): qty 1

## 2024-03-21 MED ORDER — ACETAMINOPHEN 500 MG PO TABS
1000.0000 mg | ORAL_TABLET | ORAL | Status: DC | PRN
  Administered 2024-03-21: 1000 mg via ORAL
  Filled 2024-03-21: qty 2

## 2024-03-21 MED ORDER — FUROSEMIDE 10 MG/ML IJ SOLN
20.0000 mg | Freq: Once | INTRAMUSCULAR | Status: AC
  Administered 2024-03-21: 20 mg via INTRAVENOUS
  Filled 2024-03-21: qty 2

## 2024-03-21 MED ORDER — ENOXAPARIN SODIUM 40 MG/0.4ML IJ SOSY
40.0000 mg | PREFILLED_SYRINGE | INTRAMUSCULAR | Status: DC
  Administered 2024-03-21 – 2024-03-23 (×3): 40 mg via SUBCUTANEOUS
  Filled 2024-03-21 (×3): qty 0.4

## 2024-03-21 MED ORDER — ALBUTEROL SULFATE (2.5 MG/3ML) 0.083% IN NEBU: 2.5000 mg | INHALATION_SOLUTION | RESPIRATORY_TRACT | Status: DC | PRN

## 2024-03-21 MED ORDER — SIMVASTATIN 20 MG PO TABS
20.0000 mg | ORAL_TABLET | Freq: Every day | ORAL | Status: DC
  Administered 2024-03-22 – 2024-03-23 (×2): 20 mg via ORAL
  Filled 2024-03-21 (×2): qty 1

## 2024-03-21 MED ORDER — TAMSULOSIN HCL 0.4 MG PO CAPS
0.4000 mg | ORAL_CAPSULE | Freq: Every day | ORAL | Status: DC
  Administered 2024-03-22 – 2024-03-23 (×2): 0.4 mg via ORAL
  Filled 2024-03-21 (×2): qty 1

## 2024-03-21 MED ORDER — OSELTAMIVIR PHOSPHATE 75 MG PO CAPS
75.0000 mg | ORAL_CAPSULE | Freq: Once | ORAL | Status: AC
  Administered 2024-03-21: 75 mg via ORAL
  Filled 2024-03-21: qty 1

## 2024-03-21 MED ORDER — AMIODARONE HCL 200 MG PO TABS
100.0000 mg | ORAL_TABLET | Freq: Every day | ORAL | Status: DC
  Administered 2024-03-22 – 2024-03-24 (×3): 100 mg via ORAL
  Filled 2024-03-21 (×3): qty 1

## 2024-03-21 MED ORDER — FLUTICASONE PROPIONATE 50 MCG/ACT NA SUSP: 1.0000 | Freq: Every day | NASAL | Status: DC | PRN

## 2024-03-21 MED ORDER — ASPIRIN 81 MG PO TBEC
81.0000 mg | DELAYED_RELEASE_TABLET | Freq: Every day | ORAL | Status: DC
  Administered 2024-03-22 – 2024-03-24 (×3): 81 mg via ORAL
  Filled 2024-03-21 (×3): qty 1

## 2024-03-21 MED ORDER — FLUOXETINE HCL 10 MG PO CAPS
10.0000 mg | ORAL_CAPSULE | Freq: Every day | ORAL | Status: DC
  Administered 2024-03-21 – 2024-03-23 (×3): 10 mg via ORAL
  Filled 2024-03-21 (×3): qty 1

## 2024-03-21 MED ORDER — OSELTAMIVIR PHOSPHATE 30 MG PO CAPS
30.0000 mg | ORAL_CAPSULE | Freq: Two times a day (BID) | ORAL | Status: DC
  Administered 2024-03-22 – 2024-03-24 (×5): 30 mg via ORAL
  Filled 2024-03-21 (×5): qty 1

## 2024-03-21 MED ORDER — AZELASTINE HCL 0.1 % NA SOLN: 2.0000 | Freq: Two times a day (BID) | NASAL | Status: DC | PRN

## 2024-03-21 MED ORDER — PANTOPRAZOLE SODIUM 40 MG PO TBEC
40.0000 mg | DELAYED_RELEASE_TABLET | Freq: Every day | ORAL | Status: DC
  Administered 2024-03-22 – 2024-03-24 (×3): 40 mg via ORAL
  Filled 2024-03-21 (×3): qty 1

## 2024-03-21 MED ORDER — ACETAMINOPHEN 500 MG PO TABS: 1000.0000 mg | ORAL_TABLET | Freq: Four times a day (QID) | ORAL | Status: DC | PRN

## 2024-03-21 MED ORDER — ACETAMINOPHEN 650 MG RE SUPP: 650.0000 mg | Freq: Four times a day (QID) | RECTAL | Status: DC | PRN

## 2024-03-21 MED ORDER — IOHEXOL 350 MG/ML SOLN
75.0000 mL | Freq: Once | INTRAVENOUS | Status: AC | PRN
  Administered 2024-03-21: 75 mL via INTRAVENOUS

## 2024-03-21 MED ORDER — LACTATED RINGERS IV BOLUS
500.0000 mL | Freq: Once | INTRAVENOUS | Status: AC
  Administered 2024-03-21: 500 mL via INTRAVENOUS

## 2024-03-21 NOTE — Assessment & Plan Note (Addendum)
-  colonoscopy 03/18/2024 with colonic mass concerning for malignancy -work up underway  -scheduled for CT CAP next week on 03/23/24  -urgent referral to surgery placed -no results back on pathology that I can find in care everywhere  -oncology consult moved to 03/24/24

## 2024-03-21 NOTE — Assessment & Plan Note (Signed)
-  echo from 04/2023 showed mildly reduced EF of 45-50% with grade 1 DD -cardiac PET with EF of 43% in 07/2023 -clinically appears euvolemic with no pulmonary edema or effusion on chest imaging -BNP >1600 and has worsening dyspnea on exertion, hypoxia and orthopnea that has worsened over last few weeks -likely has some CHF exacerbation in setting of the flu -lasix  naive, give 20mg  IV lasix  now  -strict I/O and daily weights  -repeat echo pending -GDMT per echo

## 2024-03-21 NOTE — Assessment & Plan Note (Addendum)
 89 year old male recently diagnosed with colonic mass concerning for malignancy presented to ED with worsening shortness of breath, weakness and FTT found to be hypoxic to 88% on RA requiring 2L to maintain saturation -admit to tele  -found to be flu A positive which likely contributing. Start tamiflu , CXR clear -likely has component of acute on chronic combined CHF exacerbation see # 3 -CTA negative for PE  -new anemia, but stable with  hgb of 9.8  -continue to treat above and wean as tolerated

## 2024-03-21 NOTE — Assessment & Plan Note (Signed)
Continue zocor 20 mg daily 

## 2024-03-21 NOTE — Assessment & Plan Note (Addendum)
 Continue medical management  Troponin mildly bumped, but not much from baseline Likely demand ischemia in setting of hypoxia He has no chest pain, EKG pending CTA chest negative for PE

## 2024-03-21 NOTE — Assessment & Plan Note (Addendum)
 Baseline creatinine appears to be around 1.2-1.3  Stable

## 2024-03-21 NOTE — ED Provider Notes (Signed)
 " [facility] EMERGENCY DEPARTMENT AT [facility] Provider Note   CSN: [id number] Arrival date & time: 03/21/24  1304     Patient presents with: No chief complaint on file.   Michael Bryant is a 89 y.o. male with past medical history of CAD, hypertension, prior MI, status post left heart cath with CABG from 2015, colon cancer, who presents emergency department for evaluation of weakness.  Patient reports that he had a low-grade fever yesterday, and was unable to receive his iron  transfusion due to the fever.  Today, he reports feeling worse.  He is endorsing congestion and a recent sinus infection approximately 1 month ago.  He was brought by EMS and initially O2 at 90%, but improved to 99% on 2 L of O2.  Patient states he does not normally wear oxygen at home.  He has no history of COPD, asthma or heart failure.  Patient is currently denying any shortness of breath or chest pain.  He does report pressure behind his eyes, but denies any headache.  He denies any abdominal pain, nausea or vomiting.  HPI     Prior to Admission medications  Medication Sig Start Date End Date Taking? Authorizing Provider  amiodarone  (PACERONE ) 200 MG tablet Take 0.5 tablets (100 mg total) by mouth daily. 06/03/23   Croitoru, Mihai, MD  aspirin  EC 81 MG tablet Take 81 mg by mouth daily.    [provider]  azelastine  (ASTELIN ) 0.1 % nasal spray Place 2 sprays into both nostrils 2 (two) times daily. Use in each nostril as directed 12/25/23   Hedges, Reyes, PA-C  beta carotene w/minerals (OCUVITE) tablet Take 1 tablet by mouth daily.    [provider]  chlorpheniramine (CHLOR-TRIMETON) 4 MG tablet Take 4 mg by mouth daily as needed (allergies).    [provider]  chlorthalidone  (HYGROTON ) 25 MG tablet TAKE 1 TABLET(25 MG) BY MOUTH DAILY 04/15/23   Croitoru, Mihai, MD  Cholecalciferol (VITAMIN D ) 50 MCG (2000 UT) CAPS Take 2,000 Units by mouth daily.    [provider]   CINNAMON PO Take 1 tablet by mouth daily.     [provider]  DICLOFENAC  PO Take 100 mg by mouth daily.     [provider]  Diclofenac  Sodium CR 100 MG 24 hr tablet Take 100 mg by mouth daily as needed.    [provider]  fish oil-omega-3 fatty acids 1000 MG capsule Take 1 g by mouth daily.     [provider]  GELATIN PO Take 9 g by mouth daily.    [provider]  Glucosamine 500 MG CAPS daily.    [provider]  methylPREDNISolone  (MEDROL  DOSEPAK) 4 MG TBPK tablet Take with signs of chronic sinusitis and take as directed 12/11/23   Palmer Reyes, PA-C  Misc Natural Products (PROSTATE HEALTH) CAPS Take 1 capsule by mouth daily.    [provider]  mometasone  (NASONEX ) 50 MCG/ACT nasal spray Place 2 sprays into the nose daily. 11/15/23   Hedges, Reyes, PA-C  nitroGLYCERIN  (NITROSTAT ) 0.4 MG SL tablet Place 1 tablet (0.4 mg total) under the tongue every 5 (five) minutes as needed for chest pain. 02/27/21   Croitoru, Mihai, MD  omeprazole (PRILOSEC) 10 MG capsule Take 10 mg by mouth daily.    [provider]  Polyethyl Glycol-Propyl Glycol (SYSTANE OP) Place 1 drop into both eyes daily as needed (dry eyes).    [provider]  simvastatin  (ZOCOR ) 20 MG  tablet Take 1 tablet (20 mg total) by mouth daily at 6 PM. 03/02/24   Croitoru, Mihai, MD  tamsulosin  (FLOMAX ) 0.4 MG CAPS capsule Take 0.4 mg by mouth daily after supper.  03/02/13   [provider]  valsartan  (DIOVAN ) 160 MG tablet TAKE 1 TABLET(160 MG) BY MOUTH DAILY 04/29/23   Croitoru, Mihai, MD    Allergies: Codeine    Review of Systems  HENT:  Positive for congestion.   Neurological:  Positive for weakness.    Updated Vital Signs BP 126/84 (BP Location: Right Arm)   Pulse 66   Temp 97.9 F (36.6 C) (Oral)   Resp 20   SpO2 95%   Physical Exam Vitals and nursing note reviewed.  Constitutional:      Appearance: Normal appearance.   HENT:     Head: Normocephalic and atraumatic.     Mouth/Throat:     Mouth: Mucous membranes are moist.  Eyes:     General: No scleral icterus.       Right eye: No discharge.        Left eye: No discharge.     Conjunctiva/sclera: Conjunctivae normal.  Cardiovascular:     Rate and Rhythm: Normal rate and regular rhythm.     Pulses: Normal pulses.  Pulmonary:     Effort: Pulmonary effort is normal.     Breath sounds: Normal breath sounds.     Comments: Patient hypoxic down to 88% on room air.  O2 saturation improved to 99% on 2 L. Abdominal:     General: There is no distension.     Tenderness: There is no abdominal tenderness.  Musculoskeletal:        General: No deformity.     Cervical back: Normal range of motion.  Skin:    General: Skin is warm and dry.     Capillary Refill: Capillary refill takes less than 2 seconds.  Neurological:     Mental Status: He is alert.     Motor: No weakness.  Psychiatric:        Mood and Affect: Mood normal.     (all labs ordered are listed, but only abnormal results are displayed) Labs Reviewed  RESP PANEL BY RT-PCR (RSV, FLU A&B, COVID)  RVPGX2 - Abnormal; Notable for the following components:      Result Value   Influenza A by PCR POSITIVE (*)    All other components within normal limits  CBC WITH DIFFERENTIAL/PLATELET - Abnormal; Notable for the following components:   RBC 3.41 (*)    Hemoglobin 9.8 (*)    HCT 30.3 (*)    Platelets 423 (*)    All other components within normal limits  BASIC METABOLIC PANEL WITH GFR - Abnormal; Notable for the following components:   Sodium 129 (*)    Chloride 90 (*)    Glucose, Bld 109 (*)    Creatinine, Ser 1.28 (*)    GFR, Estimated 54 (*)    All other components within normal limits  PRO BRAIN NATRIURETIC PEPTIDE - Abnormal; Notable for the following components:   Pro Brain Natriuretic Peptide 1,667.0 (*)    All other components within normal limits  TROPONIN T, HIGH SENSITIVITY - Abnormal;  Notable for the following components:   Troponin T High Sensitivity 38 (*)    All other components within normal limits  CULTURE, BLOOD (ROUTINE X 2)  CULTURE, BLOOD (ROUTINE X 2)  LACTIC ACID, PLASMA  HEPATIC FUNCTION PANEL  MAGNESIUM  VITAMIN D  25  HYDROXY (VIT D DEFICIENCY, FRACTURES)  COMPREHENSIVE METABOLIC PANEL WITH GFR  CBC  TROPONIN T, HIGH SENSITIVITY    EKG: None  Radiology: CT Maxillofacial Wo Contrast Result Date: 03/21/2024 CLINICAL DATA:  Sinus pain EXAM: CT MAXILLOFACIAL WITHOUT CONTRAST TECHNIQUE: Multidetector CT imaging of the maxillofacial structures was performed. Multiplanar CT image reconstructions were also generated. RADIATION DOSE REDUCTION: This exam was performed according to the departmental dose-optimization program which includes automated exposure control, adjustment of the mA and/or kV according to patient size and/or use of iterative reconstruction technique. COMPARISON:  02/27/2024 FINDINGS: Osseous: No fracture or mandibular dislocation. No destructive process. Orbits: Negative. No traumatic or inflammatory finding. Sinuses: There is complete opacification of the frontal and maxillary sinuses. Opacification of the anterior ethmoid air cells. Sphenoid sinus is patent. There is occlusion of the ostiomeatal complexes bilaterally. Soft tissues: Negative. Limited intracranial: No significant or unexpected finding. IMPRESSION: 1. Frontal, maxillary, and ethmoid sinus disease as above. Complete occlusion of the ostiomeatal complexes bilaterally. Electronically Signed   By: Ozell Daring M.D.   On: 03/21/2024 16:55   CT Angio Chest PE W and/or Wo Contrast Result Date: 03/21/2024 CLINICAL DATA:  Pulmonary embolism suspected, high probability. EXAM: CT ANGIOGRAPHY CHEST WITH CONTRAST TECHNIQUE: Multidetector CT imaging of the chest was performed using the standard protocol during bolus administration of intravenous contrast. Multiplanar CT image reconstructions and  MIPs were obtained to evaluate the vascular anatomy. RADIATION DOSE REDUCTION: This exam was performed according to the departmental dose-optimization program which includes automated exposure control, adjustment of the mA and/or kV according to patient size and/or use of iterative reconstruction technique. CONTRAST:  75mL OMNIPAQUE  IOHEXOL  350 MG/ML SOLN COMPARISON:  None Available. FINDINGS: Cardiovascular: The heart is normal in size and there is no significant pericardial effusion. Three-vessel coronary artery calcifications are noted. There is atherosclerotic calcification of the aorta without evidence of aneurysm. The pulmonary trunk is normal in caliber. Evidence of pulmonary embolism is seen. Mediastinum/Nodes: No mediastinal, hilar, or axillary lymphadenopathy is seen. The thyroid  gland, trachea, and esophagus are within normal limits. There is a small hiatal hernia. Lungs/Pleura: Bronchial wall thickening is present bilaterally. Atelectasis is noted at the lung bases bilaterally. No effusion or pneumothorax is seen. Bronchiectasis is present in the left lower lobe. Upper Abdomen: No acute abnormality. Musculoskeletal: Sternotomy wires are noted. There are degenerative changes in the thoracic spine. No acute osseous abnormality is seen. Loop recorder device is present in the anterior chest wall on the left. Review of the MIP images confirms the above findings. IMPRESSION: 1. No evidence of pulmonary embolism. 2. Bronchiectasis in the left lower lobe with bronchial wall thickening bilaterally, which may be infectious or inflammatory. 3. Small hiatal hernia. 4. Coronary artery calcifications. 5. Aortic atherosclerosis. Electronically Signed   By: Leita Birmingham M.D.   On: 03/21/2024 16:47   DG Chest 2 View Result Date: 03/21/2024 CLINICAL DATA:  Shortness of breath. Hypoxia. Colon carcinoma. Coronary artery disease. EXAM: CHEST - 2 VIEW COMPARISON:  02/27/2024 FINDINGS: The heart size and mediastinal  contours are within normal limits. Prior CABG again noted. Cardiac loop recorder again seen in the left anterior chest wall. Both lungs are clear. The visualized skeletal structures are unremarkable. IMPRESSION: No active cardiopulmonary disease. Electronically Signed   By: Norleen DELENA Kil M.D.   On: 03/21/2024 14:17     .Critical Care  Performed by: Torrence Marry RAMAN, PA-C Authorized by: Torrence Marry RAMAN, PA-C   Critical care provider statement:    Critical care  time (minutes):  50   Critical care was necessary to treat or prevent imminent or life-threatening deterioration of the following conditions:  Respiratory failure   Critical care was time spent personally by me on the following activities:  Blood draw for specimens, development of treatment plan with patient or surrogate, discussions with consultants, discussions with primary provider, evaluation of patient's response to treatment, examination of patient, obtaining history from patient or surrogate, re-evaluation of patient's condition, pulse oximetry, ordering and review of radiographic studies, ordering and review of laboratory studies and ordering and performing treatments and interventions   I assumed direction of critical care for this patient from another provider in my specialty: no     Care discussed with: admitting provider      Medications Ordered in the ED  oseltamivir  (TAMIFLU ) capsule 75 mg (has no administration in time range)    Followed by  oseltamivir  (TAMIFLU ) capsule 30 mg (has no administration in time range)  aspirin  EC tablet 81 mg (has no administration in time range)  amiodarone  (PACERONE ) tablet 100 mg (has no administration in time range)  simvastatin  (ZOCOR ) tablet 20 mg (has no administration in time range)  tamsulosin  (FLOMAX ) capsule 0.4 mg (has no administration in time range)  azelastine  (ASTELIN ) 0.1 % nasal spray 2 spray (has no administration in time range)  fluticasone  (FLONASE ) 50 MCG/ACT nasal  spray 1 spray (has no administration in time range)  irbesartan  (AVAPRO ) tablet 150 mg (has no administration in time range)  pantoprazole  (PROTONIX ) EC tablet 40 mg (has no administration in time range)  FLUoxetine  (PROZAC ) capsule 10 mg (has no administration in time range)  furosemide  (LASIX ) injection 20 mg (has no administration in time range)  enoxaparin  (LOVENOX ) injection 40 mg (has no administration in time range)  acetaminophen  (TYLENOL ) tablet 650 mg (has no administration in time range)    Or  acetaminophen  (TYLENOL ) suppository 650 mg (has no administration in time range)  iohexol  (OMNIPAQUE ) 350 MG/ML injection 75 mL (75 mLs Intravenous Contrast Given 03/21/24 1611)  lactated ringers  bolus 500 mL (500 mLs Intravenous New Bag/Given 03/21/24 1704)    Clinical Course as of 03/21/24 1939  Sat Mar 21, 2024  1416 CT tech informed me that patient refused CT max face. [GD]    Clinical Course User Index [GD] Torrence Marry RAMAN, PA-C                                Medical Decision Making Amount and/or Complexity of Data Reviewed Labs: ordered. Radiology: ordered.  Risk OTC drugs. Prescription drug management. Decision regarding hospitalization.   This patient presents to the ED for concern of weakness, fever, this involves an extensive number of treatment options, and is a complaint that carries with it a high risk of complications and morbidity.   Differential diagnosis includes: Flu, COVID, pneumonia, sepsis, UTI, pyelonephritis, anemia, malignancy  Co morbidities:  colon cancer, CAD  Social Determinants of Health:     Additional history:  patient reported to medical oncology yesterday for consultation for iron  deficiency anemia and thrombocytosis.  When he presented yesterday, he did have a fever of 101 and was sent home to reschedule his visit.  Lab Tests:  I Ordered, and personally interpreted labs.  The pertinent results include:   - BNP: 1667 - Troponin:  38 - Flu positive   Imaging Studies:  I ordered imaging studies including chest x-ray, CT a PE, CT max  face I independently visualized and interpreted imaging which showed no acute cardiopulmonary abnormality or evidence of PE I agree with the radiologist interpretation  Cardiac Monitoring/ECG:  The patient was maintained on a cardiac monitor.  I personally viewed and interpreted the cardiac monitored which showed an underlying rhythm of: Sinus rhythm  Medicines ordered and prescription drug management:  I ordered medication including  Medications  oseltamivir  (TAMIFLU ) capsule 75 mg (has no administration in time range)    Followed by  oseltamivir  (TAMIFLU ) capsule 30 mg (has no administration in time range)  aspirin  EC tablet 81 mg (has no administration in time range)  amiodarone  (PACERONE ) tablet 100 mg (has no administration in time range)  simvastatin  (ZOCOR ) tablet 20 mg (has no administration in time range)  tamsulosin  (FLOMAX ) capsule 0.4 mg (has no administration in time range)  azelastine  (ASTELIN ) 0.1 % nasal spray 2 spray (has no administration in time range)  fluticasone  (FLONASE ) 50 MCG/ACT nasal spray 1 spray (has no administration in time range)  irbesartan  (AVAPRO ) tablet 150 mg (has no administration in time range)  pantoprazole  (PROTONIX ) EC tablet 40 mg (has no administration in time range)  FLUoxetine  (PROZAC ) capsule 10 mg (has no administration in time range)  furosemide  (LASIX ) injection 20 mg (has no administration in time range)  enoxaparin  (LOVENOX ) injection 40 mg (has no administration in time range)  acetaminophen  (TYLENOL ) tablet 650 mg (has no administration in time range)    Or  acetaminophen  (TYLENOL ) suppository 650 mg (has no administration in time range)  iohexol  (OMNIPAQUE ) 350 MG/ML injection 75 mL (75 mLs Intravenous Contrast Given 03/21/24 1611)  lactated ringers  bolus 500 mL (500 mLs Intravenous New Bag/Given 03/21/24 1704)   for fever  and pain, Reevaluation of the patient after these medicines showed that the patient improved I have reviewed the patients home medicines and have made adjustments as needed  Test Considered:   none  Critical Interventions:   supplemental oxygen due to hypoxia, multiple life-saving interventions due to hypoxia  Consultations Obtained: Hospitalist  Problem List / ED Course:     ICD-10-CM   1. Acute hypoxic respiratory failure (HCC)  J96.01     2. Influenza A  J10.1     3. Elevated brain natriuretic peptide (BNP) level  R79.89       MDM: 89 year old male who presents emergency department for evaluation of weakness and fever.  Patient was scheduled to receive an iron  transfusion yesterday, but was sent home due to low-grade fever.  Today he is reporting fatigue and weakness with associated cough and congestion.  Infection workup has been started.  Upon reevaluation, patient's respiratory panel is positive for the flu, he has an elevated troponin of 38 and proBNP elevated of 1,667, suggesting right heart strain due to influenza virus.  I did obtain a CTA PE rule out study.  Results are negative for PE.  Ultimately, I feel patient would benefit from hospital admission due to hypoxia secondary to positive influenza test, causing right heart strain.  I do deem this patient to be critically ill.  Consult to hospitalist has been placed.  Dr. Gala Will be the excepting provider.   Dispostion:  After consideration of the diagnostic results and the patients response to treatment, I feel that the patient would benefit from hospital admission.   Final diagnoses:  Acute hypoxic respiratory failure (HCC)  Influenza A  Elevated brain natriuretic peptide (BNP) level    ED Discharge Orders     None  Torrence Marry RAMAN, PA-C 03/21/24 1939    Jerrol Agent, MD 03/21/24 1940  "

## 2024-03-21 NOTE — Assessment & Plan Note (Signed)
 Hyponatremia of 129 which is new Hold chlorthalidone   May be contributing to weakness Mild CHF exacerbation Continue to trend Check urine studies if worsens

## 2024-03-21 NOTE — Assessment & Plan Note (Signed)
 Well controlled Continue diovan  160mg  daily Hold chlorthalidone  with hyponatremia

## 2024-03-21 NOTE — Assessment & Plan Note (Signed)
 Keep on tele  Continue amiodarone 

## 2024-03-21 NOTE — H&P (Signed)
 " History and Physical    Patient: Michael Bryant FMW:[id number] DOB: 1933-06-11 DOA: 03/21/2024 DOS: the patient was seen and examined on 03/21/2024 PCP: Corlis Pagan, NP  Patient coming from: Home - lives with his wife. Ambulates independently.    Chief Complaint: weakness and shortness of breath   HPI: Michael Bryant is a 89 y.o. male with medical history significant of CAD with VF arrest s/p CABG in 2003, HTN, HLD, VT on amiodarone , combined CHF and recent diagnosis of colon cancer who presented to ED with complaints of weakness. Noted to have fever to 101 at oncology clinic yesterday. He has baseline weakness and shortness of breath, but over the last couple of days he could hardly complete a sentence and feels like he is going fall out. He has dyspnea on exertion. He has to have stools placed to make it 12 feet to rest. He does have a cough, but this is normal for him. He does report orthopnea, but states this has been going on x 1 month. He has weight loss, no edema. Fever started yesterday and has not had another fever since yesterday. He has had a right sided headache, mild. He also has congestion, but this is chronic in nature. No known sick contacts. He doesn't drink a lot of fluids, poor water intake. He eats a good breakfast and eats a meal mid afternoon and that is all he eats. He has decreased appetite, he has lost about 10 pounds since Thanksgiving.   Weakness has been going on since Thanksgiving and shortly after this his shortness of breath started.    Denies any vision changes/headaches, chest pain or palpitations, abdominal pain, N/V/D, dysuria or leg swelling.     He does not smoke or drink alcohol.   ER Course:  vitals: afebrile, bp: 120/57, HR: 65, RR: 14, oxygen: 94% RA Pertinent labs: hgb: 9.8, platelets 423, sodium: 129, creatinine: 1.28, bnp: 1667, FLU: POSITIVE, troponin: 38>35 CXR: clear CT maxillofacial:  Frontal, maxillary, and ethmoid sinus disease as above. Complete  occlusion of the ostiomeatal complexes bilaterally. CTA chest: No PE. Bronchiectasis in LLL with bronchial wall thickening bilaterally. Infectious vs. Inflammatory. Small hiatal hernia. Coronary artery calcifications. Aortic atherosclerosis.  In ED: given 500cc IVF bolus and BC obtained. TRH asked to admit.    Review of Systems: As mentioned in the history of present illness. All other systems reviewed and are negative. Past Medical History:  Diagnosis Date   CAD (coronary artery disease)    VF arrest >>  s/p CABG (2003) // Myoview  1/15: no ischemia // Cath 4/15: all grafts patent   HTN (hypertension)    Hx of VF arrest in 2003 2003   Hyperlipidemia    NSVT (nonsustained ventricular tachycardia) (HCC) 06/16/2013   Rx with amiodarone  // Echo 6/17: EF 55-60, Gr 1 DD, apical HK   Past Surgical History:  Procedure Laterality Date   CARDIAC CATHETERIZATION  12/15/2001   60% proximal LAD narrowing with active mobile thrombosis w/50% sequential areas,50-60% pro   CORONARY ARTERY BYPASS GRAFT  2004   LIMA to LAD, SVG to OM, SVG to RCA   LEFT HEART CATHETERIZATION WITH CORONARY/GRAFT ANGIOGRAM N/A 07/02/2013   Procedure: LEFT HEART CATHETERIZATION WITH EL BILE;  Surgeon: Jerel Balding, MD;  Location: [facility] CATH [facility];  Service: Cardiovascular;  Laterality: N/A;   NM MYOCAR PERF WALL MOTION  12/09/2009   mod. perfusion defect basal inferior and mid inferior regions c/w  infarct/scar, EF 54%   TONSILLECTOMY AND  ADENOIDECTOMY     Social History:  reports that he has quit smoking. His smoking use included cigarettes. He has quit using smokeless tobacco. He reports that he does not drink alcohol and does not use drugs.  Allergies[1]  Family History  Problem Relation Age of Onset   Cancer Mother 43   CAD Father 41       Died age 32   Heart disease Father     Prior to Admission medications  Medication Sig Start Date End Date Taking? Authorizing Provider  amiodarone  (PACERONE ) 200  MG tablet Take 0.5 tablets (100 mg total) by mouth daily. 06/03/23   Croitoru, Mihai, MD  aspirin  EC 81 MG tablet Take 81 mg by mouth daily.    [provider]  azelastine  (ASTELIN ) 0.1 % nasal spray Place 2 sprays into both nostrils 2 (two) times daily. Use in each nostril as directed 12/25/23   Hedges, Reyes, PA-C  beta carotene w/minerals (OCUVITE) tablet Take 1 tablet by mouth daily.    [provider]  chlorpheniramine (CHLOR-TRIMETON) 4 MG tablet Take 4 mg by mouth daily as needed (allergies).    [provider]  chlorthalidone  (HYGROTON ) 25 MG tablet TAKE 1 TABLET(25 MG) BY MOUTH DAILY 04/15/23   Croitoru, Mihai, MD  Cholecalciferol (VITAMIN D ) 50 MCG (2000 UT) CAPS Take 2,000 Units by mouth daily.    [provider]  CINNAMON PO Take 1 tablet by mouth daily.     [provider]  DICLOFENAC  PO Take 100 mg by mouth daily.     [provider]  Diclofenac  Sodium CR 100 MG 24 hr tablet Take 100 mg by mouth daily as needed.    [provider]  fish oil-omega-3 fatty acids 1000 MG capsule Take 1 g by mouth daily.     [provider]  GELATIN PO Take 9 g by mouth daily.    [provider]  Glucosamine 500 MG CAPS daily.    [provider]  methylPREDNISolone  (MEDROL  DOSEPAK) 4 MG TBPK tablet Take with signs of chronic sinusitis and take as directed 12/11/23   Palmer Reyes, PA-C  Misc Natural Products (PROSTATE HEALTH) CAPS Take 1 capsule by mouth daily.    [provider]  mometasone  (NASONEX ) 50 MCG/ACT nasal spray Place 2 sprays into the nose daily. 11/15/23   Hedges, Reyes, PA-C  nitroGLYCERIN  (NITROSTAT ) 0.4 MG SL tablet Place 1 tablet (0.4 mg total) under the tongue every 5 (five) minutes as needed for chest pain. 02/27/21   Croitoru, Mihai, MD  omeprazole (PRILOSEC) 10 MG capsule Take 10 mg by mouth daily.    [provider]  Polyethyl Glycol-Propyl Glycol (SYSTANE OP) Place 1 drop  into both eyes daily as needed (dry eyes).    [provider]  simvastatin  (ZOCOR ) 20 MG tablet Take 1 tablet (20 mg total) by mouth daily at 6 PM. 03/02/24   Croitoru, Mihai, MD  tamsulosin  (FLOMAX ) 0.4 MG CAPS capsule Take 0.4 mg by mouth daily after supper.  03/02/13   [provider]  valsartan  (DIOVAN ) 160 MG tablet TAKE 1 TABLET(160 MG) BY MOUTH DAILY 04/29/23   Francyne Headland, MD    Physical Exam: Vitals:   03/21/24 1635 03/21/24 1745 03/21/24 1839 03/21/24 2149  BP: 131/61 (!) 151/61 126/84 (!) 153/68  Pulse: 67 61 66 75  Resp: (!) 21 16 20 20   Temp: 98.1 F (36.7 C)  97.9 F (36.6 C) 99.1 F (37.3 C)  TempSrc: Oral  Oral  Oral  SpO2: 95% 98% 95% 96%  Weight:   81.5 kg   Height:   5' 10 (1.778 m)    General:  Appears calm and comfortable and is in NAD Eyes:  PERRL, EOMI, normal lids, iris. Blind.  ENT:  grossly normal hearing, lips & tongue, mmm; appropriate dentition Neck:  no LAD, masses or thyromegaly; no carotid bruits Cardiovascular:  RRR, no m/r/g. No LE edema.  Respiratory:   Lungs with quiet crackles in LLL. Very occasional expiratory wheeze. Good air movement.   Normal respiratory effort. Abdomen:  soft, NT, ND, NABS Back:   normal alignment, no CVAT Skin:  no rash or induration seen on limited exam Musculoskeletal:  grossly normal tone BUE/BLE, good ROM, no bony abnormality. Globally weak.  Lower extremity:  No LE edema.  Limited foot exam with no ulcerations.  2+ distal pulses. Psychiatric:  grossly normal mood and affect, speech fluent and appropriate, AOx3 Neurologic:  CN 2-12 grossly intact, moves all extremities in coordinated fashion, sensation intact   Radiological Exams on Admission: Independently reviewed - see discussion in A/P where applicable  CT Maxillofacial Wo Contrast Result Date: 03/21/2024 CLINICAL DATA:  Sinus pain EXAM: CT MAXILLOFACIAL WITHOUT CONTRAST TECHNIQUE: Multidetector CT imaging of the maxillofacial structures  was performed. Multiplanar CT image reconstructions were also generated. RADIATION DOSE REDUCTION: This exam was performed according to the departmental dose-optimization program which includes automated exposure control, adjustment of the mA and/or kV according to patient size and/or use of iterative reconstruction technique. COMPARISON:  02/27/2024 FINDINGS: Osseous: No fracture or mandibular dislocation. No destructive process. Orbits: Negative. No traumatic or inflammatory finding. Sinuses: There is complete opacification of the frontal and maxillary sinuses. Opacification of the anterior ethmoid air cells. Sphenoid sinus is patent. There is occlusion of the ostiomeatal complexes bilaterally. Soft tissues: Negative. Limited intracranial: No significant or unexpected finding. IMPRESSION: 1. Frontal, maxillary, and ethmoid sinus disease as above. Complete occlusion of the ostiomeatal complexes bilaterally. Electronically Signed   By: Ozell Daring M.D.   On: 03/21/2024 16:55   CT Angio Chest PE W and/or Wo Contrast Result Date: 03/21/2024 CLINICAL DATA:  Pulmonary embolism suspected, high probability. EXAM: CT ANGIOGRAPHY CHEST WITH CONTRAST TECHNIQUE: Multidetector CT imaging of the chest was performed using the standard protocol during bolus administration of intravenous contrast. Multiplanar CT image reconstructions and MIPs were obtained to evaluate the vascular anatomy. RADIATION DOSE REDUCTION: This exam was performed according to the departmental dose-optimization program which includes automated exposure control, adjustment of the mA and/or kV according to patient size and/or use of iterative reconstruction technique. CONTRAST:  75mL OMNIPAQUE  IOHEXOL  350 MG/ML SOLN COMPARISON:  None Available. FINDINGS: Cardiovascular: The heart is normal in size and there is no significant pericardial effusion. Three-vessel coronary artery calcifications are noted. There is atherosclerotic calcification of the aorta  without evidence of aneurysm. The pulmonary trunk is normal in caliber. Evidence of pulmonary embolism is seen. Mediastinum/Nodes: No mediastinal, hilar, or axillary lymphadenopathy is seen. The thyroid  gland, trachea, and esophagus are within normal limits. There is a small hiatal hernia. Lungs/Pleura: Bronchial wall thickening is present bilaterally. Atelectasis is noted at the lung bases bilaterally. No effusion or pneumothorax is seen. Bronchiectasis is present in the left lower lobe. Upper Abdomen: No acute abnormality. Musculoskeletal: Sternotomy wires are noted. There are degenerative changes in the thoracic spine. No acute osseous abnormality is seen. Loop recorder device is present in the anterior chest wall on the left. Review of the MIP images confirms the  above findings. IMPRESSION: 1. No evidence of pulmonary embolism. 2. Bronchiectasis in the left lower lobe with bronchial wall thickening bilaterally, which may be infectious or inflammatory. 3. Small hiatal hernia. 4. Coronary artery calcifications. 5. Aortic atherosclerosis. Electronically Signed   By: Leita Birmingham M.D.   On: 03/21/2024 16:47   DG Chest 2 View Result Date: 03/21/2024 CLINICAL DATA:  Shortness of breath. Hypoxia. Colon carcinoma. Coronary artery disease. EXAM: CHEST - 2 VIEW COMPARISON:  02/27/2024 FINDINGS: The heart size and mediastinal contours are within normal limits. Prior CABG again noted. Cardiac loop recorder again seen in the left anterior chest wall. Both lungs are clear. The visualized skeletal structures are unremarkable. IMPRESSION: No active cardiopulmonary disease. Electronically Signed   By: Norleen DELENA Kil M.D.   On: 03/21/2024 14:17    EKG: Independently reviewed.  NSR with rate 64; nonspecific ST changes with no evidence of acute ischemia. Prolonged PR. Similar, 1st degree AV block on previous    Labs on Admission: I have personally reviewed the available labs and imaging studies at the time of the  admission.  Pertinent labs:   hgb: 9.8,  platelets 423,  sodium: 129,  creatinine: 1.28,  bnp: 1667,  FLU: POSITIVE,  troponin: 38>  Assessment and Plan: Principal Problem:   Acute respiratory failure with hypoxia (HCC) Active Problems:   Influenza A   Acute on chronic combined systolic and diastolic CHF (congestive heart failure) (HCC)   Failure to thrive in adult   Hyponatremia   Normocytic anemia   Colonic mass   Chronic sinusitis   Essential hypertension   CKD stage 3a, GFR 45-59 ml/min (HCC)   CAD (coronary artery disease), hx CABG 2003   Hypercholesterolemia   NSVT (nonsustained ventricular tachycardia) (HCC)    Assessment and Plan: * Acute respiratory failure with hypoxia (HCC) 89 year old male recently diagnosed with colonic mass concerning for malignancy presented to ED with worsening shortness of breath, weakness and FTT found to be hypoxic to 88% on RA requiring 2L to maintain saturation -admit to tele  -found to be flu A positive which likely contributing. Start tamiflu , CXR clear -likely has component of acute on chronic combined CHF exacerbation see # 3 -CTA negative for PE  -new anemia, but stable with  hgb of 9.8  -continue to treat above and wean as tolerated   Influenza A Start tamiflu   CXR clear, no pneumonia, normal lactic acid Albuterol  PRN  Droplet and contact precautions   Acute on chronic combined systolic and diastolic CHF (congestive heart failure) (HCC) -echo from 04/2023 showed mildly reduced EF of 45-50% with grade 1 DD -cardiac PET with EF of 43% in 07/2023 -clinically appears euvolemic with no pulmonary edema or effusion on chest imaging -BNP >1600 and has worsening dyspnea on exertion, hypoxia and orthopnea that has worsened over last few weeks -likely has some CHF exacerbation in setting of the flu -lasix  naive, give 20mg  IV lasix  now  -strict I/O and daily weights  -repeat echo pending -GDMT per echo   Failure to thrive in  adult -FTT with worsening weakness, appetite since late Feb 06, 2025, but more pronounced over past few weeks  -multifactorial but has anemia, new colonic mass concerning for malignancy and CHF as well as mild hyponatremia  -PT/OT consult -would likely benefit from palliative care consult   Hyponatremia Hyponatremia of 129 which is new Hold chlorthalidone   May be contributing to weakness Mild CHF exacerbation Continue to trend Check urine studies if worsens  Normocytic anemia New finding in setting of new colonic mass  Appears to be iron  deficiency trend Iron  studies done 02/27/24 Started on oral iron  twice weekly due to constipation  Hgb stable, denies any active bleeding   Colonic mass -colonoscopy 03/18/2024 with colonic mass concerning for malignancy -work up underway  -scheduled for CT CAP next week on 03/23/24  -urgent referral to surgery placed -no results back on pathology that I can find in care everywhere  -oncology consult moved to 03/24/24   Chronic sinusitis CT maxillary sinus today: Frontal, maxillary, and ethmoid sinus disease as above. Complete occlusion of the ostiomeatal complexes bilaterally. Family states has seen ENT multiple times and allergist  Wanted to do procedure, but with everything going on holding off    Essential hypertension Well controlled Continue diovan  160mg  daily Hold chlorthalidone  with hyponatremia    CKD stage 3a, GFR 45-59 ml/min (HCC) Baseline creatinine appears to be around 1.2-1.3  Stable  CAD (coronary artery disease), hx CABG 2003 Continue medical management  Troponin mildly bumped, but not much from baseline Likely demand ischemia in setting of hypoxia He has no chest pain, EKG pending CTA chest negative for PE    Hypercholesterolemia Continue zocor  20mg  daily   NSVT (nonsustained ventricular tachycardia) (HCC) Keep on tele  Continue amiodarone         Advance Care Planning:   Code Status: Full Code     Consultants: PT/OT/nutrition    Procedures: CTA chest 03/21/24    Antibiotics: None, tamiflu  started 03/21/24     DVT Prophylaxis: lovenox    Family Communication: wife and daughter at bedside   Severity of Illness: The appropriate patient status for this patient is INPATIENT. Inpatient status is judged to be reasonable and necessary in order to provide the required intensity of service to ensure the patient's safety. The patient's presenting symptoms, physical exam findings, and initial radiographic and laboratory data in the context of their chronic comorbidities is felt to place them at high risk for further clinical deterioration. Furthermore, it is not anticipated that the patient will be medically stable for discharge from the hospital within 2 midnights of admission.   * I certify that at the point of admission it is my clinical judgment that the patient will require inpatient hospital care spanning beyond 2 midnights from the point of admission due to high intensity of service, high risk for further deterioration and high frequency of surveillance required.*  Author: Isaiah Geralds, MD 03/21/2024 11:19 PM  For on call review [email or web address].      [1]  Allergies Allergen Reactions   Codeine Other (See Comments) and Hives    wont go to sleep   "

## 2024-03-21 NOTE — Assessment & Plan Note (Signed)
 CT maxillary sinus today: Frontal, maxillary, and ethmoid sinus disease as above. Complete occlusion of the ostiomeatal complexes bilaterally. Family states has seen ENT multiple times and allergist  Wanted to do procedure, but with everything going on holding off

## 2024-03-21 NOTE — Assessment & Plan Note (Addendum)
 New finding in setting of new colonic mass  Appears to be iron  deficiency trend Iron  studies done 02/27/24 Started on oral iron  twice weekly due to constipation  Hgb stable, denies any active bleeding

## 2024-03-21 NOTE — ED Triage Notes (Signed)
 Patient BIB EMS for generalized weakness and malaise. Patient was diagnosed with Colon Cancer. Went to have Iron  Infusion yesterday and had a fever so he did not get his infusion. Sent home and states he is feeling worse today.   Per EMS: CBG 180 98.8 O2 sat 90%, up to 99% on 2 liters 123/60 16 67

## 2024-03-21 NOTE — Assessment & Plan Note (Addendum)
 Start tamiflu   CXR clear, no pneumonia, normal lactic acid Albuterol  PRN  Droplet and contact precautions

## 2024-03-21 NOTE — Assessment & Plan Note (Addendum)
-  FTT with worsening weakness, appetite since late 2025/01/30, but more pronounced over past few weeks  -multifactorial but has anemia, new colonic mass concerning for malignancy and CHF as well as mild hyponatremia  -PT/OT consult -would likely benefit from palliative care consult

## 2024-03-22 ENCOUNTER — Inpatient Hospital Stay (HOSPITAL_COMMUNITY)

## 2024-03-22 ENCOUNTER — Other Ambulatory Visit: Payer: Self-pay

## 2024-03-22 DIAGNOSIS — J329 Chronic sinusitis, unspecified: Secondary | ICD-10-CM | POA: Diagnosis not present

## 2024-03-22 DIAGNOSIS — D649 Anemia, unspecified: Secondary | ICD-10-CM

## 2024-03-22 DIAGNOSIS — J101 Influenza due to other identified influenza virus with other respiratory manifestations: Secondary | ICD-10-CM

## 2024-03-22 DIAGNOSIS — E871 Hypo-osmolality and hyponatremia: Secondary | ICD-10-CM

## 2024-03-22 DIAGNOSIS — I10 Essential (primary) hypertension: Secondary | ICD-10-CM

## 2024-03-22 DIAGNOSIS — I5021 Acute systolic (congestive) heart failure: Secondary | ICD-10-CM | POA: Diagnosis not present

## 2024-03-22 DIAGNOSIS — I2510 Atherosclerotic heart disease of native coronary artery without angina pectoris: Secondary | ICD-10-CM

## 2024-03-22 DIAGNOSIS — J9601 Acute respiratory failure with hypoxia: Secondary | ICD-10-CM | POA: Diagnosis not present

## 2024-03-22 DIAGNOSIS — E7800 Pure hypercholesterolemia, unspecified: Secondary | ICD-10-CM | POA: Diagnosis not present

## 2024-03-22 DIAGNOSIS — N1831 Chronic kidney disease, stage 3a: Secondary | ICD-10-CM | POA: Diagnosis not present

## 2024-03-22 DIAGNOSIS — K6389 Other specified diseases of intestine: Secondary | ICD-10-CM

## 2024-03-22 DIAGNOSIS — I5043 Acute on chronic combined systolic (congestive) and diastolic (congestive) heart failure: Secondary | ICD-10-CM | POA: Diagnosis not present

## 2024-03-22 DIAGNOSIS — R627 Adult failure to thrive: Secondary | ICD-10-CM | POA: Diagnosis not present

## 2024-03-22 DIAGNOSIS — I4729 Other ventricular tachycardia: Secondary | ICD-10-CM

## 2024-03-22 LAB — COMPREHENSIVE METABOLIC PANEL WITH GFR
ALT: 66 U/L — ABNORMAL HIGH (ref 0–44)
AST: 62 U/L — ABNORMAL HIGH (ref 15–41)
Albumin: 2.8 g/dL — ABNORMAL LOW (ref 3.5–5.0)
Alkaline Phosphatase: 76 U/L (ref 38–126)
Anion gap: 10 (ref 5–15)
BUN: 20 mg/dL (ref 8–23)
CO2: 30 mmol/L (ref 22–32)
Calcium: 8.8 mg/dL — ABNORMAL LOW (ref 8.9–10.3)
Chloride: 92 mmol/L — ABNORMAL LOW (ref 98–111)
Creatinine, Ser: 1.09 mg/dL (ref 0.61–1.24)
GFR, Estimated: >60 mL/min
Glucose, Bld: 109 mg/dL — ABNORMAL HIGH (ref 70–99)
Potassium: 3.4 mmol/L — ABNORMAL LOW (ref 3.5–5.1)
Sodium: 132 mmol/L — ABNORMAL LOW (ref 135–145)
Total Bilirubin: 0.3 mg/dL (ref 0.0–1.2)
Total Protein: 6.4 g/dL — ABNORMAL LOW (ref 6.5–8.1)

## 2024-03-22 LAB — ECHOCARDIOGRAM COMPLETE
Area-P 1/2: 2.83 cm2
Calc EF: 46.9 %
Height: 70 in
S' Lateral: 3 cm
Single Plane A2C EF: 51.8 %
Single Plane A4C EF: 46.5 %
Weight: 2853.63 [oz_av]

## 2024-03-22 LAB — RETICULOCYTES
Immature Retic Fract: 12.1 % (ref 2.3–15.9)
RBC.: 2.96 MIL/uL — ABNORMAL LOW (ref 4.22–5.81)
Retic Count, Absolute: 76.7 K/uL (ref 19.0–186.0)
Retic Ct Pct: 2.6 % (ref 0.4–3.1)

## 2024-03-22 LAB — IRON AND TIBC
Iron: 15 ug/dL — ABNORMAL LOW (ref 45–182)
Saturation Ratios: 7 % — ABNORMAL LOW (ref 17.9–39.5)
TIBC: 214 ug/dL — ABNORMAL LOW (ref 250–450)
UIBC: 199 ug/dL

## 2024-03-22 LAB — CBC
HCT: 25.2 % — ABNORMAL LOW (ref 39.0–52.0)
Hemoglobin: 8.4 g/dL — ABNORMAL LOW (ref 13.0–17.0)
MCH: 28.7 pg (ref 26.0–34.0)
MCHC: 33.3 g/dL (ref 30.0–36.0)
MCV: 86 fL (ref 80.0–100.0)
Platelets: 380 K/uL (ref 150–400)
RBC: 2.93 MIL/uL — ABNORMAL LOW (ref 4.22–5.81)
RDW: 14.4 % (ref 11.5–15.5)
WBC: 5 K/uL (ref 4.0–10.5)
nRBC: 0 % (ref 0.0–0.2)

## 2024-03-22 LAB — FERRITIN: Ferritin: 249 ng/mL (ref 24–336)

## 2024-03-22 LAB — VITAMIN B12: Vitamin B-12: 1158 pg/mL — ABNORMAL HIGH (ref 180–914)

## 2024-03-22 LAB — FOLATE: Folate: 12.2 ng/mL

## 2024-03-22 MED ORDER — GUAIFENESIN-DM 100-10 MG/5ML PO SYRP
5.0000 mL | ORAL_SOLUTION | ORAL | Status: DC | PRN
  Administered 2024-03-22 – 2024-03-23 (×2): 5 mL via ORAL
  Filled 2024-03-22 (×2): qty 10

## 2024-03-22 MED ORDER — LORATADINE 10 MG PO TABS
10.0000 mg | ORAL_TABLET | Freq: Every day | ORAL | Status: DC | PRN
  Administered 2024-03-22: 10 mg via ORAL
  Filled 2024-03-22: qty 1

## 2024-03-22 MED ORDER — POTASSIUM CHLORIDE CRYS ER 20 MEQ PO TBCR
60.0000 meq | EXTENDED_RELEASE_TABLET | Freq: Once | ORAL | Status: AC
  Administered 2024-03-22: 60 meq via ORAL
  Filled 2024-03-22: qty 3

## 2024-03-22 MED ORDER — IRON SUCROSE 300 MG IVPB - SIMPLE MED
300.0000 mg | Freq: Once | Status: AC
  Administered 2024-03-22: 300 mg via INTRAVENOUS
  Filled 2024-03-22: qty 300

## 2024-03-22 MED ORDER — FLUTICASONE PROPIONATE 50 MCG/ACT NA SUSP
2.0000 | Freq: Every day | NASAL | Status: DC
  Administered 2024-03-23 – 2024-03-24 (×2): 2 via NASAL
  Filled 2024-03-22: qty 16

## 2024-03-22 MED ORDER — SALINE SPRAY 0.65 % NA SOLN
2.0000 | Freq: Three times a day (TID) | NASAL | Status: DC
  Administered 2024-03-22 – 2024-03-24 (×5): 2 via NASAL
  Filled 2024-03-22: qty 44

## 2024-03-22 NOTE — Care Management Obs Status (Signed)
 MEDICARE OBSERVATION STATUS NOTIFICATION   Patient Details  Name: PERFECTO PURDY MRN: [id number] Date of Birth: 1933-07-24   Medicare Observation Status Notification Given:  Yes    Sonda Manuella Quill, RN 03/22/2024, 5:07 PM

## 2024-03-22 NOTE — Progress Notes (Signed)
 Mobility Specialist - Progress Note   03/22/24 1228  Mobility  Activity Ambulated with assistance  Level of Assistance Standby assist, set-up cues, supervision of patient - no hands on  Assistive Device None  Distance Ambulated (ft) 200 ft  Range of Motion/Exercises Active  Activity Response Tolerated well  Mobility visit 1 Mobility  Mobility Specialist Start Time (ACUTE ONLY) 1215  Mobility Specialist Stop Time (ACUTE ONLY) 1228  Mobility Specialist Time Calculation (min) (ACUTE ONLY) 13 min   Pt was found in bed trying to get up to use bathroom. Assisted to bathroom and agreeable to mobilize afterwards. No complaints. At EOS returned to bed with all needs met. Call bell in reach.  Erminio Leos,  Mobility Specialist Can be reached via Secure Chat

## 2024-03-22 NOTE — Progress Notes (Signed)
" °  2D Echocardiogram has been performed.  Michael Bryant 03/22/2024, 9:53 AM "

## 2024-03-22 NOTE — Care Management CC44 (Signed)
"         Condition Code 44 Documentation Completed  Patient Details  Name: Michael Bryant MRN: [id number] Date of Birth: 04-29-1933   Condition Code 44 given:  Yes Patient signature on Condition Code 44 notice:  Yes Documentation of 2 MD's agreement:  Yes Code 44 added to claim:  Yes    Sonda Manuella Quill, RN 03/22/2024, 5:07 PM  "

## 2024-03-22 NOTE — Progress Notes (Signed)
 " PROGRESS NOTE  Michael Bryant FMW:[id number] DOB: [birth year]   PCP: Corlis Pagan, NP  Patient is from: Home.  Lives with family.  Ambulates holding to furnitures lately  DOA: 03/21/2024 LOS: 1  Chief complaints Fever    Brief Narrative / Interim history: 89 year old M with PMH of CAD/CABG and VF arrest in 2003, combined CHF, VT on amiodarone , CKD-3A, HTN, HLD, recent diagnosis of colon cancer and iron  deficiency anemia sent to ED from oncology office due to fever to 101 F.  Patient and family reports progressive dyspnea, fatigue and DOE for about 2 days.  Also about 10 pound weight loss since Thanksgiving.  No report of melena or hematochezia but patient and family not quite sure.  He is on low-dose aspirin .  Denies NSAID use.  It seems like patient had recent EGD and colonoscopy with [facility].  There was some concern about colonic mass for which she was referred to oncology.  He was also scheduled for CT abdomen and pelvis with contrast on 1/12.  In ED, stable vitals.  Na 129. Cr 1.28.  Hgb 9.8 (was 9.9 on 12/18).  proBNP 1667 (normal for age).  Troponin 38 and 35.  Influenza A positive.  2V CXR without acute finding.  Maxillofacial CT suggested frontal, maxillary and ethmoid sinus disease and complete occlusion of the ostiomeatal complexes bilaterally.  CT angio chest negative for PE but suggested bronchiectasis in LLL with bronchial wall thickening bilaterally.  Blood culture and TTE ordered.  Received 500 cc LR bolus, IV Lasix  and started on Tamiflu .  Admitted with acute respiratory failure with hypoxia due to working diagnosis of acute CHF and influenza A infection, and hyponatremia.    TTE without significant finding.   Subjective: Seen and examined earlier this morning.  No major events overnight or this morning. Liberated of oxygen.  No complaints other than nasal congestion and some dry cough.  Patient's wife, son and daughter-in-law at bedside.  Patient denies chest pain, orthopnea,  edema, GI or UTI symptoms.  Per family, patient had recent EGD and colonoscopy at [facility] for GI bleed.  Patient and family unsure about melena or hematochezia.   Assessment and plan: Acute respiratory failure with hypoxia: Reportedly hypoxic to 88% on RA requiring 2 L on admission.  Likely due to influenza A infection.  Respiratory failure seems to have resolved.  Currently saturating in mid 90s on RA at rest.  Appears euvolemic on exam.  proBNP within normal for age.  No signs of pulmonary edema or congestion on CXR.  CT angio chest suggested LLL bronchiectasis with bronchial wall thickening bilaterally.  TTE reassuring. -Continue Tamiflu  for influenza A infection. -Nebulizers as needed   Influenza A infection: CT angio chest as above. -Continue Tamiflu  and as needed albuterol .  Chronic sinusitis: Has nasal congestion.  Also reports history of seasonal allergies.  He uses chlorpheniramine at home.  Doubt bacterial sinusitis at this point. - Advised to use newer antihistamine such as Claritin  - Schedule saline nose spray every 8 hours and Flonase  nasal spray daily    Chronic HFimpEF: TTE with LVEF of 55 to 60% (was 45 to 50% in 04/2023) and normal DD.  Appears euvolemic on exam.  proBNP within normal.  CXR without pulmonary edema or vascular congestion.  Received IV Lasix  20 mg x 1 in ED.  Not on diuretics at home but takes chlorthalidone  for blood pressure. -Monitor off IV fluid -Intake and output, daily weights  Please see of CAD s/p  CABG 2003: No anginal symptoms.  Slightly elevated troponin without significant delta likely demand ischemia.  TTE reassuring. -Continue medical management   Hypercholesterolemia -Continue zocor  20mg  daily    NSVT/history of VF arrest -Continue low-dose amiodarone   Hypovolemic hyponatremia: Mild.  Likely due to poor p.o. intake and chlorthalidone .  Improved. -Continue holding chlorthalidone  -Continue monitoring   Iron  deficiency anemia: No report of  melena or hematochezia but family not sure.  Had recent EGD and colonoscopy at [facility].  It seems like his recent colonoscopy showed colon mass for which she was referred to oncology.  I have no access to these results.  Takes low-dose aspirin  at home.  Anemia panel with iron  deficiency. Recent Labs    02/27/24 0831 03/21/24 1433 03/22/24 0528  HGB 9.9* 9.8* 8.4*  - IV Venofer  300 mg x 1 - Requested on call [facility] to look into his EGD and colonoscopy results -Continue p.o. Protonix . - Monitor H&H.   Colonic mass: Colonoscopy 03/18/2024 with colonic mass concerning for malignancy.  It seems like he had a CT abdomen and pelvis scheduled for 1/12 and an oncology appointment on 1/13. -I have requested on-call [facility] to look into EGD and colonoscopy results and pathology results -Added oncology, Dr. Lanny to treatment team   Essential hypertension: Normotensive for most part. -Continue diovan  160mg  daily -Hold chlorthalidone  with hyponatremia    CKD-3A: Baseline creatinine 1.2-1.3.  Stable. -Continue monitoring - Continue holding chlorthalidone .  Generalized weakness: Progressive fatigue, dyspnea and weight loss.  Could be due to anemia and malignancy. - Management as above - PT/OT eval  Hypokalemia: Likely due to chlorthalidone . -Monitor replenish K and Mg as appropriate  Elevated liver enzymes: Metastasis?  Mild. - Continue monitoring - CT abdomen and pelvis as above.     Body mass index is 25.59 kg/m.           DVT prophylaxis:  enoxaparin  (LOVENOX ) injection 40 mg Start: 03/21/24 2200  Code Status: Full code Family Communication: Updated patient's wife, son and daughter-in-law at bedside Level of care: Progressive Status is: Inpatient Remains inpatient appropriate because: Respiratory failure, generalized weakness and iron  deficiency anemia   Final disposition: To be determined   55 minutes with more than 50% spent in reviewing records, counseling  patient/family and coordinating care.  Consultants:  Oncology  Procedures: None  Microbiology summarized: Blood cultures NGTD  Objective: Vitals:   03/21/24 2149 03/22/24 0045 03/22/24 0459 03/22/24 1416  BP: (!) 153/68 119/61 128/64 (!) 134/56  Pulse: 75 67 76 67  Resp: 20 20 20    Temp: 99.1 F (37.3 C) 98.6 F (37 C) 98 F (36.7 C) 97.7 F (36.5 C)  TempSrc: Oral Oral Oral Oral  SpO2: 96% 94% 96% 94%  Weight:   80.9 kg   Height:        Examination:  GENERAL: No apparent distress.  Nontoxic. HEENT: MMM.  Vision and hearing grossly intact.  Nasal congestion. NECK: Supple.  No apparent JVD.  RESP:  No IWOB.  Fair aeration bilaterally. CVS:  RRR. Heart sounds normal.  ABD/GI/GU: BS+. Abd soft, NTND.  MSK/EXT:  Moves extremities. No apparent deformity. No edema.  SKIN: no apparent skin lesion or wound NEURO: AA.  Oriented appropriately.  No apparent focal neuro deficit. PSYCH: Calm. Normal affect.   Sch Meds:  Scheduled Meds:  amiodarone   100 mg Oral Daily   aspirin  EC  81 mg Oral Daily   enoxaparin  (LOVENOX ) injection  40 mg Subcutaneous Q24H  FLUoxetine   10 mg Oral QHS   irbesartan   150 mg Oral Daily   oseltamivir   30 mg Oral BID   pantoprazole   40 mg Oral Daily   simvastatin   20 mg Oral q1800   tamsulosin   0.4 mg Oral QPC supper   Continuous Infusions:  iron  sucrose     PRN Meds:.acetaminophen  **OR** acetaminophen , albuterol , azelastine , fluticasone   Antimicrobials: Anti-infectives (From admission, onward)    Start     Dose/Rate Route Frequency Ordered Stop   03/22/24 1000  oseltamivir  (TAMIFLU ) capsule 30 mg       Placed in Followed by Linked Group   30 mg Oral 2 times daily 03/21/24 1750 03/26/24 2159   03/21/24 1800  oseltamivir  (TAMIFLU ) capsule 75 mg       Placed in Followed by Linked Group   75 mg Oral  Once 03/21/24 1750 03/21/24 2020        I have personally reviewed the following labs and images: CBC: Recent Labs  Lab  03/21/24 1433 03/22/24 0528  WBC 5.4 5.0  NEUTROABS 3.5  --   HGB 9.8* 8.4*  HCT 30.3* 25.2*  MCV 88.9 86.0  PLT 423* 380   BMP &GFR Recent Labs  Lab 03/21/24 1433 03/21/24 1912 03/22/24 0528  NA 129*  --  132*  K 3.6  --  3.4*  CL 90*  --  92*  CO2 30  --  30  GLUCOSE 109*  --  109*  BUN 21  --  20  CREATININE 1.28*  --  1.09  CALCIUM 9.0  --  8.8*  MG  --  1.8  --    Estimated Creatinine Clearance: 48.4 mL/min (by C-G formula based on SCr of 1.09 mg/dL). Liver & Pancreas: Recent Labs  Lab 03/21/24 1912 03/22/24 0528  AST 71* 62*  ALT 73* 66*  ALKPHOS 67 76  BILITOT 0.3 0.3  PROT 5.7* 6.4*  ALBUMIN 2.5* 2.8*   No results for input(s): LIPASE, AMYLASE in the last 168 hours. No results for input(s): AMMONIA in the last 168 hours. Diabetic: No results for input(s): HGBA1C in the last 72 hours. No results for input(s): GLUCAP in the last 168 hours. Cardiac Enzymes: No results for input(s): CKTOTAL, CKMB, CKMBINDEX, TROPONINI in the last 168 hours. Recent Labs    02/27/24 0826 03/21/24 1433  PROBNP 621.0* 1,667.0*   Coagulation Profile: No results for input(s): INR, PROTIME in the last 168 hours. Thyroid  Function Tests: No results for input(s): TSH, T4TOTAL, FREET4, T3FREE, THYROIDAB in the last 72 hours. Lipid Profile: No results for input(s): CHOL, HDL, LDLCALC, TRIG, CHOLHDL, LDLDIRECT in the last 72 hours. Anemia Panel: Recent Labs    03/22/24 0528  VITAMINB12 1,158*  FOLATE 12.2  FERRITIN 249  TIBC 214*  IRON  15*  RETICCTPCT 2.6   Urine analysis:    Component Value Date/Time   COLORURINE YELLOW 02/27/2024 0920   APPEARANCEUR CLEAR 02/27/2024 0920   LABSPEC 1.029 02/27/2024 0920   PHURINE 5.5 02/27/2024 0920   GLUCOSEU NEGATIVE 02/27/2024 0920   HGBUR NEGATIVE 02/27/2024 0920   BILIRUBINUR NEGATIVE 02/27/2024 0920   KETONESUR TRACE (A) 02/27/2024 0920   PROTEINUR 30 (A) 02/27/2024 0920    NITRITE NEGATIVE 02/27/2024 0920   LEUKOCYTESUR NEGATIVE 02/27/2024 0920   Sepsis Labs: Invalid input(s): PROCALCITONIN, LACTICIDVEN  Microbiology: Recent Results (from the past 240 hours)  Blood culture (routine x 2)     Status: None (Preliminary result)   Collection Time: 03/21/24  2:15 PM  Specimen: Left Antecubital; Blood  Result Value Ref Range Status   Specimen Description   Final    LEFT ANTECUBITAL BOTTLES DRAWN AEROBIC AND ANAEROBIC Performed at [facility], [address] [place], [name] [address]    Special Requests   Final    Blood Culture results may not be optimal due to an inadequate volume of blood received in culture bottles Performed at [facility], [address], [place], [name] [address]    Culture   Final    NO GROWTH < 24 HOURS Performed at [facility], [address] [place], [name] [address]    Report Status PENDING  Incomplete  Blood culture (routine x 2)     Status: None (Preliminary result)   Collection Time: 03/21/24  2:20 PM   Specimen: BLOOD LEFT FOREARM  Result Value Ref Range Status   Specimen Description   Final    BLOOD LEFT FOREARM BOTTLES DRAWN AEROBIC AND ANAEROBIC Performed at [facility], [address], [place], [name] [address]    Special Requests   Final    Blood Culture results may not be optimal due to an inadequate volume of blood received in culture bottles Performed at [facility], [address] [place], [name] [address]    Culture   Final    NO GROWTH < 24 HOURS Performed at [facility], [address] [place], [name] [address]    Report Status PENDING  Incomplete  Resp panel by RT-PCR (RSV, Flu A&B, Covid) Anterior Nasal Swab     Status: Abnormal   Collection Time: 03/21/24  2:43 PM   Specimen: Anterior Nasal Swab  Result Value Ref Range Status   SARS Coronavirus 2 by RT PCR NEGATIVE NEGATIVE Final    Comment:  (NOTE) SARS-CoV-2 target nucleic acids are NOT DETECTED.  The SARS-CoV-2 RNA is generally detectable in upper respiratory specimens during the acute phase of infection. The lowest concentration of SARS-CoV-2 viral copies this assay can detect is 138 copies/mL. A negative result does not preclude SARS-Cov-2 infection and should not be used as the sole basis for treatment or other patient management decisions. A negative result may occur with  improper specimen collection/handling, submission of specimen other than nasopharyngeal swab, presence of viral mutation(s) within the areas targeted by this assay, and inadequate number of viral copies(<138 copies/mL). A negative result must be combined with clinical observations, patient history, and epidemiological information. The expected result is Negative.  Fact Sheet for Patients:  bloggercourse.com  Fact Sheet for Healthcare Providers:  seriousbroker.it  This test is no t yet approved or cleared by the [place]  FDA and  has been authorized for detection and/or diagnosis of SARS-CoV-2 by FDA under an Emergency Use Authorization (EUA). This EUA will remain  in effect (meaning this test can be used) for the duration of the COVID-19 declaration under Section 564(b)(1) of the Act, 21 U.S.C.section 360bbb-3(b)(1), unless the authorization is terminated  or revoked sooner.       Influenza A by PCR POSITIVE (A) NEGATIVE Final   Influenza B by PCR NEGATIVE NEGATIVE Final    Comment: (NOTE) The Xpert Xpress SARS-CoV-2/FLU/RSV plus assay is intended as an aid in the diagnosis of influenza from Nasopharyngeal swab specimens and should not be used as a sole basis for treatment. Nasal washings and aspirates are unacceptable for Xpert Xpress SARS-CoV-2/FLU/RSV testing.  Fact Sheet for Patients: bloggercourse.com  Fact Sheet for Healthcare  Providers: seriousbroker.it  This test is not yet approved or cleared by the [place]  FDA and has been authorized for detection and/or diagnosis of SARS-CoV-2 by FDA under an Emergency Use Authorization (EUA). This EUA will remain in effect (meaning this test can be used) for the duration of the COVID-19 declaration under Section 564(b)(1) of the Act, 21 U.S.C. section 360bbb-3(b)(1), unless the authorization is terminated or revoked.     Resp Syncytial Virus by PCR NEGATIVE NEGATIVE Final    Comment: (NOTE) Fact Sheet for Patients: bloggercourse.com  Fact Sheet for Healthcare Providers: seriousbroker.it  This test is not yet approved or cleared by the [place]  FDA and has been authorized for detection and/or diagnosis of SARS-CoV-2 by FDA under an Emergency Use Authorization (EUA). This EUA will remain in effect (meaning this test can be used) for the duration of the COVID-19 declaration under Section 564(b)(1) of the Act, 21 U.S.C. section 360bbb-3(b)(1), unless the authorization is terminated or revoked.  Performed at [facility], [address] [place], [name] [address]     Radiology Studies: ECHOCARDIOGRAM COMPLETE Result Date: 03/22/2024    ECHOCARDIOGRAM REPORT   Patient Name:   Bradrick D Poland Date of Exam: 03/22/2024 Medical Rec #:  [id number]     Height:       70.0 in Accession #:    [id number]    Weight:       178.4 lb Date of Birth:  1933/10/24      BSA:          1.988 m Patient Age:    89 years      BP:           128/64 mmHg Patient Gender: M             HR:           79 bpm. Exam Location:  Inpatient Procedure: 2D Echo (Both Spectral and Color Flow Doppler were utilized during            procedure). Indications:    acute systolic chf  History:        Patient has prior history of Echocardiogram examinations, most                 recent 04/24/2023. CAD, Prior CABG,  chronic kidney disease; Risk                 Factors:Hypertension and Dyslipidemia.  Sonographer:    Tinnie Barefoot RDCS Referring Phys: [id number] [facility]  Sonographer Comments: Suboptimal subcostal window. IMPRESSIONS  1. Left ventricular ejection fraction, by estimation, is 55 to 60%. The left ventricle has normal function. The left ventricle has no regional wall motion abnormalities. Left ventricular diastolic parameters were normal.  2. Right ventricular systolic function is normal. The right ventricular size is normal.  3. The mitral valve is normal in structure. No evidence of mitral valve regurgitation. No evidence of mitral stenosis.  4. The aortic valve is normal in structure. Aortic valve regurgitation is not visualized. No aortic stenosis is present.  5. The inferior vena cava is normal in size with <50% respiratory variability, suggesting right atrial pressure of 8 mmHg. Comparison(s): Prior images reviewed side by side. EF has recovered. FINDINGS  Left Ventricle: Left ventricular ejection fraction, by estimation, is 55 to 60%. The left ventricle has normal function. The left ventricle has no regional wall motion abnormalities. The left ventricular internal cavity size was normal in size. There is  no left  ventricular hypertrophy. Left ventricular diastolic parameters were normal. Right Ventricle: The right ventricular size is normal. No increase in right ventricular wall thickness. Right ventricular systolic function is normal. Left Atrium: Left atrial size was normal in size. Right Atrium: Right atrial size was normal in size. Pericardium: There is no evidence of pericardial effusion. Mitral Valve: The mitral valve is normal in structure. No evidence of mitral valve regurgitation. No evidence of mitral valve stenosis. Tricuspid Valve: The tricuspid valve is normal in structure. Tricuspid valve regurgitation is not demonstrated. No evidence of tricuspid stenosis. Aortic Valve: The aortic valve is  normal in structure. Aortic valve regurgitation is not visualized. No aortic stenosis is present. Pulmonic Valve: The pulmonic valve was normal in structure. Pulmonic valve regurgitation is trivial. No evidence of pulmonic stenosis. Aorta: The aortic root is normal in size and structure. Venous: The inferior vena cava is normal in size with less than 50% respiratory variability, suggesting right atrial pressure of 8 mmHg. IAS/Shunts: The interatrial septum was not well visualized.  LEFT VENTRICLE PLAX 2D LVIDd:         4.60 cm     Diastology LVIDs:         3.00 cm     LV e' medial:    7.18 cm/s LV PW:         1.20 cm     LV E/e' medial:  11.7 LV IVS:        1.10 cm     LV e' lateral:   8.27 cm/s LVOT diam:     2.10 cm     LV E/e' lateral: 10.2 LV SV:         69 LV SV Index:   34 LVOT Area:     3.46 cm  LV Volumes (MOD) LV vol d, MOD A2C: 79.2 ml LV vol d, MOD A4C: 70.6 ml LV vol s, MOD A2C: 38.2 ml LV vol s, MOD A4C: 37.8 ml LV SV MOD A2C:     41.0 ml LV SV MOD A4C:     70.6 ml LV SV MOD BP:      35.6 ml RIGHT VENTRICLE             IVC RV Basal diam:  2.50 cm     IVC diam: 2.00 cm RV S prime:     14.50 cm/s TAPSE (M-mode): 1.7 cm      PULMONARY VEINS                             Diastolic Velocity: 37.70 cm/s                             S/D Velocity:       1.20                             Systolic Velocity:  43.40 cm/s LEFT ATRIUM             Index        RIGHT ATRIUM           Index LA diam:        3.30 cm 1.66 cm/m   RA Area:     12.70 cm LA Vol (A2C):   60.3 ml 30.33 ml/m  RA Volume:   29.50 ml  14.84 ml/m LA Vol (A4C):  47.9 ml 24.09 ml/m LA Biplane Vol: 55.6 ml 27.97 ml/m  AORTIC VALVE LVOT Vmax:   109.00 cm/s LVOT Vmean:  71.900 cm/s LVOT VTI:    0.198 m  AORTA Ao Root diam: 3.50 cm Ao Asc diam:  3.00 cm MITRAL VALVE MV Area (PHT): 2.83 cm     SHUNTS MV Decel Time: 268 msec     Systemic VTI:  0.20 m MV E velocity: 84.10 cm/s   Systemic Diam: 2.10 cm MV A velocity: 115.00 cm/s MV E/A ratio:  0.73 Franck  Azobou Tonleu Electronically signed by Joelle Ren Ny Signature Date/Time: 03/22/2024/10:43:44 AM    Final    CT Maxillofacial Wo Contrast Result Date: 03/21/2024 CLINICAL DATA:  Sinus pain EXAM: CT MAXILLOFACIAL WITHOUT CONTRAST TECHNIQUE: Multidetector CT imaging of the maxillofacial structures was performed. Multiplanar CT image reconstructions were also generated. RADIATION DOSE REDUCTION: This exam was performed according to the departmental dose-optimization program which includes automated exposure control, adjustment of the mA and/or kV according to patient size and/or use of iterative reconstruction technique. COMPARISON:  02/27/2024 FINDINGS: Osseous: No fracture or mandibular dislocation. No destructive process. Orbits: Negative. No traumatic or inflammatory finding. Sinuses: There is complete opacification of the frontal and maxillary sinuses. Opacification of the anterior ethmoid air cells. Sphenoid sinus is patent. There is occlusion of the ostiomeatal complexes bilaterally. Soft tissues: Negative. Limited intracranial: No significant or unexpected finding. IMPRESSION: 1. Frontal, maxillary, and ethmoid sinus disease as above. Complete occlusion of the ostiomeatal complexes bilaterally. Electronically Signed   By: Ozell Daring M.D.   On: 03/21/2024 16:55   CT Angio Chest PE W and/or Wo Contrast Result Date: 03/21/2024 CLINICAL DATA:  Pulmonary embolism suspected, high probability. EXAM: CT ANGIOGRAPHY CHEST WITH CONTRAST TECHNIQUE: Multidetector CT imaging of the chest was performed using the standard protocol during bolus administration of intravenous contrast. Multiplanar CT image reconstructions and MIPs were obtained to evaluate the vascular anatomy. RADIATION DOSE REDUCTION: This exam was performed according to the departmental dose-optimization program which includes automated exposure control, adjustment of the mA and/or kV according to patient size and/or use of iterative  reconstruction technique. CONTRAST:  75mL OMNIPAQUE  IOHEXOL  350 MG/ML SOLN COMPARISON:  None Available. FINDINGS: Cardiovascular: The heart is normal in size and there is no significant pericardial effusion. Three-vessel coronary artery calcifications are noted. There is atherosclerotic calcification of the aorta without evidence of aneurysm. The pulmonary trunk is normal in caliber. Evidence of pulmonary embolism is seen. Mediastinum/Nodes: No mediastinal, hilar, or axillary lymphadenopathy is seen. The thyroid  gland, trachea, and esophagus are within normal limits. There is a small hiatal hernia. Lungs/Pleura: Bronchial wall thickening is present bilaterally. Atelectasis is noted at the lung bases bilaterally. No effusion or pneumothorax is seen. Bronchiectasis is present in the left lower lobe. Upper Abdomen: No acute abnormality. Musculoskeletal: Sternotomy wires are noted. There are degenerative changes in the thoracic spine. No acute osseous abnormality is seen. Loop recorder device is present in the anterior chest wall on the left. Review of the MIP images confirms the above findings. IMPRESSION: 1. No evidence of pulmonary embolism. 2. Bronchiectasis in the left lower lobe with bronchial wall thickening bilaterally, which may be infectious or inflammatory. 3. Small hiatal hernia. 4. Coronary artery calcifications. 5. Aortic atherosclerosis. Electronically Signed   By: Leita Birmingham M.D.   On: 03/21/2024 16:47      Jeison Delpilar T. Larone Kliethermes [facility]  If 7PM-7AM, please contact night-coverage [email or web address] 03/22/2024, 2:24 PM   "

## 2024-03-23 ENCOUNTER — Encounter (HOSPITAL_COMMUNITY): Payer: Self-pay | Admitting: Family Medicine

## 2024-03-23 ENCOUNTER — Other Ambulatory Visit

## 2024-03-23 ENCOUNTER — Observation Stay (HOSPITAL_COMMUNITY)

## 2024-03-23 DIAGNOSIS — J101 Influenza due to other identified influenza virus with other respiratory manifestations: Secondary | ICD-10-CM | POA: Diagnosis not present

## 2024-03-23 DIAGNOSIS — R627 Adult failure to thrive: Secondary | ICD-10-CM | POA: Diagnosis not present

## 2024-03-23 DIAGNOSIS — J9601 Acute respiratory failure with hypoxia: Secondary | ICD-10-CM | POA: Diagnosis not present

## 2024-03-23 DIAGNOSIS — E440 Moderate protein-calorie malnutrition: Secondary | ICD-10-CM | POA: Insufficient documentation

## 2024-03-23 DIAGNOSIS — J329 Chronic sinusitis, unspecified: Secondary | ICD-10-CM | POA: Diagnosis not present

## 2024-03-23 LAB — COMPREHENSIVE METABOLIC PANEL WITH GFR
ALT: 55 U/L — ABNORMAL HIGH (ref 0–44)
AST: 51 U/L — ABNORMAL HIGH (ref 15–41)
Albumin: 2.6 g/dL — ABNORMAL LOW (ref 3.5–5.0)
Alkaline Phosphatase: 62 U/L (ref 38–126)
Anion gap: 9 (ref 5–15)
BUN: 16 mg/dL (ref 8–23)
CO2: 29 mmol/L (ref 22–32)
Calcium: 8.8 mg/dL — ABNORMAL LOW (ref 8.9–10.3)
Chloride: 94 mmol/L — ABNORMAL LOW (ref 98–111)
Creatinine, Ser: 1 mg/dL (ref 0.61–1.24)
GFR, Estimated: >60 mL/min
Glucose, Bld: 103 mg/dL — ABNORMAL HIGH (ref 70–99)
Potassium: 3.3 mmol/L — ABNORMAL LOW (ref 3.5–5.1)
Sodium: 133 mmol/L — ABNORMAL LOW (ref 135–145)
Total Bilirubin: 0.3 mg/dL (ref 0.0–1.2)
Total Protein: 5.8 g/dL — ABNORMAL LOW (ref 6.5–8.1)

## 2024-03-23 LAB — CBC
HCT: 23.9 % — ABNORMAL LOW (ref 39.0–52.0)
Hemoglobin: 8 g/dL — ABNORMAL LOW (ref 13.0–17.0)
MCH: 28.8 pg (ref 26.0–34.0)
MCHC: 33.5 g/dL (ref 30.0–36.0)
MCV: 86 fL (ref 80.0–100.0)
Platelets: 342 K/uL (ref 150–400)
RBC: 2.78 MIL/uL — ABNORMAL LOW (ref 4.22–5.81)
RDW: 14.4 % (ref 11.5–15.5)
WBC: 4.8 K/uL (ref 4.0–10.5)
nRBC: 0 % (ref 0.0–0.2)

## 2024-03-23 LAB — MAGNESIUM: Magnesium: 2 mg/dL (ref 1.7–2.4)

## 2024-03-23 MED ORDER — LORATADINE 10 MG PO TABS
10.0000 mg | ORAL_TABLET | Freq: Every day | ORAL | Status: DC
  Administered 2024-03-23 – 2024-03-24 (×2): 10 mg via ORAL
  Filled 2024-03-23 (×2): qty 1

## 2024-03-23 MED ORDER — IOHEXOL 9 MG/ML PO SOLN
500.0000 mL | ORAL | Status: AC
  Administered 2024-03-23: 500 mL via ORAL
  Administered 2024-03-23: 1000 mL via ORAL

## 2024-03-23 MED ORDER — ADULT MULTIVITAMIN W/MINERALS CH
1.0000 | ORAL_TABLET | Freq: Every day | ORAL | Status: DC
  Administered 2024-03-23 – 2024-03-24 (×2): 1 via ORAL
  Filled 2024-03-23 (×2): qty 1

## 2024-03-23 MED ORDER — POTASSIUM CHLORIDE CRYS ER 20 MEQ PO TBCR
40.0000 meq | EXTENDED_RELEASE_TABLET | ORAL | Status: AC
  Administered 2024-03-23 (×2): 40 meq via ORAL
  Filled 2024-03-23: qty 2

## 2024-03-23 MED ORDER — POTASSIUM CHLORIDE CRYS ER 20 MEQ PO TBCR
40.0000 meq | EXTENDED_RELEASE_TABLET | Freq: Once | ORAL | Status: AC
  Filled 2024-03-23: qty 2

## 2024-03-23 MED ORDER — IOHEXOL 300 MG/ML  SOLN
100.0000 mL | Freq: Once | INTRAMUSCULAR | Status: AC | PRN
  Administered 2024-03-23: 100 mL via INTRAVENOUS

## 2024-03-23 NOTE — Plan of Care (Signed)
   Problem: Education: Goal: Knowledge of General Education information will improve Description: Including pain rating scale, medication(s)/side effects and non-pharmacologic comfort measures Outcome: Progressing   Problem: Clinical Measurements: Goal: Ability to maintain clinical measurements within normal limits will improve Outcome: Progressing Goal: Will remain free from infection Outcome: Progressing

## 2024-03-23 NOTE — Progress Notes (Signed)
 Initial Nutrition Assessment  DOCUMENTATION CODES:   Non-severe (moderate) malnutrition in context of acute illness/injury  INTERVENTION:   -Magic cup TID with meals, each supplement provides 290 kcal and 9 grams of protein   -Placed on meal order with assist to ensure pt is ordering meals  -Multivitamin with minerals daily  NUTRITION DIAGNOSIS:   Moderate Malnutrition related to acute illness as evidenced by mild fat depletion, mild muscle depletion, energy intake < or equal to 75% for > or equal to 1 month.  GOAL:   Patient will meet greater than or equal to 90% of their needs  MONITOR:   PO intake, Supplement acceptance  REASON FOR ASSESSMENT:   Consult Assessment of nutrition requirement/status, Poor PO  ASSESSMENT:   89 year old M with PMH of CAD/CABG and VF arrest in 2003, combined CHF, VT on amiodarone , CKD-3A, HTN, HLD, recent diagnosis of colon cancer and iron  deficiency anemia sent to ED from oncology office due to fever to 101 F.  Patient and family reports progressive dyspnea, fatigue and DOE for about 2 days.  Also about 10 pound weight loss since Thanksgiving.  Patient in room, no family at bedside. Pt reports appetite is better today, looking forward to his sandwich for lunch. Pt states he ate breakfast but upon checking meal ordering system no breakfast was provided this morning. Pt reports at home he tends to eat eggs, toast, grits for breakfast, may eat a sandwich for lunch and no dinner. Reports in general not a good appetite. Does not use any supplements or protein shakes. Not interested here in Ensure. Will add Magic cups to meals and daily MVI. Will place on meal order with assist to ensure pt is ordering meals.  Denies any swallowing ,chewing or taste issues.  Pt reports UBW ~180 lbs. Per weight records, pt has lost 8 lbs since 10/1 (4% wt loss x 3.5 months, insignificant for time frame).  Medications: KLOR-CON , iron  sucrose  Labs reviewed: Low  sodium  Low potassium Low iron  Low Vitamin D  Flu +   NUTRITION - FOCUSED PHYSICAL EXAM:  Flowsheet Row Most Recent Value  Orbital Region Mild depletion  Upper Arm Region Moderate depletion  Thoracic and Lumbar Region Mild depletion  Buccal Region Mild depletion  Temple Region Mild depletion  Clavicle Bone Region Mild depletion  Clavicle and Acromion Bone Region Mild depletion  Scapular Bone Region Mild depletion  Dorsal Hand Mild depletion  Patellar Region Mild depletion  Anterior Thigh Region Mild depletion  Posterior Calf Region Mild depletion  Edema (RD Assessment) None  Hair Reviewed  Eyes Reviewed  Mouth Reviewed  Skin Reviewed  Nails Reviewed    Diet Order:   Diet Order             Diet Heart Room service appropriate? Yes; Fluid consistency: Thin; Fluid restriction: 1800 mL Fluid  Diet effective now                   EDUCATION NEEDS:   No education needs have been identified at this time  Skin:  Skin Assessment: Reviewed RN Assessment  Last BM:  PTA  Height:   Ht Readings from Last 1 Encounters:  03/21/24 5' 10 (1.778 m)    Weight:   Wt Readings from Last 1 Encounters:  03/23/24 78.6 kg    BMI:  Body mass index is 24.85 kg/m.  Estimated Nutritional Needs:   Kcal:  1900-2100  Protein:  85-95g  Fluid:  2L/day   Morna Lee, MS,  RD, LDN Inpatient Clinical Dietitian Contact via Secure chat

## 2024-03-23 NOTE — Progress Notes (Signed)
 " PROGRESS NOTE  Michael Bryant FMW:[id number] DOB: 06/03/1933   PCP: Corlis Pagan, NP  Patient is from: Home.  Lives with family.  Ambulates holding to furnitures lately  DOA: 03/21/2024 LOS: 1  Chief complaints Fever    Brief Narrative / Interim history: 89 year old M with PMH of CAD/CABG and VF arrest in 2003, combined CHF, VT on amiodarone , CKD-3A, HTN, HLD, recent diagnosis of colon cancer and iron  deficiency anemia sent to ED from oncology office due to fever to 101 F.  Patient and family reports progressive dyspnea, fatigue and DOE for about 2 days.  Also about 10 pound weight loss since Thanksgiving.  No report of melena or hematochezia but patient and family not quite sure.  He is on low-dose aspirin .  Denies NSAID use.  It seems like patient had recent EGD and colonoscopy with [facility].  There was some concern about colonic mass for which she was referred to oncology.  He was also scheduled for CT abdomen and pelvis with contrast on 1/12.  In ED, stable vitals.  Na 129. Cr 1.28.  Hgb 9.8 (was 9.9 on 12/18).  proBNP 1667 (normal for age).  Troponin 38 and 35.  Influenza A positive.  2V CXR without acute finding.  Maxillofacial CT suggested frontal, maxillary and ethmoid sinus disease and complete occlusion of the ostiomeatal complexes bilaterally.  CT angio chest negative for PE but suggested bronchiectasis in LLL with bronchial wall thickening bilaterally.  Blood culture and TTE ordered.  Received 500 cc LR bolus, IV Lasix  and started on Tamiflu .  Admitted with acute respiratory failure with hypoxia due to working diagnosis of acute CHF and influenza A infection, and hyponatremia.    TTE without significant finding.  CT abdomen and pelvis ordered as recommended by GI to assess colonic mass.  Oncology added to treatment team.   Subjective: Seen and examined earlier this morning.  No major events overnight or this morning.  Sitting on bedside chair.  No complaints.  Reports feeling better  from breathing standpoint.  Denies chest pain.  Still with nasal congestion.  Patient's wife at bedside  Assessment and plan: Acute respiratory failure with hypoxia: Reportedly hypoxic to 88% on RA requiring 2 L on admission.  Likely due to influenza A infection.  Respiratory failure seems to have resolved.  Currently saturating in mid 90s on RA at rest.  Appears euvolemic on exam.  proBNP within normal for age.  No signs of pulmonary edema or congestion on CXR.  CT angio chest suggested LLL bronchiectasis with bronchial wall thickening bilaterally.  TTE reassuring. -Continue Tamiflu  for influenza A infection. -Nebulizers as needed -Incentive spirometry, OOB, ambulatory saturation, PT/OT eval.   Influenza A infection: CT angio chest as above. -Continue Tamiflu  and as needed albuterol .  Chronic sinusitis: Has nasal congestion.  Also reports history of seasonal allergies.  He uses chlorpheniramine at home.  Doubt bacterial sinusitis at this point. - Advised to use newer antihistamine such as Claritin  - Schedule saline nose spray every 8 hours and Flonase  nasal spray daily    Chronic HFimpEF: TTE with LVEF of 55 to 60% (was 45 to 50% in 04/2023) and normal DD.  Appears euvolemic on exam.  proBNP within normal.  CXR without pulmonary edema or vascular congestion.  Received IV Lasix  20 mg x 1 in ED.  Not on diuretics at home but takes chlorthalidone  for blood pressure. -Intake and output, daily weights  Please see of CAD s/p CABG 2003: No anginal symptoms.  Slightly elevated troponin without significant delta likely demand ischemia.  TTE reassuring. -Continue medical management   Hypercholesterolemia -Continue zocor  20mg  daily    NSVT/history of VF arrest -Continue low-dose amiodarone   Hypovolemic hyponatremia: Mild.  Likely due to poor p.o. intake and chlorthalidone .  Improved. -Continue holding chlorthalidone  -Continue monitoring   Iron  deficiency anemia: No report of melena or  hematochezia.  Per [facility], recent EGD showed gastritis and colonoscopy showed colonic mass.  Pathology from ascending colon mass pending.  Takes low-dose aspirin  at home.  Anemia panel with iron  deficiency. Recent Labs    02/27/24 0831 03/21/24 1433 03/22/24 0528 03/23/24 0502  HGB 9.9* 9.8* 8.4* 8.0*  - Received IV Venofer  300 mg on 1/11.  Ambulatory referral ordered. -Continue p.o. Protonix . -Monitor H&H.  Monitor for Hgb <8.0 given history of CAD.   Colonic mass: Per [facility], colonoscopy 03/18/2024 with ascending colon mass. -GI recommended CT abdomen and pelvis while in house. -He was scheduled to see Dr. Lanny on 1/13.  Added Dr. Lanny to treatment team. -Will ask [facility] to follow-up on pathology from colon mass   Essential hypertension: Normotensive for most part. -Continue diovan  160mg  daily -Continue holding chlorthalidone  with hyponatremia    CKD-3A: b/l Cr 1.2-1.3?  Improved. -Continue monitoring -Continue holding chlorthalidone .  Generalized weakness: Progressive fatigue, dyspnea and weight loss.  Could be due to anemia and malignancy. - Management as above - PT/OT eval  Hypokalemia: Likely due to chlorthalidone . -Monitor replenish K and Mg as appropriate  Elevated liver enzymes: Metastasis?  Mild.  Improved. - Continue monitoring - CT abdomen and pelvis as above.   Moderate malnutrition Body mass index is 24.85 kg/m. Nutrition Problem: Moderate Malnutrition Etiology: acute illness Signs/Symptoms: mild fat depletion, mild muscle depletion, energy intake < or equal to 75% for > or equal to 1 month Interventions: Magic cup, MVI   DVT prophylaxis:  enoxaparin  (LOVENOX ) injection 40 mg Start: 03/21/24 2200  Code Status: Full code Family Communication: Updated patient's wife at bedside. Level of care: Progressive Status is: Inpatient Remains inpatient appropriate because: Respiratory failure, generalized weakness and iron  deficiency anemia   Final  disposition: Home once medically stable.   55 minutes with more than 50% spent in reviewing records, counseling patient/family and coordinating care.  Consultants:  Oncology  Procedures: None  Microbiology summarized: Blood cultures NGTD  Objective: Vitals:   03/22/24 2035 03/23/24 0500 03/23/24 0536 03/23/24 1140  BP: (!) 129/56  138/60 (!) 144/67  Pulse: 64  74 68  Resp: 16  18   Temp: 99.3 F (37.4 C)  98.5 F (36.9 C)   TempSrc: Oral  Oral   SpO2: 93%  96%   Weight:  78.6 kg    Height:        Examination:  GENERAL: No apparent distress.  Nontoxic. HEENT: MMM.  Vision and hearing grossly intact.  Nasal congestion. NECK: Supple.  No apparent JVD.  RESP:  No IWOB.  Fair aeration bilaterally. CVS:  RRR. Heart sounds normal.  ABD/GI/GU: BS+. Abd soft, NTND.  MSK/EXT:  Moves extremities. No apparent deformity. No edema.  SKIN: no apparent skin lesion or wound NEURO: AA.  Oriented appropriately.  No apparent focal neuro deficit. PSYCH: Calm. Normal affect.   Sch Meds:  Scheduled Meds:  amiodarone   100 mg Oral Daily   aspirin  EC  81 mg Oral Daily   enoxaparin  (LOVENOX ) injection  40 mg Subcutaneous Q24H   FLUoxetine   10 mg Oral QHS   fluticasone   2 spray Each Nare Daily   irbesartan   150 mg Oral Daily   loratadine   10 mg Oral Daily   multivitamin with minerals  1 tablet Oral Daily   oseltamivir   30 mg Oral BID   pantoprazole   40 mg Oral Daily   potassium chloride   40 mEq Oral Q3H   simvastatin   20 mg Oral q1800   sodium chloride   2 spray Each Nare Q8H   tamsulosin   0.4 mg Oral QPC supper   Continuous Infusions:   PRN Meds:.acetaminophen  **OR** acetaminophen , albuterol , azelastine , guaiFENesin -dextromethorphan   Antimicrobials: Anti-infectives (From admission, onward)    Start     Dose/Rate Route Frequency Ordered Stop   03/22/24 1000  oseltamivir  (TAMIFLU ) capsule 30 mg       Placed in Followed by Linked Group   30 mg Oral 2 times daily 03/21/24 1750  03/26/24 2159   03/21/24 1800  oseltamivir  (TAMIFLU ) capsule 75 mg       Placed in Followed by Linked Group   75 mg Oral  Once 03/21/24 1750 03/21/24 2020        I have personally reviewed the following labs and images: CBC: Recent Labs  Lab 03/21/24 1433 03/22/24 0528 03/23/24 0502  WBC 5.4 5.0 4.8  NEUTROABS 3.5  --   --   HGB 9.8* 8.4* 8.0*  HCT 30.3* 25.2* 23.9*  MCV 88.9 86.0 86.0  PLT 423* 380 342   BMP &GFR Recent Labs  Lab 03/21/24 1433 03/21/24 1912 03/22/24 0528 03/23/24 0502  NA 129*  --  132* 133*  K 3.6  --  3.4* 3.3*  CL 90*  --  92* 94*  CO2 30  --  30 29  GLUCOSE 109*  --  109* 103*  BUN 21  --  20 16  CREATININE 1.28*  --  1.09 1.00  CALCIUM 9.0  --  8.8* 8.8*  MG  --  1.8  --  2.0   Estimated Creatinine Clearance: 52.7 mL/min (by C-G formula based on SCr of 1 mg/dL). Liver & Pancreas: Recent Labs  Lab 03/21/24 1912 03/22/24 0528 03/23/24 0502  AST 71* 62* 51*  ALT 73* 66* 55*  ALKPHOS 67 76 62  BILITOT 0.3 0.3 0.3  PROT 5.7* 6.4* 5.8*  ALBUMIN 2.5* 2.8* 2.6*   No results for input(s): LIPASE, AMYLASE in the last 168 hours. No results for input(s): AMMONIA in the last 168 hours. Diabetic: No results for input(s): HGBA1C in the last 72 hours. No results for input(s): GLUCAP in the last 168 hours. Cardiac Enzymes: No results for input(s): CKTOTAL, CKMB, CKMBINDEX, TROPONINI in the last 168 hours. Recent Labs    02/27/24 0826 03/21/24 1433  PROBNP 621.0* 1,667.0*   Coagulation Profile: No results for input(s): INR, PROTIME in the last 168 hours. Thyroid  Function Tests: No results for input(s): TSH, T4TOTAL, FREET4, T3FREE, THYROIDAB in the last 72 hours. Lipid Profile: No results for input(s): CHOL, HDL, LDLCALC, TRIG, CHOLHDL, LDLDIRECT in the last 72 hours. Anemia Panel: Recent Labs    03/22/24 0528  VITAMINB12 1,158*  FOLATE 12.2  FERRITIN 249  TIBC 214*  IRON  15*   RETICCTPCT 2.6   Urine analysis:    Component Value Date/Time   COLORURINE YELLOW 02/27/2024 0920   APPEARANCEUR CLEAR 02/27/2024 0920   LABSPEC 1.029 02/27/2024 0920   PHURINE 5.5 02/27/2024 0920   GLUCOSEU NEGATIVE 02/27/2024 0920   HGBUR NEGATIVE 02/27/2024 0920   BILIRUBINUR NEGATIVE 02/27/2024 0920   KETONESUR TRACE (A) 02/27/2024  0920   PROTEINUR 30 (A) 02/27/2024 0920   NITRITE NEGATIVE 02/27/2024 0920   LEUKOCYTESUR NEGATIVE 02/27/2024 0920   Sepsis Labs: Invalid input(s): PROCALCITONIN, LACTICIDVEN  Microbiology: Recent Results (from the past 240 hours)  Blood culture (routine x 2)     Status: None (Preliminary result)   Collection Time: 03/21/24  2:15 PM   Specimen: Left Antecubital; Blood  Result Value Ref Range Status   Specimen Description   Final    LEFT ANTECUBITAL BOTTLES DRAWN AEROBIC AND ANAEROBIC Performed at [facility], [address], [place], [name] [address]    Special Requests   Final    Blood Culture results may not be optimal due to an inadequate volume of blood received in culture bottles Performed at [facility], [address], [place], [name] [address]    Culture   Final    NO GROWTH 2 DAYS Performed at [facility], [address] [place], [name] [address]    Report Status PENDING  Incomplete  Blood culture (routine x 2)     Status: None (Preliminary result)   Collection Time: 03/21/24  2:20 PM   Specimen: BLOOD LEFT FOREARM  Result Value Ref Range Status   Specimen Description   Final    BLOOD LEFT FOREARM BOTTLES DRAWN AEROBIC AND ANAEROBIC Performed at [facility], [address], [place], [name] [address]    Special Requests   Final    Blood Culture results may not be optimal due to an inadequate volume of blood received in culture bottles Performed at [facility], [address], [place], [name] [address]    Culture   Final    NO  GROWTH 2 DAYS Performed at [facility], [address], [place], [name] [address]    Report Status PENDING  Incomplete  Resp panel by RT-PCR (RSV, Flu A&B, Covid) Anterior Nasal Swab     Status: Abnormal   Collection Time: 03/21/24  2:43 PM   Specimen: Anterior Nasal Swab  Result Value Ref Range Status   SARS Coronavirus 2 by RT PCR NEGATIVE NEGATIVE Final    Comment: (NOTE) SARS-CoV-2 target nucleic acids are NOT DETECTED.  The SARS-CoV-2 RNA is generally detectable in upper respiratory specimens during the acute phase of infection. The lowest concentration of SARS-CoV-2 viral copies this assay can detect is 138 copies/mL. A negative result does not preclude SARS-Cov-2 infection and should not be used as the sole basis for treatment or other patient management decisions. A negative result may occur with  improper specimen collection/handling, submission of specimen other than nasopharyngeal swab, presence of viral mutation(s) within the areas targeted by this assay, and inadequate number of viral copies(<138 copies/mL). A negative result must be combined with clinical observations, patient history, and epidemiological information. The expected result is Negative.  Fact Sheet for Patients:  bloggercourse.com  Fact Sheet for Healthcare Providers:  seriousbroker.it  This test is no t yet approved or cleared by the [place]  FDA and  has been authorized for detection and/or diagnosis of SARS-CoV-2 by FDA under an Emergency Use Authorization (EUA). This EUA will remain  in effect (meaning this test can be used) for the duration of the COVID-19 declaration under Section 564(b)(1) of the Act, 21 U.S.C.section 360bbb-3(b)(1), unless the authorization is terminated  or revoked sooner.       Influenza A by PCR POSITIVE (A) NEGATIVE Final   Influenza B by PCR NEGATIVE NEGATIVE  Final    Comment: (NOTE) The Xpert Xpress  SARS-CoV-2/FLU/RSV plus assay is intended as an aid in the diagnosis of influenza from Nasopharyngeal swab specimens and should not be used as a sole basis for treatment. Nasal washings and aspirates are unacceptable for Xpert Xpress SARS-CoV-2/FLU/RSV testing.  Fact Sheet for Patients: bloggercourse.com  Fact Sheet for Healthcare Providers: seriousbroker.it  This test is not yet approved or cleared by the [place]  FDA and has been authorized for detection and/or diagnosis of SARS-CoV-2 by FDA under an Emergency Use Authorization (EUA). This EUA will remain in effect (meaning this test can be used) for the duration of the COVID-19 declaration under Section 564(b)(1) of the Act, 21 U.S.C. section 360bbb-3(b)(1), unless the authorization is terminated or revoked.     Resp Syncytial Virus by PCR NEGATIVE NEGATIVE Final    Comment: (NOTE) Fact Sheet for Patients: bloggercourse.com  Fact Sheet for Healthcare Providers: seriousbroker.it  This test is not yet approved or cleared by the [place]  FDA and has been authorized for detection and/or diagnosis of SARS-CoV-2 by FDA under an Emergency Use Authorization (EUA). This EUA will remain in effect (meaning this test can be used) for the duration of the COVID-19 declaration under Section 564(b)(1) of the Act, 21 U.S.C. section 360bbb-3(b)(1), unless the authorization is terminated or revoked.  Performed at [facility], [address], [place], [name] [address]     Radiology Studies: No results found.     Carlito Bogert T. Sheniah Supak [facility]  If 7PM-7AM, please contact night-coverage [email or web address] 03/23/2024, 1:41 PM   "

## 2024-03-23 NOTE — Plan of Care (Signed)

## 2024-03-23 NOTE — Evaluation (Signed)
 Occupational Therapy Evaluation/Discharge Patient Details Name: Michael Bryant MRN: [id number] DOB: 1933-05-22 Today's Date: 03/23/2024   History of Present Illness   Pt is an 89y.o. male admitted on 03/21/24 due to complaints of weakness and fever of 101 at oncology clinic. Found to have Influenza A. PMH: CAD, CABG, HTN, HLD, VT, CHF, and colon cancer.     Clinical Impressions PTA, pt lives with wife and typically completely independent with all daily tasks without need for AD. Pt presents now at baseline for ADLs and able to manage tasks in room without physical assistance. Pt able to mobilize in room without AD, without LOB or safety concerns. Pt reports feeling much better today than on initial admission and no further skilled OT services needed at this time. Recommend pt continue OOB activity with mobility specialists while admitted.      If plan is discharge home, recommend the following:   Other (comment) (PRN)     Functional Status Assessment   Patient has not had a recent decline in their functional status     Equipment Recommendations   None recommended by OT     Recommendations for Other Services         Precautions/Restrictions   Precautions Precautions: Fall Precaution/Restrictions Comments: low fall risk Restrictions Weight Bearing Restrictions Per Provider Order: No     Mobility Bed Mobility Overal bed mobility: Modified Independent                  Transfers Overall transfer level: Independent Equipment used: None                      Balance Overall balance assessment: No apparent balance deficits (not formally assessed)                                         ADL either performed or assessed with clinical judgement   ADL Overall ADL's : At baseline                                       General ADL Comments: appears to be at baseline for ADLs. Able to mobilize in room without AD, stand  at sink for various ADLs, reports walking to bathroom with and without other staff present. no LOB and pt reports feeling good     Vision Baseline Vision/History: 1 Wears glasses Ability to See in Adequate Light: 0 Adequate Patient Visual Report: No change from baseline Vision Assessment?: No apparent visual deficits     Perception         Praxis         Pertinent Vitals/Pain Pain Assessment Pain Assessment: No/denies pain     Extremity/Trunk Assessment Upper Extremity Assessment Upper Extremity Assessment: Overall WFL for tasks assessed;Right hand dominant   Lower Extremity Assessment Lower Extremity Assessment: Defer to PT evaluation   Cervical / Trunk Assessment Cervical / Trunk Assessment: Normal   Communication Communication Communication: No apparent difficulties   Cognition Arousal: Alert Behavior During Therapy: WFL for tasks assessed/performed Cognition: No apparent impairments                               Following commands: Intact       Cueing  General  Comments   Cueing Techniques: Verbal cues      Exercises     Shoulder Instructions      Home Living Family/patient expects to be discharged to:: Private residence Living Arrangements: Spouse/significant other;Children Available Help at Discharge: Family Type of Home: House Home Access: Stairs to enter;Level entry Entrance Stairs-Number of Steps: 2, no steps at back   Home Layout: One level     Bathroom Shower/Tub: Chief Strategy Officer: Standard     Home Equipment: Cane - single point          Prior Functioning/Environment Prior Level of Function : Independent/Modified Independent;Driving             Mobility Comments: no AD, denies falls ADLs Comments: Indep with ADLs, IADLs    OT Problem List:     OT Treatment/Interventions:        OT Goals(Current goals can be found in the care plan section)   Acute Rehab OT Goals Patient Stated Goal:  home soon OT Goal Formulation: All assessment and education complete, DC therapy   OT Frequency:       Co-evaluation              AM-PAC OT 6 Clicks Daily Activity     Outcome Measure Help from another person eating meals?: None Help from another person taking care of personal grooming?: None Help from another person toileting, which includes using toliet, bedpan, or urinal?: None Help from another person bathing (including washing, rinsing, drying)?: None Help from another person to put on and taking off regular upper body clothing?: None Help from another person to put on and taking off regular lower body clothing?: None 6 Click Score: 24   End of Session Nurse Communication: Mobility status  Activity Tolerance: Patient tolerated treatment well Patient left: in chair;with call bell/phone within reach;with chair alarm set  OT Visit Diagnosis: Muscle weakness (generalized) (M62.81)                Time: 9155-9097 OT Time Calculation (min): 18 min Charges:  OT General Charges $OT Visit: 1 Visit OT Evaluation $OT Eval Low Complexity: 1 Low  Mliss NOVAK, OTR/L Acute Rehab Services Office: [phone]   Mliss Fish 03/23/2024, 9:54 AM

## 2024-03-23 NOTE — Evaluation (Signed)
 Physical Therapy Evaluation Patient Details Name: Michael Bryant MRN: [id number] DOB: 1933/07/05 Today's Date: 03/23/2024  History of Present Illness  Pt is an 89y.o. male admitted on 03/21/24 due to complaints of weakness and fever of 101 at oncology clinic. Found to have Influenza A. PMH: CAD, CABG, HTN, HLD, VT, CHF, and colon cancer.  Clinical Impression  PTA, pt was IND with all mobility and enjoys working in the yard. He reports his vision has gotten worse and he no longer drives; spouse provides assistance with IADLs and transportation. At eval, he is SPV-MOD I for all mobility and appears to be functioning near baseline. He is limited by decreased muscular endurance and activity tolerance and he will continue to benefit from skilled therapy intervention while admitted to acute hospital. He will not need follow up therapy at discharge.         If plan is discharge home, recommend the following: A little help with walking and/or transfers;A little help with bathing/dressing/bathroom;Assist for transportation;Assistance with cooking/housework   Can travel by private vehicle        Equipment Recommendations None recommended by PT  Recommendations for Other Services       Functional Status Assessment Patient has had a recent decline in their functional status and demonstrates the ability to make significant improvements in function in a reasonable and predictable amount of time.     Precautions / Restrictions Precautions Precautions: Fall Precaution/Restrictions Comments: low fall risk Restrictions Weight Bearing Restrictions Per Provider Order: No      Mobility  Bed Mobility Overal bed mobility: Modified Independent             General bed mobility comments: up in recliner at time of arrival, returns to bed at end of session at MOD I    Transfers Overall transfer level: Independent Equipment used: None                    Ambulation/Gait Ambulation/Gait  assistance: Supervision Gait Distance (Feet): 100 Feet Assistive device: None   Gait velocity: decreased     General Gait Details: close SPV while ambulating over level surfaces, no LOB noted; pt with decreased activity tolerance compared to baseline  Stairs            Wheelchair Mobility     Tilt Bed    Modified Rankin (Stroke Patients Only)       Balance Overall balance assessment: No apparent balance deficits (not formally assessed)                                           Pertinent Vitals/Pain Pain Assessment Pain Assessment: No/denies pain    Home Living Family/patient expects to be discharged to:: Private residence Living Arrangements: Spouse/significant other;Children Available Help at Discharge: Family Type of Home: House Home Access: Stairs to [employer] entry Entrance Stairs-Rails: None Entrance Stairs-Number of Steps: 2, no steps at back   Home Layout: One level Home Equipment: Cane - single Librarian, Academic (2 wheels) Additional Comments: IND without device, spouse in good health and provides transportation due to patient's worsening eye sight    Prior Function Prior Level of Function : Independent/Modified Independent             Mobility Comments: no AD, denies falls ADLs Comments: Indep with ADLs, IADLs, spouse drives     Extremity/Trunk Assessment   Upper Extremity  Assessment Upper Extremity Assessment: Defer to OT evaluation    Lower Extremity Assessment Lower Extremity Assessment: Generalized weakness    Cervical / Trunk Assessment Cervical / Trunk Assessment: Normal  Communication   Communication Communication: No apparent difficulties    Cognition Arousal: Alert Behavior During Therapy: WFL for tasks assessed/performed                             Following commands: Intact       Cueing Cueing Techniques: Verbal cues     General Comments General comments (skin integrity,  edema, etc.): mild SOB noted with ambulation, spO2 sats >95% on RA with all mobility, productive cough    Exercises     Assessment/Plan    PT Assessment Patient needs continued PT services  PT Problem List Decreased strength;Decreased activity tolerance       PT Treatment Interventions Functional mobility training;Therapeutic exercise;Stair training    PT Goals (Current goals can be found in the Care Plan section)  Acute Rehab PT Goals Patient Stated Goal: get stronger and go back home PT Goal Formulation: With patient Time For Goal Achievement: 04/06/24 Potential to Achieve Goals: Good    Frequency Min 2X/week     Co-evaluation               AM-PAC PT 6 Clicks Mobility  Outcome Measure Help needed turning from your back to your side while in a flat bed without using bedrails?: None Help needed moving from lying on your back to sitting on the side of a flat bed without using bedrails?: None Help needed moving to and from a bed to a chair (including a wheelchair)?: None Help needed standing up from a chair using your arms (e.g., wheelchair or bedside chair)?: None Help needed to walk in hospital room?: A Little Help needed climbing 3-5 steps with a railing? : A Little 6 Click Score: 22    End of Session Equipment Utilized During Treatment: Gait belt Activity Tolerance: Patient tolerated treatment well Patient left: in bed;with call bell/phone within reach;with bed alarm set Nurse Communication: Mobility status PT Visit Diagnosis: Muscle weakness (generalized) (M62.81)    Time: 9064-9051 PT Time Calculation (min) (ACUTE ONLY): 13 min   Charges:   PT Evaluation $PT Eval Low Complexity: 1 Low             Agastya Meister H. Anuradha Chabot, PT, DPT   [employer] 03/23/2024, 11:00 AM

## 2024-03-24 ENCOUNTER — Inpatient Hospital Stay: Admitting: Hematology

## 2024-03-24 ENCOUNTER — Telehealth (HOSPITAL_COMMUNITY): Payer: Self-pay | Admitting: Pharmacist

## 2024-03-24 ENCOUNTER — Other Ambulatory Visit (HOSPITAL_COMMUNITY): Payer: Self-pay

## 2024-03-24 ENCOUNTER — Inpatient Hospital Stay

## 2024-03-24 ENCOUNTER — Telehealth: Payer: Self-pay

## 2024-03-24 DIAGNOSIS — D649 Anemia, unspecified: Secondary | ICD-10-CM | POA: Diagnosis not present

## 2024-03-24 DIAGNOSIS — N1831 Chronic kidney disease, stage 3a: Secondary | ICD-10-CM | POA: Diagnosis not present

## 2024-03-24 DIAGNOSIS — I10 Essential (primary) hypertension: Secondary | ICD-10-CM | POA: Diagnosis not present

## 2024-03-24 DIAGNOSIS — J101 Influenza due to other identified influenza virus with other respiratory manifestations: Secondary | ICD-10-CM | POA: Diagnosis not present

## 2024-03-24 DIAGNOSIS — I4729 Other ventricular tachycardia: Secondary | ICD-10-CM | POA: Diagnosis not present

## 2024-03-24 DIAGNOSIS — J9601 Acute respiratory failure with hypoxia: Secondary | ICD-10-CM | POA: Diagnosis not present

## 2024-03-24 DIAGNOSIS — E871 Hypo-osmolality and hyponatremia: Secondary | ICD-10-CM | POA: Diagnosis not present

## 2024-03-24 DIAGNOSIS — J329 Chronic sinusitis, unspecified: Secondary | ICD-10-CM | POA: Diagnosis not present

## 2024-03-24 DIAGNOSIS — K6389 Other specified diseases of intestine: Secondary | ICD-10-CM | POA: Diagnosis not present

## 2024-03-24 DIAGNOSIS — E440 Moderate protein-calorie malnutrition: Secondary | ICD-10-CM

## 2024-03-24 DIAGNOSIS — R627 Adult failure to thrive: Secondary | ICD-10-CM | POA: Diagnosis not present

## 2024-03-24 DIAGNOSIS — I5043 Acute on chronic combined systolic (congestive) and diastolic (congestive) heart failure: Secondary | ICD-10-CM | POA: Diagnosis not present

## 2024-03-24 LAB — COMPREHENSIVE METABOLIC PANEL WITH GFR
ALT: 48 U/L — ABNORMAL HIGH (ref 0–44)
AST: 43 U/L — ABNORMAL HIGH (ref 15–41)
Albumin: 2.7 g/dL — ABNORMAL LOW (ref 3.5–5.0)
Alkaline Phosphatase: 66 U/L (ref 38–126)
Anion gap: 7 (ref 5–15)
BUN: 12 mg/dL (ref 8–23)
CO2: 30 mmol/L (ref 22–32)
Calcium: 8.7 mg/dL — ABNORMAL LOW (ref 8.9–10.3)
Chloride: 97 mmol/L — ABNORMAL LOW (ref 98–111)
Creatinine, Ser: 0.92 mg/dL (ref 0.61–1.24)
GFR, Estimated: >60 mL/min
Glucose, Bld: 107 mg/dL — ABNORMAL HIGH (ref 70–99)
Potassium: 4 mmol/L (ref 3.5–5.1)
Sodium: 134 mmol/L — ABNORMAL LOW (ref 135–145)
Total Bilirubin: 0.4 mg/dL (ref 0.0–1.2)
Total Protein: 6 g/dL — ABNORMAL LOW (ref 6.5–8.1)

## 2024-03-24 LAB — CBC
HCT: 26.7 % — ABNORMAL LOW (ref 39.0–52.0)
Hemoglobin: 8.5 g/dL — ABNORMAL LOW (ref 13.0–17.0)
MCH: 28.5 pg (ref 26.0–34.0)
MCHC: 31.8 g/dL (ref 30.0–36.0)
MCV: 89.6 fL (ref 80.0–100.0)
Platelets: 346 K/uL (ref 150–400)
RBC: 2.98 MIL/uL — ABNORMAL LOW (ref 4.22–5.81)
RDW: 14.6 % (ref 11.5–15.5)
WBC: 4.5 K/uL (ref 4.0–10.5)
nRBC: 0 % (ref 0.0–0.2)

## 2024-03-24 LAB — MAGNESIUM: Magnesium: 1.9 mg/dL (ref 1.7–2.4)

## 2024-03-24 MED ORDER — FUROSEMIDE 40 MG PO TABS
40.0000 mg | ORAL_TABLET | Freq: Every day | ORAL | 0 refills | Status: DC | PRN
  Filled 2024-03-24: qty 30, 30d supply, fill #0

## 2024-03-24 MED ORDER — OSELTAMIVIR PHOSPHATE 30 MG PO CAPS
30.0000 mg | ORAL_CAPSULE | Freq: Two times a day (BID) | ORAL | 0 refills | Status: AC
  Filled 2024-03-24: qty 5, 3d supply, fill #0

## 2024-03-24 MED ORDER — LORATADINE 10 MG PO TABS: 10.0000 mg | ORAL_TABLET | Freq: Every day | ORAL | Status: AC

## 2024-03-24 MED ORDER — SALINE SPRAY 0.65 % NA SOLN: 2.0000 | Freq: Three times a day (TID) | NASAL | Status: AC

## 2024-03-24 MED ORDER — ENSURE PLUS HIGH PROTEIN PO LIQD
237.0000 mL | Freq: Two times a day (BID) | ORAL | Status: DC
  Administered 2024-03-24: 237 mL via ORAL

## 2024-03-24 MED ORDER — VALSARTAN 320 MG PO TABS
320.0000 mg | ORAL_TABLET | Freq: Every day | ORAL | 0 refills | Status: DC
  Filled 2024-03-24: qty 90, 90d supply, fill #0

## 2024-03-24 NOTE — Telephone Encounter (Signed)
 Patient referred to infusion pharmacy team for ambulatory infusion of IV iron .  Insurance - [employer] Medicare Site of care - Site of care: [facility] Dx code - I50.22/D50.9 IV Iron  Therapy - Feraheme 510mg  x 1. Venofer  300mg  x 1 dose received inpatient on 03/23/2024 Infusion appointments - Scheduling team will schedule patient as soon as possible.    Sherry Pennant, PharmD, MPH, BCPS, CPP Clinical Pharmacist

## 2024-03-24 NOTE — Progress Notes (Signed)
 Discharge meds in a secure bag delivered to patient by this RN

## 2024-03-24 NOTE — Discharge Summary (Signed)
 Total:  Physician Discharge Summary  Michael Bryant FMW:[id number] DOB: June 10, 1933 DOA: 03/21/2024  PCP: Corlis Pagan, NP  Admit date: 03/21/2024 Discharge date: 03/24/2024  Admitted From: Home Disposition: Home Recommendations for Outpatient Follow-up:  Outpatient follow-up with PCP in 1 week Oncology to reschedule outpatient follow-up Check CBC and CMP in 1 week Please follow up on the following pending results: None  Home Health: No need identified Equipment/Devices: No need identified  Discharge Condition: Stable CODE STATUS: Full code   Follow-up Information     Corlis Pagan, NP. Schedule an appointment as soon as possible for a visit in 1 week(s).   Contact information: [address] [name] [address] [phone]                 Hospital course 89 year old M with PMH of CAD/CABG and VF arrest in 2003, combined CHF, VT on amiodarone , CKD-3A, HTN, HLD, recent diagnosis of colon cancer and iron  deficiency anemia sent to ED from oncology office due to fever to 101 F.  Patient and family reports progressive dyspnea, fatigue and DOE for about 2 days.  Also about 10 pound weight loss since Thanksgiving.  No report of melena or hematochezia but patient and family not quite sure.  He is on low-dose aspirin .  Denies NSAID use.   It seems like patient had recent EGD and colonoscopy with [facility].  There was some concern about colonic mass for which she was referred to oncology.  He was also scheduled for CT abdomen and pelvis with contrast on 1/12.   In ED, stable vitals.  Na 129. Cr 1.28.  Hgb 9.8 (was 9.9 on 12/18).  proBNP 1667 (normal for age).  Troponin 38 and 35.  Influenza A positive.  2V CXR without acute finding.  Maxillofacial CT suggested frontal, maxillary and ethmoid sinus disease and complete occlusion of the ostiomeatal complexes bilaterally.  CT angio chest negative for PE but suggested bronchiectasis in LLL with bronchial wall thickening  bilaterally.  Blood culture and TTE ordered.  Received 500 cc LR bolus, IV Lasix  and started on Tamiflu .  Admitted with acute respiratory failure with hypoxia due to working diagnosis of acute CHF and influenza A infection, and hyponatremia.     TTE without significant finding.  CT abdomen and pelvis ordered as recommended by GI to assess colonic mass, and showed questionable irregular wall thickening of the ascending colon, multilevel DDD and severe atherosclerotic plaque.  On the day of discharge, patient felt well.  Per [facility], colonic biopsies still pending.  Notified oncology, Dr. Lanny who will reschedule outpatient follow-up.  He is discharged on Tamiflu  for 5 more doses to complete treatment course.  No need identified by therapy.  See individual problem list below for more.   Problems addressed during this hospitalization Acute respiratory failure with hypoxia: Reportedly hypoxic to 88% on RA requiring 2 L on admission.  Likely due to influenza A infection.  Respiratory failure seems to have resolved.  Currently saturating in mid 90s on RA at rest.  Appears euvolemic on exam.  proBNP within normal for age.  No signs of pulmonary edema or congestion on CXR.  CT angio chest suggested LLL bronchiectasis with bronchial wall thickening bilaterally.  TTE reassuring.  Respiratory failure resolved. -Continue Tamiflu  for 5 more doses to complete 5 days course.   Influenza A infection: CT angio chest as above. - Tamiflu  as above.   Chronic sinusitis: Has nasal congestion.  Also reports history of seasonal  allergies.  He uses chlorpheniramine at home.  Doubt bacterial sinusitis at this point. -Continue saline nose spray every 8 hours and Nasonex  -Changed chlorpheniramine to loratadine .     Chronic HFimpEF: TTE with LVEF of 55 to 60% (was 45 to 50% in 04/2023) and normal DD.  Appears euvolemic on exam.  proBNP within normal.  CXR without pulmonary edema or vascular congestion.  Received IV Lasix   20 mg x 1 in ED.  Not on diuretics at home but takes chlorthalidone  for blood pressure. - P.o. Lasix  40 mg daily as needed -Discontinued chlorthalidone  due to risk of dehydration.   Please see of CAD s/p CABG 2003: No anginal symptoms.  Slightly elevated troponin without significant delta likely demand ischemia.  TTE reassuring. -Continue medical management  Essential hypertension: Normotensive for most part. - Increase Diovan  from 160 mg daily to 320 mg daily -Discontinued chlorthalidone  due to risk of dehydration, hyponatremia and hypokalemia -P.o. Lasix  40 mg daily as needed - Reassess blood pressure and renal functions in 1 week and adjust meds as appropriate   Hypercholesterolemia -Continue zocor  20mg  daily    NSVT/history of VF arrest -Continue low-dose amiodarone    Hypovolemic hyponatremia: Mild.  Likely due to poor p.o. intake and chlorthalidone .  Improved. - Discontinued chlorthalidone .   Iron  deficiency anemia: No report of melena or hematochezia.  Per [facility], recent EGD showed gastritis and colonoscopy showed colonic mass.  Pathology from ascending colon mass pending.  Takes low-dose aspirin  at home.  Anemia panel with iron  deficiency.  H&H remained stable after initial drop.  Received IV Venofer  300 mg x 1  - Ambulatory referral to infusion center for more iron  - Recheck CBC at follow up  Colonic mass: Per [facility], colonoscopy 03/18/2024 with ascending colon mass.  CT abdomen and pelvis as above. -Per on-call GI, biopsies from recent colonoscopy still pending. -Oncology, Dr. Lanny notified and she will reschedule outpatient visit.  AKI: Initially thought to have CKD but creatinine normalized and GFR improved. - Discontinued chlorthalidone  -Recheck renal function at follow-up   Generalized weakness: Progressive fatigue, dyspnea and weight loss.  Could be due to anemia and malignancy.  Improved. - No need identified by therapy.   Hypokalemia: Resolved.   Elevated  liver enzymes: Metastasis?  Mild.  Improved. - Recheck at follow-up.     Moderate malnutrition Body mass index is 25.5 kg/m. Nutrition Problem: Moderate Malnutrition Etiology: acute illness Signs/Symptoms: mild fat depletion, mild muscle depletion, energy intake < or equal to 75% for > or equal to 1 month Interventions: Magic cup, MVI     Consultations: None  Time spent 35  minutes  Vital signs Vitals:   03/24/24 0046 03/24/24 0500 03/24/24 0536 03/24/24 0548  BP: (!) 147/74  (!) 91/57 (!) 150/60  Pulse: 78  70 66  Temp: (!) 97.4 F (36.3 C)   (!) 97.5 F (36.4 C)  Resp: 19  19 18   Height:      Weight:  80.6 kg    SpO2: 98%  100% 96%  TempSrc: Oral   Oral  BMI (Calculated):  25.5       Discharge exam  GENERAL: No apparent distress.  Nontoxic. HEENT: MMM.  Vision and hearing grossly intact.  NECK: Supple.  No apparent JVD.  RESP:  No IWOB.  Fair aeration bilaterally. CVS:  RRR. Heart sounds normal.  ABD/GI/GU: BS+. Abd soft, NTND.  MSK/EXT:  Moves extremities. No apparent deformity. No edema.  SKIN: no apparent skin lesion or wound  NEURO: Awake and alert. Oriented appropriately.  No apparent focal neuro deficit. PSYCH: Calm. Normal affect.   Discharge Instructions Discharge Instructions     Amb Referral to Intravenous Iron  Therapy   Complete by: As directed    You have been referred to [facility] Infusion team for IV Iron  Infusions. The infusion pharmacy team will reach out to you with appointment information.    Primary Diagnosis Code for IV Iron : D50.9 - Iron  deficiency Anemia   Secondary diagnosis code for IV iron : I50.xx - Heart failure related dx codes   Discharge instructions   Complete by: As directed    It has been a pleasure taking care of you!  You were hospitalized due to influenza infection for which you have been started on Tamiflu .  We are discharging you on more of this medication to complete treatment course.  We have made some adjustment to  your blood pressure medication during this hospitalization.  Please review your new medication list and the directions on your medications before you take them.  Follow-up with your primary care doctor in 1 to 2 weeks or sooner if needed.  Oncology to reschedule outpatient follow-up.    Take care,   Increase activity slowly   Complete by: As directed       Allergies as of 03/24/2024       Reactions   Codeine Other (See Comments), Hives   wont go to sleep        Medication List     STOP taking these medications    chlorpheniramine 4 MG tablet Commonly known as: CHLOR-TRIMETON   chlorthalidone  25 MG tablet Commonly known as: HYGROTON        TAKE these medications    amiodarone  200 MG tablet Commonly known as: PACERONE  Take 0.5 tablets (100 mg total) by mouth daily.   aspirin  EC 81 MG tablet Take 81 mg by mouth daily.   azelastine  0.1 % nasal spray Commonly known as: ASTELIN  Place 2 sprays into both nostrils 2 (two) times daily. Use in each nostril as directed   beta carotene w/minerals tablet Take 1 tablet by mouth daily.   bisacodyl 5 MG EC tablet Generic drug: bisacodyl Take 5 mg by mouth daily as needed.   CINNAMON PO Take 1 tablet by mouth daily.   Diclofenac  Sodium CR 100 MG 24 hr tablet Take 100 mg by mouth daily as needed.   fish oil-omega-3 fatty acids 1000 MG capsule Take 1 g by mouth daily.   FLUoxetine  10 MG capsule Commonly known as: PROZAC  Take 1 capsule by mouth daily as needed.   furosemide  40 MG tablet Commonly known as: Lasix  Take 1 tablet (40 mg total) by mouth daily as needed for fluid or edema. Start taking on: March 27, 2024   GELATIN PO Take 9 g by mouth daily.   Glucosamine 500 MG Caps Take 1 capsule by mouth daily.   loratadine  10 MG tablet Commonly known as: CLARITIN  Take 1 tablet (10 mg total) by mouth daily.   mometasone  50 MCG/ACT nasal spray Commonly known as: Nasonex  Place 2 sprays into the nose  daily. What changed:  when to take this reasons to take this   nitroGLYCERIN  0.4 MG SL tablet Commonly known as: NITROSTAT  Place 1 tablet (0.4 mg total) under the tongue every 5 (five) minutes as needed for chest pain.   omeprazole 10 MG capsule Commonly known as: PRILOSEC Take 10 mg by mouth daily.   oseltamivir  30 MG capsule Commonly known  as: TAMIFLU  Take 1 capsule (30 mg total) by mouth 2 (two) times daily for 5 doses.   Prostate Health Caps Take 1 capsule by mouth daily.   simvastatin  20 MG tablet Commonly known as: ZOCOR  Take 1 tablet (20 mg total) by mouth daily at 6 PM.   sodium chloride  0.65 % Soln nasal spray Commonly known as: OCEAN Place 2 sprays into both nostrils every 8 (eight) hours.   SYSTANE OP Place 1 drop into both eyes daily as needed (dry eyes).   tamsulosin  0.4 MG Caps capsule Commonly known as: FLOMAX  Take 0.4 mg by mouth as needed.   valsartan  320 MG tablet Commonly known as: DIOVAN  Take 1 tablet (320 mg total) by mouth daily. What changed:  medication strength See the new instructions.   Vitamin D  50 MCG (2000 UT) Caps Take 2,000 Units by mouth daily.         Procedures/Studies:   CT ABDOMEN PELVIS W CONTRAST Result Date: 03/23/2024 EXAM: CT ABDOMEN AND PELVIS WITH CONTRAST 03/23/2024 10:53:04 AM TECHNIQUE: CT of the abdomen and pelvis was performed with the administration of 100 mL of iohexol  (OMNIPAQUE ) 300 MG/ML solution. Multiplanar reformatted images are provided for review. Automated exposure control, iterative reconstruction, and/or weight-based adjustment of the mA/kV was utilized to reduce the radiation dose to as low as reasonably achievable. COMPARISON: None available. CLINICAL HISTORY: Colon mass. FINDINGS: LOWER CHEST: Diffuse bronchial wall thickening of the visualized lower lobes. Associated mucous plugging noted. LIVER: The liver is unremarkable. GALLBLADDER AND BILE DUCTS: Gallbladder is unremarkable. No biliary ductal  dilatation. SPLEEN: No acute abnormality. PANCREAS: Diffusely atrophic pancreas. No focal lesion. Otherwise normal pancreatic contour. No surrounding inflammatory changes. No main pancreatic ductal dilatation. ADRENAL GLANDS: No acute abnormality. KIDNEYS, URETERS AND BLADDER: No stones in the kidneys or ureters. No hydronephrosis. No perinephric or periureteral stranding. No filling defects of the partially visualized collecting systems on delayed imaging. Urinary bladder is unremarkable. GI AND BOWEL: Small hiatal hernia. Stomach demonstrates no acute abnormality. There is no bowel obstruction. No small bowel thickening or dilatation. No large bowel dilatation. Question ascending colon irregular wall thickening (5.39). Acute contrast in the rectum. The appendix is unremarkable. Colonic diverticulosis. PO contrast reaches the rectum. PERITONEUM AND RETROPERITONEUM: No ascites. No free air. VASCULATURE: Aorta is normal in caliber. Severe atherosclerotic plaque. Coronary artery calcifications. LYMPH NODES: No lymphadenopathy. REPRODUCTIVE ORGANS: The prostate is enlarged up to 5 cm. BONES AND SOFT TISSUES: Multilevel degenerative change of the spine with intervertebral disc space vacuum phenomenon and posterior disc osteophyte complex formation at the T12-L1, L1-L2, L2-L3, L3-L4, L4-L5, L5-S1 levels. Mild retrolisthesis of L2 on L3 and L3 on L4. No suspicious lytic or blastic osseous lesion. No acute fracture. No focal soft tissue abnormality. IMPRESSION: 1. Questionable irregular wall thickening of the ascending colon, which may reflect an underlying mass. No associated obstruction. 2. Severe atherosclerotic plaque. Electronically signed by: Morgane Naveau MD MD 03/23/2024 11:23 PM EST RP Workstation: [id number]   ECHOCARDIOGRAM COMPLETE Result Date: 03/22/2024    ECHOCARDIOGRAM REPORT   Patient Name:   Michael Bryant Date of Exam: 03/22/2024 Medical Rec #:  [id number]     Height:       70.0 in Accession #:     [id number]    Weight:       178.4 lb Date of Birth:  25-Nov-1933      BSA:          1.988 m Patient Age:    63 years  BP:           128/64 mmHg Patient Gender: M             HR:           79 bpm. Exam Location:  Inpatient Procedure: 2D Echo (Both Spectral and Color Flow Doppler were utilized during            procedure). Indications:    acute systolic chf  History:        Patient has prior history of Echocardiogram examinations, most                 recent 04/24/2023. CAD, Prior CABG, chronic kidney disease; Risk                 Factors:Hypertension and Dyslipidemia.  Sonographer:    Tinnie Barefoot RDCS Referring Phys: [id number] [facility]  Sonographer Comments: Suboptimal subcostal window. IMPRESSIONS  1. Left ventricular ejection fraction, by estimation, is 55 to 60%. The left ventricle has normal function. The left ventricle has no regional wall motion abnormalities. Left ventricular diastolic parameters were normal.  2. Right ventricular systolic function is normal. The right ventricular size is normal.  3. The mitral valve is normal in structure. No evidence of mitral valve regurgitation. No evidence of mitral stenosis.  4. The aortic valve is normal in structure. Aortic valve regurgitation is not visualized. No aortic stenosis is present.  5. The inferior vena cava is normal in size with <50% respiratory variability, suggesting right atrial pressure of 8 mmHg. Comparison(s): Prior images reviewed side by side. EF has recovered. FINDINGS  Left Ventricle: Left ventricular ejection fraction, by estimation, is 55 to 60%. The left ventricle has normal function. The left ventricle has no regional wall motion abnormalities. The left ventricular internal cavity size was normal in size. There is  no left ventricular hypertrophy. Left ventricular diastolic parameters were normal. Right Ventricle: The right ventricular size is normal. No increase in right ventricular wall thickness. Right ventricular systolic  function is normal. Left Atrium: Left atrial size was normal in size. Right Atrium: Right atrial size was normal in size. Pericardium: There is no evidence of pericardial effusion. Mitral Valve: The mitral valve is normal in structure. No evidence of mitral valve regurgitation. No evidence of mitral valve stenosis. Tricuspid Valve: The tricuspid valve is normal in structure. Tricuspid valve regurgitation is not demonstrated. No evidence of tricuspid stenosis. Aortic Valve: The aortic valve is normal in structure. Aortic valve regurgitation is not visualized. No aortic stenosis is present. Pulmonic Valve: The pulmonic valve was normal in structure. Pulmonic valve regurgitation is trivial. No evidence of pulmonic stenosis. Aorta: The aortic root is normal in size and structure. Venous: The inferior vena cava is normal in size with less than 50% respiratory variability, suggesting right atrial pressure of 8 mmHg. IAS/Shunts: The interatrial septum was not well visualized.  LEFT VENTRICLE PLAX 2D LVIDd:         4.60 cm     Diastology LVIDs:         3.00 cm     LV e' medial:    7.18 cm/s LV PW:         1.20 cm     LV E/e' medial:  11.7 LV IVS:        1.10 cm     LV e' lateral:   8.27 cm/s LVOT diam:     2.10 cm     LV E/e' lateral: 10.2 LV SV:  69 LV SV Index:   34 LVOT Area:     3.46 cm  LV Volumes (MOD) LV vol d, MOD A2C: 79.2 ml LV vol d, MOD A4C: 70.6 ml LV vol s, MOD A2C: 38.2 ml LV vol s, MOD A4C: 37.8 ml LV SV MOD A2C:     41.0 ml LV SV MOD A4C:     70.6 ml LV SV MOD BP:      35.6 ml RIGHT VENTRICLE             IVC RV Basal diam:  2.50 cm     IVC diam: 2.00 cm RV S prime:     14.50 cm/s TAPSE (M-mode): 1.7 cm      PULMONARY VEINS                             Diastolic Velocity: 37.70 cm/s                             S/D Velocity:       1.20                             Systolic Velocity:  43.40 cm/s LEFT ATRIUM             Index        RIGHT ATRIUM           Index LA diam:        3.30 cm 1.66 cm/m   RA  Area:     12.70 cm LA Vol (A2C):   60.3 ml 30.33 ml/m  RA Volume:   29.50 ml  14.84 ml/m LA Vol (A4C):   47.9 ml 24.09 ml/m LA Biplane Vol: 55.6 ml 27.97 ml/m  AORTIC VALVE LVOT Vmax:   109.00 cm/s LVOT Vmean:  71.900 cm/s LVOT VTI:    0.198 m  AORTA Ao Root diam: 3.50 cm Ao Asc diam:  3.00 cm MITRAL VALVE MV Area (PHT): 2.83 cm     SHUNTS MV Decel Time: 268 msec     Systemic VTI:  0.20 m MV E velocity: 84.10 cm/s   Systemic Diam: 2.10 cm MV A velocity: 115.00 cm/s MV E/A ratio:  0.73 Franck Azobou Tonleu Electronically signed by Joelle Ren Ny Signature Date/Time: 03/22/2024/10:43:44 AM    Final    CT Maxillofacial Wo Contrast Result Date: 03/21/2024 CLINICAL DATA:  Sinus pain EXAM: CT MAXILLOFACIAL WITHOUT CONTRAST TECHNIQUE: Multidetector CT imaging of the maxillofacial structures was performed. Multiplanar CT image reconstructions were also generated. RADIATION DOSE REDUCTION: This exam was performed according to the departmental dose-optimization program which includes automated exposure control, adjustment of the mA and/or kV according to patient size and/or use of iterative reconstruction technique. COMPARISON:  02/27/2024 FINDINGS: Osseous: No fracture or mandibular dislocation. No destructive process. Orbits: Negative. No traumatic or inflammatory finding. Sinuses: There is complete opacification of the frontal and maxillary sinuses. Opacification of the anterior ethmoid air cells. Sphenoid sinus is patent. There is occlusion of the ostiomeatal complexes bilaterally. Soft tissues: Negative. Limited intracranial: No significant or unexpected finding. IMPRESSION: 1. Frontal, maxillary, and ethmoid sinus disease as above. Complete occlusion of the ostiomeatal complexes bilaterally. Electronically Signed   By: Ozell Daring M.D.   On: 03/21/2024 16:55   CT Angio Chest PE W and/or Wo Contrast Result Date: 03/21/2024 CLINICAL DATA:  Pulmonary  embolism suspected, high probability. EXAM: CT  ANGIOGRAPHY CHEST WITH CONTRAST TECHNIQUE: Multidetector CT imaging of the chest was performed using the standard protocol during bolus administration of intravenous contrast. Multiplanar CT image reconstructions and MIPs were obtained to evaluate the vascular anatomy. RADIATION DOSE REDUCTION: This exam was performed according to the departmental dose-optimization program which includes automated exposure control, adjustment of the mA and/or kV according to patient size and/or use of iterative reconstruction technique. CONTRAST:  75mL OMNIPAQUE  IOHEXOL  350 MG/ML SOLN COMPARISON:  None Available. FINDINGS: Cardiovascular: The heart is normal in size and there is no significant pericardial effusion. Three-vessel coronary artery calcifications are noted. There is atherosclerotic calcification of the aorta without evidence of aneurysm. The pulmonary trunk is normal in caliber. Evidence of pulmonary embolism is seen. Mediastinum/Nodes: No mediastinal, hilar, or axillary lymphadenopathy is seen. The thyroid  gland, trachea, and esophagus are within normal limits. There is a small hiatal hernia. Lungs/Pleura: Bronchial wall thickening is present bilaterally. Atelectasis is noted at the lung bases bilaterally. No effusion or pneumothorax is seen. Bronchiectasis is present in the left lower lobe. Upper Abdomen: No acute abnormality. Musculoskeletal: Sternotomy wires are noted. There are degenerative changes in the thoracic spine. No acute osseous abnormality is seen. Loop recorder device is present in the anterior chest wall on the left. Review of the MIP images confirms the above findings. IMPRESSION: 1. No evidence of pulmonary embolism. 2. Bronchiectasis in the left lower lobe with bronchial wall thickening bilaterally, which may be infectious or inflammatory. 3. Small hiatal hernia. 4. Coronary artery calcifications. 5. Aortic atherosclerosis. Electronically Signed   By: Leita Birmingham M.D.   On: 03/21/2024 16:47   DG  Chest 2 View Result Date: 03/21/2024 CLINICAL DATA:  Shortness of breath. Hypoxia. Colon carcinoma. Coronary artery disease. EXAM: CHEST - 2 VIEW COMPARISON:  02/27/2024 FINDINGS: The heart size and mediastinal contours are within normal limits. Prior CABG again noted. Cardiac loop recorder again seen in the left anterior chest wall. Both lungs are clear. The visualized skeletal structures are unremarkable. IMPRESSION: No active cardiopulmonary disease. Electronically Signed   By: Norleen DELENA Kil M.D.   On: 03/21/2024 14:17   CT Maxillofacial WO CM Result Date: 02/27/2024 CLINICAL DATA:  Persistent headache after antibiotic treatment. Evaluate for sinus infection. EXAM: CT MAXILLOFACIAL WITHOUT CONTRAST TECHNIQUE: Multidetector CT imaging of the maxillofacial structures was performed. Multiplanar CT image reconstructions were also generated. RADIATION DOSE REDUCTION: This exam was performed according to the departmental dose-optimization program which includes automated exposure control, adjustment of the mA and/or kV according to patient size and/or use of iterative reconstruction technique. COMPARISON:  11/24/2023 FINDINGS: Osseous: No fracture or mandibular dislocation. No destructive process. Mild periodontal disease involving the posterior left upper and lower molar teeth. Orbits: Negative. No traumatic or inflammatory finding. Sinuses: Paranasal sinuses are well developed. There is complete opacification of the frontal maxillary sinuses with moderate opacification over the ethmoid air cells and opacification of the ostiomeatal complexes bilaterally. These findings are unchanged. Sphenoid sinuses clear. Mastoid air cells are clear. Minimal deviation of the nasal septum to the right. Mild associated mucoperiosteal thickening. Soft tissues: Negative. Limited intracranial: No significant or unexpected finding. IMPRESSION: 1. Moderate chronic sinus inflammatory disease as described without significant change. 2.  Mild periodontal disease involving the posterior left upper and lower molar teeth. Electronically Signed   By: Toribio Agreste M.D.   On: 02/27/2024 10:14   DG Chest 2 View Result Date: 02/27/2024 CLINICAL DATA:  Chest congestion for 2 months.  Chest pain. EXAM: CHEST - 2 VIEW COMPARISON:  February 01, 2019. FINDINGS: The heart size and mediastinal contours are within normal limits. Status post coronary artery bypass graft. Both lungs are clear. The visualized skeletal structures are unremarkable. IMPRESSION: No active cardiopulmonary disease. Electronically Signed   By: Lynwood Landy Raddle M.D.   On: 02/27/2024 10:09       The results of significant diagnostics from this hospitalization (including imaging, microbiology, ancillary and laboratory) are listed below for reference.     Microbiology: Recent Results (from the past 240 hours)  Blood culture (routine x 2)     Status: None (Preliminary result)   Collection Time: 03/21/24  2:15 PM   Specimen: Left Antecubital; Blood  Result Value Ref Range Status   Specimen Description   Final    LEFT ANTECUBITAL BOTTLES DRAWN AEROBIC AND ANAEROBIC Performed at [facility], [address], [place], [name] [address]    Special Requests   Final    Blood Culture results may not be optimal due to an inadequate volume of blood received in culture bottles Performed at [facility], [address] [place], [name] [address]    Culture   Final    NO GROWTH 3 DAYS Performed at [facility], [address] [place], [name] [address]    Report Status PENDING  Incomplete  Blood culture (routine x 2)     Status: None (Preliminary result)   Collection Time: 03/21/24  2:20 PM   Specimen: BLOOD LEFT FOREARM  Result Value Ref Range Status   Specimen Description   Final    BLOOD LEFT FOREARM BOTTLES DRAWN AEROBIC AND ANAEROBIC Performed at [facility], [address] [place], [name]  [address]    Special Requests   Final    Blood Culture results may not be optimal due to an inadequate volume of blood received in culture bottles Performed at [facility], [address] [place], [name] [address]    Culture   Final    NO GROWTH 3 DAYS Performed at [facility], [address] [place], [name] [address]    Report Status PENDING  Incomplete  Resp panel by RT-PCR (RSV, Flu A&B, Covid) Anterior Nasal Swab     Status: Abnormal   Collection Time: 03/21/24  2:43 PM   Specimen: Anterior Nasal Swab  Result Value Ref Range Status   SARS Coronavirus 2 by RT PCR NEGATIVE NEGATIVE Final    Comment: (NOTE) SARS-CoV-2 target nucleic acids are NOT DETECTED.  The SARS-CoV-2 RNA is generally detectable in upper respiratory specimens during the acute phase of infection. The lowest concentration of SARS-CoV-2 viral copies this assay can detect is 138 copies/mL. A negative result does not preclude SARS-Cov-2 infection and should not be used as the sole basis for treatment or other patient management decisions. A negative result may occur with  improper specimen collection/handling, submission of specimen other than nasopharyngeal swab, presence of viral mutation(s) within the areas targeted by this assay, and inadequate number of viral copies(<138 copies/mL). A negative result must be combined with clinical observations, patient history, and epidemiological information. The expected result is Negative.  Fact Sheet for Patients:  bloggercourse.com  Fact Sheet for Healthcare Providers:  seriousbroker.it  This test is no t yet approved or cleared by the [place]  FDA and  has been authorized for detection and/or diagnosis of SARS-CoV-2 by FDA under an Emergency Use Authorization (EUA). This  EUA will remain  in effect (meaning this test can be used) for the duration of the COVID-19 declaration under  Section 564(b)(1) of the Act, 21 U.S.C.section 360bbb-3(b)(1), unless the authorization is terminated  or revoked sooner.       Influenza A by PCR POSITIVE (A) NEGATIVE Final   Influenza B by PCR NEGATIVE NEGATIVE Final    Comment: (NOTE) The Xpert Xpress SARS-CoV-2/FLU/RSV plus assay is intended as an aid in the diagnosis of influenza from Nasopharyngeal swab specimens and should not be used as a sole basis for treatment. Nasal washings and aspirates are unacceptable for Xpert Xpress SARS-CoV-2/FLU/RSV testing.  Fact Sheet for Patients: bloggercourse.com  Fact Sheet for Healthcare Providers: seriousbroker.it  This test is not yet approved or cleared by the [place]  FDA and has been authorized for detection and/or diagnosis of SARS-CoV-2 by FDA under an Emergency Use Authorization (EUA). This EUA will remain in effect (meaning this test can be used) for the duration of the COVID-19 declaration under Section 564(b)(1) of the Act, 21 U.S.C. section 360bbb-3(b)(1), unless the authorization is terminated or revoked.     Resp Syncytial Virus by PCR NEGATIVE NEGATIVE Final    Comment: (NOTE) Fact Sheet for Patients: bloggercourse.com  Fact Sheet for Healthcare Providers: seriousbroker.it  This test is not yet approved or cleared by the [place]  FDA and has been authorized for detection and/or diagnosis of SARS-CoV-2 by FDA under an Emergency Use Authorization (EUA). This EUA will remain in effect (meaning this test can be used) for the duration of the COVID-19 declaration under Section 564(b)(1) of the Act, 21 U.S.C. section 360bbb-3(b)(1), unless the authorization is terminated or revoked.  Performed at [facility], [address] [place], [name] [address]      Labs:  CBC: Recent Labs  Lab 03/21/24 1433 03/22/24 0528 03/23/24 0502  03/24/24 0429  WBC 5.4 5.0 4.8 4.5  NEUTROABS 3.5  --   --   --   HGB 9.8* 8.4* 8.0* 8.5*  HCT 30.3* 25.2* 23.9* 26.7*  MCV 88.9 86.0 86.0 89.6  PLT 423* 380 342 346   BMP &GFR Recent Labs  Lab 03/21/24 1433 03/21/24 1912 03/22/24 0528 03/23/24 0502 03/24/24 0429  NA 129*  --  132* 133* 134*  K 3.6  --  3.4* 3.3* 4.0  CL 90*  --  92* 94* 97*  CO2 30  --  30 29 30   GLUCOSE 109*  --  109* 103* 107*  BUN 21  --  20 16 12   CREATININE 1.28*  --  1.09 1.00 0.92  CALCIUM 9.0  --  8.8* 8.8* 8.7*  MG  --  1.8  --  2.0 1.9   Estimated Creatinine Clearance: 57.3 mL/min (by C-G formula based on SCr of 0.92 mg/dL). Liver & Pancreas: Recent Labs  Lab 03/21/24 1912 03/22/24 0528 03/23/24 0502 03/24/24 0429  AST 71* 62* 51* 43*  ALT 73* 66* 55* 48*  ALKPHOS 67 76 62 66  BILITOT 0.3 0.3 0.3 0.4  PROT 5.7* 6.4* 5.8* 6.0*  ALBUMIN 2.5* 2.8* 2.6* 2.7*   No results for input(s): LIPASE, AMYLASE in the last 168 hours. No results for input(s): AMMONIA in the last 168 hours. Diabetic: No results for input(s): HGBA1C in the last 72 hours. No results for input(s): GLUCAP in the last 168 hours. Cardiac Enzymes: No results for input(s): CKTOTAL, CKMB, CKMBINDEX, TROPONINI in the last 168 hours. Recent Labs    02/27/24 0826 03/21/24 1433  PROBNP 621.0* 1,667.0*   Coagulation Profile: No results for input(s): INR, PROTIME in the last 168 hours. Thyroid  Function Tests: No results for input(s): TSH, T4TOTAL, FREET4, T3FREE, THYROIDAB in the last 72 hours. Lipid Profile: No results for input(s): CHOL, HDL, LDLCALC, TRIG, CHOLHDL, LDLDIRECT in the last 72 hours. Anemia Panel: Recent Labs    03/22/24 0528  VITAMINB12 1,158*  FOLATE 12.2  FERRITIN 249  TIBC 214*  IRON  15*  RETICCTPCT 2.6   Urine analysis:    Component Value Date/Time   COLORURINE YELLOW 02/27/2024 0920   APPEARANCEUR CLEAR 02/27/2024 0920   LABSPEC 1.029 02/27/2024  0920   PHURINE 5.5 02/27/2024 0920   GLUCOSEU NEGATIVE 02/27/2024 0920   HGBUR NEGATIVE 02/27/2024 0920   BILIRUBINUR NEGATIVE 02/27/2024 0920   KETONESUR TRACE (A) 02/27/2024 0920   PROTEINUR 30 (A) 02/27/2024 0920   NITRITE NEGATIVE 02/27/2024 0920   LEUKOCYTESUR NEGATIVE 02/27/2024 0920   Sepsis Labs: Invalid input(s): PROCALCITONIN, LACTICIDVEN   SIGNED:  Ourania Hamler T Sonali Wivell, MD  [facility] 03/24/2024, 5:38 PM

## 2024-03-24 NOTE — Plan of Care (Signed)
 Patient was discharged for home today. Discharge instructions were given to the patient and his wife and they verbalized they understood. All personal belongings were taken with the patient. Vitals were stable. Meds were given to patient from our pharmacy. Patient left via private car with his wife to go home.

## 2024-03-24 NOTE — Telephone Encounter (Signed)
 Auth Submission: NO AUTH NEEDED Site of care: Site of care: [facility] Payer: [employer] medicare Medication & CPT/J Code(s) submitted: Feraheme (ferumoxytol) [id number] Diagnosis Code:  Route of submission (phone, fax, portal):  Phone # Fax # Auth type: Buy/Bill PB Units/visits requested: 510mg  x 1 dose Reference number:  Approval from: 03/24/24 to 06/09/24

## 2024-03-26 ENCOUNTER — Encounter: Payer: Self-pay | Admitting: Internal Medicine

## 2024-03-26 LAB — CULTURE, BLOOD (ROUTINE X 2)
Culture: NO GROWTH
Culture: NO GROWTH

## 2024-03-31 ENCOUNTER — Encounter: Payer: Self-pay | Admitting: *Deleted

## 2024-03-31 ENCOUNTER — Other Ambulatory Visit: Payer: Self-pay | Admitting: Cardiovascular Disease

## 2024-03-31 NOTE — Progress Notes (Signed)
 REceived fax from [facility] to cancel New pt appt and close referral, message sent to scheduler

## 2024-04-02 NOTE — Telephone Encounter (Signed)
 PATIENT NEED MAKE AN OVERDUE APPOINTMENT FOR REFILLS. LAST ATTEMPT

## 2024-04-03 ENCOUNTER — Ambulatory Visit

## 2024-04-03 VITALS — BP 134/68 | HR 54 | Temp 97.3°F | Resp 16 | Ht 70.0 in | Wt 173.0 lb

## 2024-04-03 DIAGNOSIS — N1831 Chronic kidney disease, stage 3a: Secondary | ICD-10-CM | POA: Diagnosis not present

## 2024-04-03 DIAGNOSIS — D649 Anemia, unspecified: Secondary | ICD-10-CM

## 2024-04-03 MED ORDER — SODIUM CHLORIDE 0.9 % IV SOLN
510.0000 mg | Freq: Once | INTRAVENOUS | Status: AC
  Administered 2024-04-03: 510 mg via INTRAVENOUS
  Filled 2024-04-03: qty 17

## 2024-04-03 NOTE — Progress Notes (Signed)
 Diagnosis: Acute Anemia  Provider:  Lonna Coder MD  Procedure: IV Infusion  IV Type: Peripheral, IV Location: L Forearm  Feraheme (Ferumoxytol), Dose: 510 mg  Infusion Start Time: 1149  Infusion Stop Time: 1208  Post Infusion IV Care: Observation period completed and Peripheral IV Discontinued  Discharge: Condition: Good, Destination: Home . AVS Declined  Performed by:  Donny Childes, RN

## 2024-04-06 ENCOUNTER — Inpatient Hospital Stay

## 2024-04-06 ENCOUNTER — Inpatient Hospital Stay: Admitting: Oncology

## 2024-04-10 ENCOUNTER — Encounter (HOSPITAL_COMMUNITY): Payer: Self-pay | Admitting: Family Medicine

## 2024-04-10 ENCOUNTER — Emergency Department (HOSPITAL_COMMUNITY)

## 2024-04-10 ENCOUNTER — Inpatient Hospital Stay (HOSPITAL_COMMUNITY)
Admission: EM | Admit: 2024-04-10 | Discharge: 2024-04-15 | DRG: 682 | Disposition: A | Discharge status: Skilled Nursing Facility | Attending: Family Medicine | Admitting: Family Medicine

## 2024-04-10 ENCOUNTER — Other Ambulatory Visit: Payer: Self-pay

## 2024-04-10 DIAGNOSIS — R6520 Severe sepsis without septic shock: Secondary | ICD-10-CM | POA: Diagnosis not present

## 2024-04-10 DIAGNOSIS — D649 Anemia, unspecified: Secondary | ICD-10-CM | POA: Diagnosis present

## 2024-04-10 DIAGNOSIS — I4729 Other ventricular tachycardia: Secondary | ICD-10-CM | POA: Diagnosis present

## 2024-04-10 DIAGNOSIS — G9340 Encephalopathy, unspecified: Secondary | ICD-10-CM

## 2024-04-10 DIAGNOSIS — E440 Moderate protein-calorie malnutrition: Secondary | ICD-10-CM | POA: Diagnosis present

## 2024-04-10 DIAGNOSIS — R4189 Other symptoms and signs involving cognitive functions and awareness: Secondary | ICD-10-CM

## 2024-04-10 DIAGNOSIS — N179 Acute kidney failure, unspecified: Secondary | ICD-10-CM | POA: Diagnosis present

## 2024-04-10 DIAGNOSIS — I5033 Acute on chronic diastolic (congestive) heart failure: Secondary | ICD-10-CM | POA: Diagnosis present

## 2024-04-10 DIAGNOSIS — A419 Sepsis, unspecified organism: Secondary | ICD-10-CM | POA: Diagnosis present

## 2024-04-10 DIAGNOSIS — J329 Chronic sinusitis, unspecified: Secondary | ICD-10-CM | POA: Diagnosis present

## 2024-04-10 DIAGNOSIS — S065XAA Traumatic subdural hemorrhage with loss of consciousness status unknown, initial encounter: Secondary | ICD-10-CM | POA: Clinically undetermined

## 2024-04-10 DIAGNOSIS — R410 Disorientation, unspecified: Principal | ICD-10-CM

## 2024-04-10 DIAGNOSIS — R531 Weakness: Secondary | ICD-10-CM

## 2024-04-10 DIAGNOSIS — I10 Essential (primary) hypertension: Secondary | ICD-10-CM | POA: Diagnosis present

## 2024-04-10 DIAGNOSIS — I2510 Atherosclerotic heart disease of native coronary artery without angina pectoris: Secondary | ICD-10-CM | POA: Diagnosis present

## 2024-04-10 DIAGNOSIS — I959 Hypotension, unspecified: Secondary | ICD-10-CM

## 2024-04-10 DIAGNOSIS — E876 Hypokalemia: Secondary | ICD-10-CM | POA: Diagnosis present

## 2024-04-10 DIAGNOSIS — I5043 Acute on chronic combined systolic (congestive) and diastolic (congestive) heart failure: Secondary | ICD-10-CM | POA: Diagnosis present

## 2024-04-10 DIAGNOSIS — E7800 Pure hypercholesterolemia, unspecified: Secondary | ICD-10-CM | POA: Diagnosis present

## 2024-04-10 DIAGNOSIS — R6510 Systemic inflammatory response syndrome (SIRS) of non-infectious origin without acute organ dysfunction: Secondary | ICD-10-CM | POA: Diagnosis present

## 2024-04-10 DIAGNOSIS — N1831 Chronic kidney disease, stage 3a: Secondary | ICD-10-CM | POA: Diagnosis present

## 2024-04-10 DIAGNOSIS — R7401 Elevation of levels of liver transaminase levels: Secondary | ICD-10-CM | POA: Clinically undetermined

## 2024-04-10 LAB — CBC WITH DIFFERENTIAL/PLATELET
Abs Immature Granulocytes: 0.08 10*3/uL — ABNORMAL HIGH (ref 0.00–0.07)
Basophils Absolute: 0 10*3/uL (ref 0.0–0.1)
Basophils Relative: 1 %
Eosinophils Absolute: 0 10*3/uL (ref 0.0–0.5)
Eosinophils Relative: 0 %
HCT: 30.8 % — ABNORMAL LOW (ref 39.0–52.0)
Hemoglobin: 9.8 g/dL — ABNORMAL LOW (ref 13.0–17.0)
Immature Granulocytes: 1 %
Lymphocytes Relative: 9 %
Lymphs Abs: 0.8 10*3/uL (ref 0.7–4.0)
MCH: 29.3 pg (ref 26.0–34.0)
MCHC: 31.8 g/dL (ref 30.0–36.0)
MCV: 92.2 fL (ref 80.0–100.0)
Monocytes Absolute: 0.7 10*3/uL (ref 0.1–1.0)
Monocytes Relative: 9 %
Neutro Abs: 7 10*3/uL (ref 1.7–7.7)
Neutrophils Relative %: 80 %
Platelets: 327 10*3/uL (ref 150–400)
RBC: 3.34 MIL/uL — ABNORMAL LOW (ref 4.22–5.81)
RDW: 17.7 % — ABNORMAL HIGH (ref 11.5–15.5)
WBC: 8.6 10*3/uL (ref 4.0–10.5)
nRBC: 0 % (ref 0.0–0.2)

## 2024-04-10 LAB — URINALYSIS, ROUTINE W REFLEX MICROSCOPIC
Bilirubin Urine: NEGATIVE
Glucose, UA: NEGATIVE mg/dL
Hgb urine dipstick: NEGATIVE
Ketones, ur: NEGATIVE mg/dL
Leukocytes,Ua: NEGATIVE
Nitrite: NEGATIVE
Protein, ur: NEGATIVE mg/dL
Specific Gravity, Urine: 1.017 (ref 1.005–1.030)
pH: 5 (ref 5.0–8.0)

## 2024-04-10 LAB — AMMONIA: Ammonia: 26 umol/L (ref 9–35)

## 2024-04-10 LAB — VITAMIN B12: Vitamin B-12: 617 pg/mL (ref 180–914)

## 2024-04-10 LAB — URINALYSIS, W/ REFLEX TO CULTURE (INFECTION SUSPECTED)
Bacteria, UA: NONE SEEN
Bilirubin Urine: NEGATIVE
Glucose, UA: NEGATIVE mg/dL
Hgb urine dipstick: NEGATIVE
Ketones, ur: NEGATIVE mg/dL
Leukocytes,Ua: NEGATIVE
Nitrite: NEGATIVE
Protein, ur: NEGATIVE mg/dL
Specific Gravity, Urine: 1.017 (ref 1.005–1.030)
pH: 5 (ref 5.0–8.0)

## 2024-04-10 LAB — MAGNESIUM: Magnesium: 2 mg/dL (ref 1.7–2.4)

## 2024-04-10 LAB — COMPREHENSIVE METABOLIC PANEL WITH GFR
ALT: 58 U/L — ABNORMAL HIGH (ref 0–44)
AST: 195 U/L — ABNORMAL HIGH (ref 15–41)
Albumin: 3.3 g/dL — ABNORMAL LOW (ref 3.5–5.0)
Alkaline Phosphatase: 86 U/L (ref 38–126)
Anion gap: 16 — ABNORMAL HIGH (ref 5–15)
BUN: 35 mg/dL — ABNORMAL HIGH (ref 8–23)
CO2: 23 mmol/L (ref 22–32)
Calcium: 8.7 mg/dL — ABNORMAL LOW (ref 8.9–10.3)
Chloride: 98 mmol/L (ref 98–111)
Creatinine, Ser: 1.79 mg/dL — ABNORMAL HIGH (ref 0.61–1.24)
GFR, Estimated: 36 mL/min — ABNORMAL LOW
Glucose, Bld: 122 mg/dL — ABNORMAL HIGH (ref 70–99)
Potassium: 3.1 mmol/L — ABNORMAL LOW (ref 3.5–5.1)
Sodium: 137 mmol/L (ref 135–145)
Total Bilirubin: 0.5 mg/dL (ref 0.0–1.2)
Total Protein: 6.4 g/dL — ABNORMAL LOW (ref 6.5–8.1)

## 2024-04-10 LAB — RESP PANEL BY RT-PCR (RSV, FLU A&B, COVID)  RVPGX2
Influenza A by PCR: NEGATIVE
Influenza B by PCR: NEGATIVE
Resp Syncytial Virus by PCR: NEGATIVE
SARS Coronavirus 2 by RT PCR: NEGATIVE

## 2024-04-10 LAB — PHOSPHORUS: Phosphorus: 3.1 mg/dL (ref 2.5–4.6)

## 2024-04-10 LAB — IRON AND TIBC
Iron: 52 ug/dL (ref 45–182)
Saturation Ratios: 23 % (ref 17.9–39.5)
TIBC: 224 ug/dL — ABNORMAL LOW (ref 250–450)
UIBC: 172 ug/dL

## 2024-04-10 LAB — I-STAT CG4 LACTIC ACID, ED
Lactic Acid, Venous: 5.4 mmol/L (ref 0.5–1.9)
Lactic Acid, Venous: 1.8 mmol/L (ref 0.5–1.9)

## 2024-04-10 LAB — TSH: TSH: 1.31 u[IU]/mL (ref 0.350–4.500)

## 2024-04-10 LAB — APTT: aPTT: 31 s (ref 24–36)

## 2024-04-10 LAB — PROTIME-INR
INR: 1.2 (ref 0.8–1.2)
Prothrombin Time: 15.4 s — ABNORMAL HIGH (ref 11.4–15.2)

## 2024-04-10 LAB — PRO BRAIN NATRIURETIC PEPTIDE: Pro Brain Natriuretic Peptide: 626 pg/mL — ABNORMAL HIGH

## 2024-04-10 LAB — FOLATE: Folate: 11.9 ng/mL

## 2024-04-10 MED ORDER — SODIUM CHLORIDE 0.9 % IV SOLN
2.0000 g | Freq: Once | INTRAVENOUS | Status: AC
  Administered 2024-04-10: 2 g via INTRAVENOUS
  Filled 2024-04-10: qty 12.5

## 2024-04-10 MED ORDER — HYDROMORPHONE HCL 1 MG/ML IJ SOLN
0.5000 mg | INTRAMUSCULAR | Status: DC | PRN
  Administered 2024-04-10 – 2024-04-13 (×5): 1 mg via INTRAVENOUS
  Filled 2024-04-10 (×5): qty 1

## 2024-04-10 MED ORDER — SODIUM CHLORIDE 0.9 % IV SOLN
2.0000 g | Freq: Two times a day (BID) | INTRAVENOUS | Status: DC
  Administered 2024-04-10 – 2024-04-11 (×2): 2 g via INTRAVENOUS
  Filled 2024-04-10 (×3): qty 12.5

## 2024-04-10 MED ORDER — SODIUM CHLORIDE 0.9 % IV SOLN: 2.0000 g | Freq: Once | INTRAVENOUS | Status: DC

## 2024-04-10 MED ORDER — IPRATROPIUM BROMIDE 0.02 % IN SOLN: 0.5000 mg | Freq: Four times a day (QID) | RESPIRATORY_TRACT | Status: DC | PRN

## 2024-04-10 MED ORDER — OXYCODONE HCL 5 MG PO TABS
5.0000 mg | ORAL_TABLET | ORAL | Status: DC | PRN
  Administered 2024-04-11 – 2024-04-12 (×2): 5 mg via ORAL
  Filled 2024-04-10 (×2): qty 1

## 2024-04-10 MED ORDER — FLUOXETINE HCL 10 MG PO CAPS
10.0000 mg | ORAL_CAPSULE | Freq: Every day | ORAL | Status: DC
  Administered 2024-04-10 – 2024-04-14 (×5): 10 mg via ORAL
  Filled 2024-04-10 (×6): qty 1

## 2024-04-10 MED ORDER — ACETAMINOPHEN 325 MG PO TABS
650.0000 mg | ORAL_TABLET | Freq: Four times a day (QID) | ORAL | Status: DC | PRN
  Administered 2024-04-11 – 2024-04-13 (×3): 650 mg via ORAL
  Filled 2024-04-10 (×3): qty 2

## 2024-04-10 MED ORDER — LACTATED RINGERS IV BOLUS (SEPSIS)
500.0000 mL | Freq: Once | INTRAVENOUS | Status: AC
  Administered 2024-04-10: 500 mL via INTRAVENOUS

## 2024-04-10 MED ORDER — PANTOPRAZOLE SODIUM 40 MG PO TBEC
40.0000 mg | DELAYED_RELEASE_TABLET | Freq: Every day | ORAL | Status: DC
  Administered 2024-04-11 – 2024-04-15 (×5): 40 mg via ORAL
  Filled 2024-04-10 (×5): qty 1

## 2024-04-10 MED ORDER — FLUTICASONE PROPIONATE 50 MCG/ACT NA SUSP
2.0000 | Freq: Every day | NASAL | Status: DC
  Administered 2024-04-10 – 2024-04-15 (×6): 2 via NASAL
  Filled 2024-04-10: qty 16

## 2024-04-10 MED ORDER — HEPARIN SODIUM (PORCINE) 5000 UNIT/ML IJ SOLN: 5000.0000 [IU] | Freq: Three times a day (TID) | INTRAMUSCULAR | Status: DC

## 2024-04-10 MED ORDER — LACTATED RINGERS IV SOLN: INTRAVENOUS | Status: DC

## 2024-04-10 MED ORDER — SODIUM CHLORIDE 0.9% FLUSH
3.0000 mL | Freq: Two times a day (BID) | INTRAVENOUS | Status: DC
  Administered 2024-04-10 – 2024-04-15 (×11): 3 mL via INTRAVENOUS

## 2024-04-10 MED ORDER — POTASSIUM CHLORIDE CRYS ER 20 MEQ PO TBCR
40.0000 meq | EXTENDED_RELEASE_TABLET | Freq: Once | ORAL | Status: AC
  Administered 2024-04-10: 40 meq via ORAL
  Filled 2024-04-10: qty 2

## 2024-04-10 MED ORDER — VANCOMYCIN HCL 1250 MG/250ML IV SOLN
1250.0000 mg | Freq: Once | INTRAVENOUS | Status: AC
  Administered 2024-04-10: 1250 mg via INTRAVENOUS
  Filled 2024-04-10: qty 250

## 2024-04-10 MED ORDER — TAMSULOSIN HCL 0.4 MG PO CAPS
0.4000 mg | ORAL_CAPSULE | Freq: Every day | ORAL | Status: DC
  Administered 2024-04-11 – 2024-04-15 (×5): 0.4 mg via ORAL
  Filled 2024-04-10 (×5): qty 1

## 2024-04-10 MED ORDER — GUAIFENESIN 100 MG/5ML PO LIQD
15.0000 mL | Freq: Three times a day (TID) | ORAL | Status: DC
  Administered 2024-04-10 – 2024-04-15 (×15): 15 mL via ORAL
  Filled 2024-04-10 (×16): qty 20

## 2024-04-10 MED ORDER — HALOPERIDOL LACTATE 5 MG/ML IJ SOLN
2.0000 mg | Freq: Four times a day (QID) | INTRAMUSCULAR | Status: DC | PRN
  Administered 2024-04-11 – 2024-04-13 (×3): 2 mg via INTRAVENOUS
  Filled 2024-04-10 (×3): qty 1

## 2024-04-10 MED ORDER — METRONIDAZOLE 500 MG/100ML IV SOLN
500.0000 mg | Freq: Two times a day (BID) | INTRAVENOUS | Status: DC
  Administered 2024-04-10 – 2024-04-11 (×2): 500 mg via INTRAVENOUS
  Filled 2024-04-10 (×3): qty 100

## 2024-04-10 MED ORDER — LACTATED RINGERS IV BOLUS (SEPSIS): 1000.0000 mL | Freq: Once | INTRAVENOUS | Status: DC

## 2024-04-10 MED ORDER — MAGNESIUM SULFATE 2 GM/50ML IV SOLN
2.0000 g | Freq: Once | INTRAVENOUS | Status: AC
  Administered 2024-04-10: 2 g via INTRAVENOUS
  Filled 2024-04-10: qty 50

## 2024-04-10 MED ORDER — ASPIRIN 81 MG PO TBEC
81.0000 mg | DELAYED_RELEASE_TABLET | Freq: Every day | ORAL | Status: DC
  Administered 2024-04-11 – 2024-04-15 (×5): 81 mg via ORAL
  Filled 2024-04-10 (×5): qty 1

## 2024-04-10 MED ORDER — LORATADINE 10 MG PO TABS
10.0000 mg | ORAL_TABLET | Freq: Every day | ORAL | Status: DC
  Administered 2024-04-11 – 2024-04-15 (×5): 10 mg via ORAL
  Filled 2024-04-10 (×5): qty 1

## 2024-04-10 MED ORDER — NAPHAZOLINE-GLYCERIN 0.012-0.25 % OP SOLN
2.0000 [drp] | Freq: Four times a day (QID) | OPHTHALMIC | Status: DC
  Administered 2024-04-10 – 2024-04-15 (×21): 2 [drp] via OPHTHALMIC
  Filled 2024-04-10: qty 15

## 2024-04-10 MED ORDER — FLEET ENEMA RE ENEM: 1.0000 | ENEMA | Freq: Once | RECTAL | Status: DC | PRN

## 2024-04-10 MED ORDER — ONDANSETRON HCL 4 MG PO TABS: 4.0000 mg | ORAL_TABLET | Freq: Four times a day (QID) | ORAL | Status: DC | PRN

## 2024-04-10 MED ORDER — METRONIDAZOLE 500 MG/100ML IV SOLN
500.0000 mg | Freq: Once | INTRAVENOUS | Status: AC
  Administered 2024-04-10: 500 mg via INTRAVENOUS
  Filled 2024-04-10: qty 100

## 2024-04-10 MED ORDER — LACTATED RINGERS IV SOLN: 150.0000 mL/h | INTRAVENOUS | Status: DC

## 2024-04-10 MED ORDER — TRAZODONE HCL 50 MG PO TABS
25.0000 mg | ORAL_TABLET | Freq: Every evening | ORAL | Status: DC | PRN
  Administered 2024-04-10 – 2024-04-13 (×4): 25 mg via ORAL
  Filled 2024-04-10 (×4): qty 1

## 2024-04-10 MED ORDER — HYDRALAZINE HCL 20 MG/ML IJ SOLN: 10.0000 mg | INTRAMUSCULAR | Status: DC | PRN

## 2024-04-10 MED ORDER — SENNOSIDES-DOCUSATE SODIUM 8.6-50 MG PO TABS
1.0000 | ORAL_TABLET | Freq: Every evening | ORAL | Status: DC | PRN
  Administered 2024-04-14: 1 via ORAL
  Filled 2024-04-10: qty 1

## 2024-04-10 MED ORDER — VITAMIN D 25 MCG (1000 UNIT) PO TABS
2000.0000 [IU] | ORAL_TABLET | Freq: Every day | ORAL | Status: DC
  Administered 2024-04-11 – 2024-04-15 (×5): 2000 [IU] via ORAL
  Filled 2024-04-10 (×5): qty 2

## 2024-04-10 MED ORDER — VANCOMYCIN HCL IN DEXTROSE 1-5 GM/200ML-% IV SOLN: 1000.0000 mg | Freq: Once | INTRAVENOUS | Status: DC

## 2024-04-10 MED ORDER — ACETAMINOPHEN 650 MG RE SUPP: 650.0000 mg | Freq: Four times a day (QID) | RECTAL | Status: DC | PRN

## 2024-04-10 MED ORDER — FLORANEX PO PACK
1.0000 g | PACK | Freq: Three times a day (TID) | ORAL | Status: DC
  Administered 2024-04-10 – 2024-04-15 (×16): 1 g via ORAL
  Filled 2024-04-10 (×17): qty 1

## 2024-04-10 MED ORDER — NITROGLYCERIN 0.4 MG SL SUBL: 0.4000 mg | SUBLINGUAL_TABLET | SUBLINGUAL | Status: DC | PRN

## 2024-04-10 MED ORDER — AZELASTINE HCL 0.1 % NA SOLN
2.0000 | Freq: Two times a day (BID) | NASAL | Status: DC
  Administered 2024-04-10 – 2024-04-15 (×10): 2 via NASAL
  Filled 2024-04-10: qty 30

## 2024-04-10 MED ORDER — LACTATED RINGERS IV BOLUS (SEPSIS): 500.0000 mL | Freq: Once | INTRAVENOUS | Status: DC

## 2024-04-10 MED ORDER — ONDANSETRON HCL 4 MG/2ML IJ SOLN: 4.0000 mg | Freq: Four times a day (QID) | INTRAMUSCULAR | Status: DC | PRN

## 2024-04-10 MED ORDER — LORAZEPAM 2 MG/ML IJ SOLN
1.0000 mg | Freq: Once | INTRAMUSCULAR | Status: AC
  Administered 2024-04-10: 1 mg via INTRAVENOUS
  Filled 2024-04-10: qty 1

## 2024-04-10 MED ORDER — AMIODARONE HCL 200 MG PO TABS
100.0000 mg | ORAL_TABLET | Freq: Every day | ORAL | Status: DC
  Administered 2024-04-11 – 2024-04-15 (×5): 100 mg via ORAL
  Filled 2024-04-10 (×5): qty 1

## 2024-04-10 MED ORDER — VANCOMYCIN HCL 1500 MG/300ML IV SOLN: 1500.0000 mg | INTRAVENOUS | Status: DC

## 2024-04-10 MED ORDER — LACTATED RINGERS IV BOLUS
1000.0000 mL | Freq: Once | INTRAVENOUS | Status: AC
  Administered 2024-04-10: 1000 mL via INTRAVENOUS

## 2024-04-10 NOTE — ED Notes (Signed)
 Urine specimen not collected at this time. Pt is sleeping and family asked that we wait and attempt when he is awake

## 2024-04-10 NOTE — ED Notes (Signed)
 Pt is now awake and more cooperative. Pt able to provide urine specimen at this time.

## 2024-04-10 NOTE — Assessment & Plan Note (Signed)
 Follow up as outpatient

## 2024-04-10 NOTE — Sepsis Progress Note (Signed)
 Elink will follow per sepsis protocol.

## 2024-04-10 NOTE — ED Notes (Signed)
 Put was able to stand at the bedside with stand by assistance with nurse to use urinal. Pt states he needed to stand to be able to pee. Urine output 200cc.

## 2024-04-10 NOTE — Progress Notes (Signed)
 Patient arrived to the floor with personal belongings (clothing). Patient is alert & oriented x2: person and place. Patient is oriented to room with bed in lowest position, bed alarm on, and call bell near by.    04/10/24 1800  Vitals  Temp 98 F (36.7 C)  Temp Source Oral  BP 128/67  MAP (mmHg) 82  BP Location Right Arm  BP Method Automatic  Patient Position (if appropriate) Lying  Pulse Rate 73  Pulse Rate Source Monitor  ECG Heart Rate 75  Resp 19  MEWS COLOR  MEWS Score Color Green  Oxygen Therapy  SpO2 98 %  O2 Device Room Air  Pain Assessment  Pain Scale 0-10  Pain Score 0  MEWS Score  MEWS Temp 0  MEWS Systolic 0  MEWS Pulse 0  MEWS RR 0  MEWS LOC 0  MEWS Score 0

## 2024-04-10 NOTE — Progress Notes (Signed)
 Pt presented to [facility] with family concerns for AMS onset this AM. Not currently anticoagulated. He was recently admitted for influenza A and was dc'd to home on 1/13. Today family has noticed repetitive questioning, hand movements, etc. Which is different from his baseline. Otherwise nonfocal neuro exam per ED MD. 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 Recommend outpatient follow up in 4-6 weeks with repeat CTH.   Lindsi Bayliss CAYLIN Darlette Dubow, PA-C

## 2024-04-10 NOTE — Progress Notes (Signed)
 Pharmacy Antibiotic Note  Michael Bryant is a 89 y.o. male admitted on 04/10/2024 with sepsis.  Pharmacy has been consulted for cefepime /vancomycin  dosing.  Plan: Vancomycin  1250 mg IV x1 Vancomycin  1500mg  q48h (eAUC 460, Vd 0.72, Scr 1.79) Cefepime  2g IV q12h   Temp (24hrs), Avg:98.2 F (36.8 C), Min:98.2 F (36.8 C), Max:98.2 F (36.8 C)  Recent Labs  Lab 04/10/24 1007 04/10/24 1014  WBC 8.6  --   CREATININE 1.79*  --   LATICACIDVEN  --  5.4*    Estimated Creatinine Clearance: 29.5 mL/min (A) (by C-G formula based on SCr of 1.79 mg/dL (H)).    Allergies[1]  Antimicrobials this admission: flagyl  1/30 >>   Microbiology results: 1/30 BCx: pending 1/30 Sputum: pending   Thank you for allowing pharmacy to be a part of this patients care.  Michael Bryant, PharmD PGY1 Clinical Pharmacist [facility]  04/10/2024 12:47 PM     [1]  Allergies Allergen Reactions   Codeine Other (See Comments) and Hives    wont go to sleep

## 2024-04-10 NOTE — Assessment & Plan Note (Signed)
-   Elevated LFTs: AST 159, ALT 58, -May have been exacerbated by sepsis -Avoid hepatotoxic, holding statins -Continue IVF

## 2024-04-10 NOTE — Assessment & Plan Note (Signed)
 Once stable, obtaining PT OT for evaluation recommendation -Likely will need home health

## 2024-04-10 NOTE — ED Triage Notes (Signed)
 Pt BIB [facility] from home for AMS/hypotension. Per EMS, pt was eating breakfast and became altered per wife, initially pt agitated and Aox0, BP 60s sys, EMS gave 1L LR, last BP 108/40 and pt more alert, aox2 to person and location. All other VSS per EMS. Per family, pt normally Aox4 and uses a walker. Pt recently dx with COVID and a sinus infection.

## 2024-04-10 NOTE — Assessment & Plan Note (Signed)
 POA meet sepsis criteria, with hypotension, AKI, transaminitis, Lactic acid of 5.4 Indeterminant source of infection possible progressive advanced sinusitis -UA pending, - Status protocol initiated, IVF, broad-spectrum antibiotics - will narrow down antibiotics in next 24-48 hours according - Will follow the blood cultures, will try to obtain sputum cultures, UA if positive for signs of infection  Lactic acidosis 5.4 >>> Continue IV fluids, repeating lactic acid

## 2024-04-10 NOTE — Assessment & Plan Note (Addendum)
 With chronic iron  deficiency anemia -Outpatient iron  infusion - Monitoring hemoglobin, currently stable

## 2024-04-10 NOTE — ED Notes (Signed)
 Pt agitated pulling at IVs and tubing while rubbing his face.  Wife and daughter at bedside hold pts hands to help soothe him. Pt appears to be calmer with their touch while talking to him.

## 2024-04-10 NOTE — Assessment & Plan Note (Addendum)
 Hold on statin until follow up liver function testing as outpatient

## 2024-04-10 NOTE — Assessment & Plan Note (Signed)
Continue amiodarone. 

## 2024-04-10 NOTE — ED Notes (Signed)
 Pt sleeping and able to get some rest at this time agitation has decreased

## 2024-04-10 NOTE — Assessment & Plan Note (Addendum)
 Incidental findings CT head:  1. Low-density subdural collection along the left cerebral convexity measuring up to 9 mm with 2 mm left-to-right midline shift.  Per radiology seems to be old, appreciate neurosurgery evaluation and recommendations  No focal neurological finding  EDP Dr. Darra is reaching out to neurosurgery for evaluation Per neurosurgery:  chronic L SDH w/ 2mm MLS. Recommend outpatient follow up in 4-6 weeks with repeat CTH.

## 2024-04-10 NOTE — Assessment & Plan Note (Signed)
 replete as needed.

## 2024-04-10 NOTE — H&P (Addendum)
 " History and Physical   Patient: Michael Bryant                            PCP: Corlis Pagan, NP                    DOB: December 30, 1933            DOA: 04/10/2024 FMW:[id number]             DOS: 04/10/2024, 2:10 PM  Corlis Pagan, NP  Patient coming from:   HOME  I have personally reviewed patient's medical records, in electronic medical records, including:  [facility], and care everywhere.    Chief Complaint:   Chief Complaint  Patient presents with   Code Sepsis   Altered Mental Status    History of present illness:    Michael Bryant is a 89 year old male with extensive history of CAD, s/p CABG 2003, VF arrest in 2003, HTN, HLD, ? CHF, chronic iron  deficiency anemia, recent hospitalization for influenza A, hypoxia.  Was discharged 03/24/2024 ... Presented today with chief complaint of altered mental status, and hypotension. Per wife at breakfast seemed confused, and less responsive, and was complaining of generalized weaknesses.  Repetitive questioning, and hand movements touching his face and eyes. Patient was subsequently found hypotensive.   Reported tactile fever. No chills, no loss of consciousness.  Patient denies any active pain but is confused and has difficulty providing a complete history.  Wife and daughter present at bedside.    ED Evaluation: POA: Hypotension systolic in 80s, then 108 -subsequent improved with IVF Blood pressure (!) 118/56, pulse 71, temperature 98.2 F (36.8 C), temperature source Oral, resp. rate 17, SpO2 97%. LABs: WBC 8.6, hemoglobin 9.8, potassium 3.1, BUN 35, creatinine 1.79, calcium 8.7, lactic acid 5.4 Influenza A, B, SARS-CoV-2, RSV-all negative  CT head:  1. Low-density subdural collection along the left cerebral convexity measuring up to 9 mm with 2 mm left-to-right midline shift. 2. Complete opacification of the bilateral maxillary sinuses, frontal sinuses, and anterior ethmoid air cells. 3. Generalized atrophy and white matter disease,  stable. 4. Remote lacunar infarcts in the cerebellum bilaterally, left greater than right.  Patient met sepsis criteria, started on IV fluids, IV antibiotics, cultures were obtained  EDP Dr. Darra in discussion regarding incidental finding of low-density subdural collection which may be chronic   Patient Denies having: Fever, Chills, Cough, SOB, Chest Pain, Abd pain, N/V/D, headache, dizziness, lightheadedness,  Dysuria, Joint pain, rash, open wounds    Review of Systems: As per HPI, otherwise 10 point review of systems were negative.   ----------------------------------------------------------------------------------------------------------------------  Allergies[1]  Home MEDs:  Prior to Admission medications  Medication Sig Start Date End Date Taking? Authorizing Provider  amiodarone  (PACERONE ) 200 MG tablet Take 0.5 tablets (100 mg total) by mouth daily. 06/03/23  Yes Croitoru, Mihai, MD  aspirin  EC 81 MG tablet Take 81 mg by mouth daily.   Yes [provider]  azelastine  (ASTELIN ) 0.1 % nasal spray Place 2 sprays into both nostrils 2 (two) times daily. Use in each nostril as directed 12/25/23  Yes Hedges, Reyes, PA-C  beta carotene w/minerals (OCUVITE) tablet Take 1 tablet by mouth daily.   Yes [provider]  BISACODYL 5 MG EC tablet Take 5 mg by mouth daily as needed for moderate constipation. 03/11/24  Yes [provider]  Cholecalciferol  (VITAMIN D ) 50 MCG (2000 UT) CAPS Take  2,000 Units by mouth daily.   Yes [provider]  CINNAMON PO Take 1 tablet by mouth daily.    Yes [provider]  Diclofenac  Sodium CR 100 MG 24 hr tablet Take 100 mg by mouth daily as needed for pain.   Yes [provider]  fish oil-omega-3 fatty acids 1000 MG capsule Take 1 g by mouth daily.    Yes [provider]  FLUoxetine  (PROZAC ) 10 MG capsule Take 10 mg by mouth at bedtime. 03/11/24  Yes [provider]  furosemide  (LASIX )  40 MG tablet Take 1 tablet (40 mg total) by mouth daily as needed for fluid or edema. 03/27/24 03/27/25 Yes Kathrin Mignon DASEN, MD  GELATIN PO Take 9 g by mouth daily.   Yes [provider]  Glucosamine 500 MG CAPS Take 1 capsule by mouth daily.   Yes [provider]  loratadine  (CLARITIN ) 10 MG tablet Take 1 tablet (10 mg total) by mouth daily. 03/24/24  Yes Gonfa, Taye T, MD  Misc Natural Products (PROSTATE HEALTH) CAPS Take 1 capsule by mouth daily.   Yes [provider]  mometasone  (NASONEX ) 50 MCG/ACT nasal spray Place 2 sprays into the nose daily. Patient taking differently: Place 2 sprays into the nose as needed. 11/15/23  Yes Hedges, Reyes, PA-C  nitroGLYCERIN  (NITROSTAT ) 0.4 MG SL tablet Place 1 tablet (0.4 mg total) under the tongue every 5 (five) minutes as needed for chest pain. 02/27/21  Yes Croitoru, Mihai, MD  omeprazole (PRILOSEC) 10 MG capsule Take 10 mg by mouth daily.   Yes [provider]  Polyethyl Glycol-Propyl Glycol (SYSTANE OP) Place 1 drop into both eyes daily as needed (dry eyes).   Yes [provider]  simvastatin  (ZOCOR ) 20 MG tablet TAKE 1 TABLET(20 MG) BY MOUTH DAILY AT 6 PM 04/02/24  Yes Croitoru, Mihai, MD  sodium chloride  (OCEAN) 0.65 % SOLN nasal spray Place 2 sprays into both nostrils every 8 (eight) hours. 03/24/24  Yes Gonfa, Taye T, MD  tamsulosin  (FLOMAX ) 0.4 MG CAPS capsule Take 0.4 mg by mouth daily. 03/02/13  Yes [provider]  valsartan  (DIOVAN ) 320 MG tablet Take 1 tablet (320 mg total) by mouth daily. 03/24/24  Yes Gonfa, Taye T, MD    PRN MEDs: acetaminophen  **OR** acetaminophen , haloperidol  lactate, hydrALAZINE , HYDROmorphone  (DILAUDID ) injection, ipratropium, nitroGLYCERIN , ondansetron  **OR** ondansetron  (ZOFRAN ) IV, oxyCODONE , senna-docusate, traZODone   Past Medical History:  Diagnosis Date   CAD (coronary artery disease)    VF arrest >>  s/p CABG (2003) // Myoview  1/15: no ischemia // Cath 4/15: all  grafts patent   HTN (hypertension)    Hx of VF arrest in 2003 2003   Hyperlipidemia    NSVT (nonsustained ventricular tachycardia) (HCC) 06/16/2013   Rx with amiodarone  // Echo 6/17: EF 55-60, Gr 1 DD, apical HK    Past Surgical History:  Procedure Laterality Date   CARDIAC CATHETERIZATION  12/15/2001   60% proximal LAD narrowing with active mobile thrombosis w/50% sequential areas,50-60% pro   CORONARY ARTERY BYPASS GRAFT  2004   LIMA to LAD, SVG to OM, SVG to RCA   LEFT HEART CATHETERIZATION WITH CORONARY/GRAFT ANGIOGRAM N/A 07/02/2013   Procedure: LEFT HEART CATHETERIZATION WITH EL BILE;  Surgeon: Jerel Balding, MD;  Location: [facility] CATH [facility];  Service: Cardiovascular;  Laterality: N/A;   NM MYOCAR PERF WALL MOTION  12/09/2009   mod. perfusion defect basal inferior and mid inferior regions c/w  infarct/scar, EF 54%   TONSILLECTOMY AND ADENOIDECTOMY  reports that he has quit smoking. His smoking use included cigarettes. He has quit using smokeless tobacco. He reports that he does not drink alcohol and does not use drugs.   Family History  Problem Relation Age of Onset   Cancer Mother 65   CAD Father 33       Died age 62   Heart disease Father     Physical Exam:   Vitals:   04/10/24 0951 04/10/24 1106  BP: (!) 108/91 (!) 118/56  Pulse: (!) 58 71  Resp: 12 17  Temp: 98.2 F (36.8 C)   TempSrc: Oral   SpO2: 99% 97%   Constitutional: NAD, calm, comfortable Eyes: PERRL, lids and conjunctivae normal ENMT: Mucous membranes are moist. Posterior pharynx clear of any exudate or lesions.Normal dentition.  Neck: normal, supple, no masses, no thyromegaly Respiratory: clear to auscultation bilaterally, no wheezing, no crackles. Normal respiratory effort. No accessory muscle use.  Cardiovascular: Regular rate and rhythm, no murmurs / rubs / gallops. No extremity edema. 2+ pedal pulses. No carotid bruits.  Abdomen: no tenderness, no masses palpated. No  hepatosplenomegaly. Bowel sounds positive.  Musculoskeletal: no clubbing / cyanosis. No joint deformity upper and lower extremities. Good ROM, no contractures. Normal muscle tone.  Neurologic: CN II-XII grossly intact. Sensation intact, DTR normal. Strength 5/5 in all 4.  Psychiatric: Normal judgment and insight. Alert and oriented x 3. Normal mood.  Skin: no rashes, lesions, ulcers. No induration          Labs on admission:    I have personally reviewed following labs and imaging studies  CBC: Recent Labs  Lab 04/10/24 1007  WBC 8.6  NEUTROABS 7.0  HGB 9.8*  HCT 30.8*  MCV 92.2  PLT 327   Basic Metabolic Panel: Recent Labs  Lab 04/10/24 1007  NA 137  K 3.1*  CL 98  CO2 23  GLUCOSE 122*  BUN 35*  CREATININE 1.79*  CALCIUM 8.7*   GFR: Estimated Creatinine Clearance: 29.5 mL/min (A) (by C-G formula based on SCr of 1.79 mg/dL (H)). Liver Function Tests: Recent Labs  Lab 04/10/24 1007  AST 195*  ALT 58*  ALKPHOS 86  BILITOT 0.5  PROT 6.4*  ALBUMIN 3.3*   No results for input(s): LIPASE, AMYLASE in the last 168 hours. No results for input(s): AMMONIA in the last 168 hours. Coagulation Profile: Recent Labs  Lab 04/10/24 1100  INR 1.2   Cardiac Enzymes: No results for input(s): CKTOTAL, CKMB, CKMBINDEX, TROPONINI in the last 168 hours. BNP (last 3 results) Recent Labs    02/27/24 0826 03/21/24 1433  PROBNP 621.0* 1,667.0*    Urine analysis:    Component Value Date/Time   COLORURINE YELLOW 02/27/2024 0920   APPEARANCEUR CLEAR 02/27/2024 0920   LABSPEC 1.029 02/27/2024 0920   PHURINE 5.5 02/27/2024 0920   GLUCOSEU NEGATIVE 02/27/2024 0920   HGBUR NEGATIVE 02/27/2024 0920   BILIRUBINUR NEGATIVE 02/27/2024 0920   KETONESUR TRACE (A) 02/27/2024 0920   PROTEINUR 30 (A) 02/27/2024 0920   NITRITE NEGATIVE 02/27/2024 0920   LEUKOCYTESUR NEGATIVE 02/27/2024 0920    Last A1C:  Lab Results  Component Value Date   HGBA1C (H)  07/08/2009    5.8 (NOTE)  According to the ADA Clinical Practice Recommendations for 2011, when HbA1c is used as a screening test:   >=6.5%   Diagnostic of Diabetes Mellitus           (if abnormal result  is confirmed)  5.7-6.4%   Increased risk of developing Diabetes Mellitus  References:Diagnosis and Classification of Diabetes Mellitus,Diabetes Care,2011,34(Suppl 1):S62-S69 and Standards of Medical Care in         Diabetes - 2011,Diabetes Care,2011,34  (Suppl 1):S11-S61.     Radiologic Exams on Admission:   CT Head Wo Contrast Result Date: 04/10/2024 EXAM: CT HEAD WITHOUT CONTRAST 04/10/2024 11:28:24 AM TECHNIQUE: CT of the head was performed without the administration of intravenous contrast. Automated exposure control, iterative reconstruction, and/or weight based adjustment of the mA/kV was utilized to reduce the radiation dose to as low as reasonably achievable. COMPARISON: 11/24/2023 CLINICAL HISTORY: Mental status change, unknown cause. FINDINGS: BRAIN AND VENTRICLES: No acute hemorrhage. Low-density subdural collection along the left cerebral convexity measuring up to 9 mm. 2 mm left-to-right midline shift. No evidence of acute infarct. Remote lacunar infarcts are again noted in the cerebellum bilaterally, left greater than right. Generalized atrophy and white matter disease is stable. No hydrocephalus. ORBITS: Bilateral lens replacement. No acute abnormality. SINUSES: Complete opacification of bilateral maxillary sinuses, frontal sinuses, anterior ethmoid air cells. SOFT TISSUES AND SKULL: No acute soft tissue abnormality. No skull fracture. IMPRESSION: 1. Low-density subdural collection along the left cerebral convexity measuring up to 9 mm with 2 mm left-to-right midline shift. 2. Complete opacification of the bilateral maxillary sinuses, frontal sinuses, and anterior ethmoid air cells. 3. Generalized atrophy and white matter  disease, stable. 4. Remote lacunar infarcts in the cerebellum bilaterally, left greater than right. Electronically signed by: Lonni Necessary MD 04/10/2024 11:44 AM EST RP Workstation: [id number]   DG Chest Port 1 View Result Date: 04/10/2024 EXAM: 1 VIEW(S) XRAY OF THE CHEST 04/10/2024 10:02:00 AM COMPARISON: 03/21/2024 CLINICAL HISTORY: Questionable sepsis; evaluate for abnormality. FINDINGS: LUNGS AND PLEURA: Low lung volumes. No focal pulmonary opacity. No pleural effusion. No pneumothorax. HEART AND MEDIASTINUM: Aortic calcification. Cardiac loop recorder in place. No acute abnormality of the cardiac and mediastinal silhouettes. BONES AND SOFT TISSUES: Sternotomy and CABG noted. No acute osseous abnormality. IMPRESSION: 1. No acute cardiopulmonary abnormality. 2. Low lung volumes. 3. Status post sternotomy and CABG. 4. Cardiac loop recorder. 5. Aortic calcification. Electronically signed by: Ryan Salvage MD 04/10/2024 10:21 AM EST RP Workstation: [id number]    EKG:   Independently reviewed.  Orders placed or performed during the hospital encounter of 04/10/24   EKG 12-Lead   EKG 12-Lead   ED EKG   ED EKG   EKG 12-Lead   ---------------------------------------------------------------------------------------------------------------------------------------    Assessment / Plan:   Principal Problem:   Sepsis (HCC) Active Problems:   Essential hypertension   Chronic sinusitis   SDH (subdural hematoma) (HCC)   NSVT (nonsustained ventricular tachycardia) (HCC)   Hypercholesterolemia   Weakness   CKD stage 3a, GFR 45-59 ml/min (HCC)   Acute on chronic combined systolic and diastolic CHF (congestive heart failure) (HCC)   CAD (coronary artery disease), hx CABG 2003   Normocytic anemia   Transaminitis   Hypokalemia   Assessment and Plan: * Sepsis (HCC) POA meet sepsis criteria, with hypotension, AKI, transaminitis, Lactic acid of 5.4 Indeterminant source of infection  possible progressive advanced sinusitis -UA pending, - Status protocol initiated, IVF, broad-spectrum antibiotics - will narrow down antibiotics in next 24-48 hours according - Will follow  the blood cultures, will try to obtain sputum cultures, UA if positive for signs of infection  Lactic acidosis 5.4 >>> Continue IV fluids, repeating lactic acid   SDH (subdural hematoma) (HCC) Incidental findings CT head:  1. Low-density subdural collection along the left cerebral convexity measuring up to 9 mm with 2 mm left-to-right midline shift.  Per radiology seems to be old, appreciate neurosurgery evaluation and recommendations  No focal neurological finding  EDP Dr. Darra is reaching out to neurosurgery for evaluation Per neurosurgery:  chronic L SDH w/ 2mm MLS. Recommend outpatient follow up in 4-6 weeks with repeat CTH.   Chronic sinusitis Acute on chronic sinusitis, -CT of the head reviewed Complete opacification of the bilateral maxillary sinuses, frontal sinuses, and anterior ethmoid air cells.  - Continue Mucinex , nasal sprays  - Continue -broad-spectrum antibiotics for now  Essential hypertension POA hypotensive: Blood pressure stable now after IV fluid -Holding home BP meds including valsartan   Acute on chronic combined systolic and diastolic CHF (congestive heart failure) (HCC) No signs of exacerbation - March 22, 2024 EF 55-60%, normal LV function, normal RV function, no evidence of valvular regurgitation or deformities, negative for any LVH, diastolic parameters were normal - As needed Lasix  listed in med rec -Will monitor I's and O's, daily weight -Patient needs aggressive IV fluid resuscitation as patient meets sepsis criteria  CKD stage 3a, GFR 45-59 ml/min (HCC) Acute on chronic kidney disease -worsening renal function Lab Results  Component Value Date   CREATININE 1.79 (H) 04/10/2024   CREATININE 0.92 03/24/2024   CREATININE 1.00 03/23/2024  - BUN 35, GFR  36 - Elevated BUN/creatinine from baseline - Avoiding nephrotoxins, avoiding hypotension - Monitor kidney function closely   Weakness Once stable, obtaining PT OT for evaluation recommendation -Likely will need home health  Hypercholesterolemia - Holding simvastatin  - Not on hepatotoxins due to transaminitis    NSVT (nonsustained ventricular tachycardia) (HCC) Monitoring heart rate closely -Continue amiodarone  -Maintaining K >4  , Magnesium > 2.0  Normocytic anemia With chronic iron  deficiency anemia -Outpatient iron  infusion - Monitoring hemoglobin, currently stable   CAD (coronary artery disease), hx CABG 2003 Denies any chest pain, resuming aspirin , holding statins due to transaminitis for now  Hypokalemia Serum potassium 3.1, checking serum magnesium  level -Replating both accordingly  Transaminitis - Elevated LFTs: AST 159, ALT 58, -May have been exacerbated by sepsis -Avoid hepatotoxic, holding statins -Continue IVF   Colonic mass:  -Per [facility], colonoscopy 03/18/2024 with ascending colon mass.   Recent EGD revealing gastritis, to follow-up with the final pathology - Per daughter follow-up colonoscopy, biopsy were all benign - Dr. Bronson -to follow-up closely as outpatient   Encephalopathy, with confusion, agitation Patient has agitation with mild confusion, touching his eyes and face excessively - Administering 1 dose Ativan    Consults called: Neurosurgery -------------------------------------------------------------------------------------------------------------------------------------------- DVT prophylaxis:  SCDs Start: 04/10/24 1212 Place TED hose Start: 04/10/24 1212   Code Status:   Code Status: Full Code   Admission status: Patient will be admitted as Inpatient, with a greater than 2 midnight length of stay. Level of care: Telemetry   Family Communication:  none at bedside  (The above findings and plan of care has been discussed with  patient in detail, the patient expressed understanding and agreement of above plan)  --------------------------------------------------------------------------------------------------------------------------------------------------  Disposition Plan:  Anticipated 1-2 days Status is: Inpatient Remains inpatient appropriate because: Meeting sepsis criteria, needing IV antibiotics, IV fluids,     ----------------------------------------------------------------------------------------------------------------------------------------------------  Time spent:  24  Min.  Was spent seeing and evaluating the patient, reviewing all medical records, drawn plan of care.  SIGNED: Adriana DELENA Grams, MD, FHM. FAAFP. [place] [facility], Pager  (Please use [email or web address] to page/ or secure chat through epic) If 7PM-7AM, please contact night-coverage [email or web address],  04/10/2024, 2:10 PM     [1]  Allergies Allergen Reactions   Codeine Other (See Comments) and Hives    wont go to sleep   "

## 2024-04-10 NOTE — Hospital Course (Addendum)
 Michael Bryant was admitted to the hospital with the working diagnosis of acute metabolic encephalopathy.   89 year old male with extensive history of CAD, s/p CABG 2003, VF arrest in 2003, HTN, HLD, heart failure and chronic iron  deficiency anemia who presented with altered mental status.  01/10 to 03/24/24 Recent hospitalization for influenza A, complicated with hypoxia.  Per wife at breakfast seemed confused, and less responsive, and was complaining of generalized weaknesses.  Repetitive questioning, and hand movements touching his face and eyes. EMS was called, he was found hypotensive, with systolic blood pressure in the 60's, he received 1 L IV  fluids and was transported to the ED.  On his initial physical examination his blood pressure was 118/56, HR 71, rr 17 AND 02 Saturation 97% Lungs with no wheezing or rhonchi, heart with S1 and S2 present and regular, with no gallops or rubs, abdomen with no distention and no lower extremity edema.   Na 137, K 3.1 Cl 98 bicarbonate 23 glucose 122, bun 35 cr 1,79 Mg 2.0- AST 195, ALT 58 Total protein 6,4  Pron BNP 626 Wbc 8,6 hgb 9,8 plt 327  Sars COVID 19 negative Influenza negative RSV negative  Urine analysis SG 1,017, negative leukocytes, negative hgb, negative protein   Chest radiograph with hypoinflation, with no cardiomegaly, no effusions or infiltrates. Sternotomy wires in place  EKG 65 bpm, normal axis, normal intervals, qtc 450, sinus rhythm with left atrial enlargement, 1st degree AV block, sinus rhythm with no significant ST segment or T wave changes.   CT head with low density subdural collection along the left cerebral convexity measuring up to 9 mm with 2 mm left to right midline shift Complete opacification of the bilateral maxillary sinuses, frontal sinuses and anterior ethmoid air cells.  Generalized atrophy and white matter disease, stable.  Remote lacunar infarcts in the cerebellum bilaterally, left greater than right,    Patient initially placed on IV fluids and IV antibiotics Neurosurgery was consulted with recommendations for conservative management.  Infection/ sepsis was ruled out.  Renal function improved. Patient was evaluated by PT and OT and recommendations to continue therapy at SNF.  02/04 patient medically stable, plan for transfer to SNF, will need close follow up.

## 2024-04-11 DIAGNOSIS — J329 Chronic sinusitis, unspecified: Secondary | ICD-10-CM | POA: Diagnosis not present

## 2024-04-11 DIAGNOSIS — R6510 Systemic inflammatory response syndrome (SIRS) of non-infectious origin without acute organ dysfunction: Secondary | ICD-10-CM

## 2024-04-11 DIAGNOSIS — R4189 Other symptoms and signs involving cognitive functions and awareness: Secondary | ICD-10-CM | POA: Diagnosis not present

## 2024-04-11 DIAGNOSIS — S065XAA Traumatic subdural hemorrhage with loss of consciousness status unknown, initial encounter: Secondary | ICD-10-CM | POA: Diagnosis not present

## 2024-04-11 LAB — CBC
HCT: 28 % — ABNORMAL LOW (ref 39.0–52.0)
Hemoglobin: 9 g/dL — ABNORMAL LOW (ref 13.0–17.0)
MCH: 29.6 pg (ref 26.0–34.0)
MCHC: 32.1 g/dL (ref 30.0–36.0)
MCV: 92.1 fL (ref 80.0–100.0)
Platelets: 264 10*3/uL (ref 150–400)
RBC: 3.04 MIL/uL — ABNORMAL LOW (ref 4.22–5.81)
RDW: 17.7 % — ABNORMAL HIGH (ref 11.5–15.5)
WBC: 6.8 10*3/uL (ref 4.0–10.5)
nRBC: 0 % (ref 0.0–0.2)

## 2024-04-11 LAB — COMPREHENSIVE METABOLIC PANEL WITH GFR
ALT: 45 U/L — ABNORMAL HIGH (ref 0–44)
AST: 121 U/L — ABNORMAL HIGH (ref 15–41)
Albumin: 2.8 g/dL — ABNORMAL LOW (ref 3.5–5.0)
Alkaline Phosphatase: 72 U/L (ref 38–126)
Anion gap: 9 (ref 5–15)
BUN: 25 mg/dL — ABNORMAL HIGH (ref 8–23)
CO2: 25 mmol/L (ref 22–32)
Calcium: 8.3 mg/dL — ABNORMAL LOW (ref 8.9–10.3)
Chloride: 101 mmol/L (ref 98–111)
Creatinine, Ser: 1.16 mg/dL (ref 0.61–1.24)
GFR, Estimated: >60 mL/min
Glucose, Bld: 89 mg/dL (ref 70–99)
Potassium: 3.7 mmol/L (ref 3.5–5.1)
Sodium: 135 mmol/L (ref 135–145)
Total Bilirubin: 0.5 mg/dL (ref 0.0–1.2)
Total Protein: 5.4 g/dL — ABNORMAL LOW (ref 6.5–8.1)

## 2024-04-11 LAB — GLUCOSE, CAPILLARY: Glucose-Capillary: 108 mg/dL — ABNORMAL HIGH (ref 70–99)

## 2024-04-11 MED ORDER — ENSURE PLUS HIGH PROTEIN PO LIQD
237.0000 mL | Freq: Two times a day (BID) | ORAL | Status: DC
  Administered 2024-04-11 – 2024-04-15 (×8): 237 mL via ORAL

## 2024-04-11 MED ORDER — SODIUM CHLORIDE 0.9 % IV SOLN
3.0000 g | Freq: Four times a day (QID) | INTRAVENOUS | Status: DC
  Administered 2024-04-11 – 2024-04-13 (×8): 3 g via INTRAVENOUS
  Filled 2024-04-11 (×8): qty 8

## 2024-04-11 NOTE — Progress Notes (Signed)
 Occupational Therapy Evaluation Patient Details Name: Michael Bryant MRN: [id number] DOB: [birth year] Today's Date: 04/11/2024   History of Present Illness   89 year old male presented 04/10/24 with  with chief complaint of altered mental status, and hypotension. +sepsis; head CT incidental finding L SDH - no surgical indication per neurosurgery; PMH- CAD, s/p CABG 2003, VF arrest in 2003, HTN, HLD, ? CHF, chronic iron  deficiency anemia, recent hospitalization for influenza A, hypoxia.  Was discharged 03/24/2024     Clinical Impressions Pt admitted with above. He demonstrates the below listed deficits and will benefit from continued OT to maximize safety and independence with BADLs.  Pt presents with the following impairments: balance, ? Mild cognitive deficits (scored 6/28 on the Short Blessed Test), decreased activity tolerance, ADLs, and functional mobility.  PTA, pt lived with wife and was independent with ADLs, and mobility without use of AD.  He does not drive due to macular degeneration. Do not anticipate he will need OT follow up at discharge.       If plan is discharge home, recommend the following:   A little help with walking and/or transfers;A little help with bathing/dressing/bathroom;Direct supervision/assist for medications management;Assist for transportation;Assistance with cooking/housework     Functional Status Assessment   Patient has had a recent decline in their functional status and demonstrates the ability to make significant improvements in function in a reasonable and predictable amount of time.     Equipment Recommendations   Tub/shower bench     Recommendations for Other Services         Precautions/Restrictions   Precautions Precautions: Fall Restrictions Weight Bearing Restrictions Per Provider Order: No     Mobility Bed Mobility Overal bed mobility: Modified Independent                  Transfers Overall transfer level: Needs  assistance Equipment used: Rolling walker (2 wheels) Transfers: Sit to/from Stand, Bed to chair/wheelchair/BSC Sit to Stand: Min assist     Step pivot transfers: Min assist     General transfer comment: mildly unsteady      Balance Overall balance assessment: Needs assistance Sitting-balance support: Feet supported Sitting balance-Leahy Scale: Good     Standing balance support: During functional activity Standing balance-Leahy Scale: Poor Standing balance comment: min A for dynamic standing                           ADL either performed or assessed with clinical judgement   ADL Overall ADL's : Needs assistance/impaired Eating/Feeding: Independent   Grooming: Wash/dry hands;Wash/dry face;Oral care;Minimal assistance;Standing   Upper Body Bathing: Supervision/ safety;Set up;Sitting   Lower Body Bathing: Minimal assistance;Sit to/from stand   Upper Body Dressing : Set up;Supervision/safety;Sitting   Lower Body Dressing: Minimal assistance;Sit to/from stand   Toilet Transfer: Minimal assistance;Ambulation;Comfort height toilet   Toileting- Clothing Manipulation and Hygiene: Minimal assistance;Sit to/from stand       Functional mobility during ADLs: Minimal assistance;Cueing for safety       Vision Baseline Vision/History: 6 Macular Degeneration Ability to See in Adequate Light: 1 Impaired Patient Visual Report: No change from baseline;Central vision impairment (h/o macular degeneration.  No change.  Pt able to read clock on wall with increased effort.  Does not wear glasses)       Perception         Praxis         Pertinent Vitals/Pain Pain Assessment Pain Assessment: No/denies pain  Extremity/Trunk Assessment Upper Extremity Assessment Upper Extremity Assessment: Overall WFL for tasks assessed   Lower Extremity Assessment Lower Extremity Assessment: Overall WFL for tasks assessed   Cervical / Trunk Assessment Cervical / Trunk  Assessment: Normal   Communication Communication Communication: No apparent difficulties   Cognition Arousal: Alert Behavior During Therapy: WFL for tasks assessed/performed               OT - Cognition Comments: The short blessed test was administered with pt scoring 6/28 which is indicative of ? cognitive deficit. He reports he does not feel he is at baseline.  He does appear to have questionable intermittent confusion noted during conversation                 Following commands: Intact       Cueing  General Comments   Cueing Techniques: Verbal cues  VSS throughout session.  Pt on RA   Exercises     Shoulder Instructions      Home Living Family/patient expects to be discharged to:: Private residence Living Arrangements: Spouse/significant other Available Help at Discharge: Family Type of Home: House Home Access: Stairs to [employer] entry Entrance Stairs-Number of Steps: 2, no steps at back Entrance Stairs-Rails: None Home Layout: One level     Bathroom Shower/Tub: Chief Strategy Officer: Standard     Home Equipment: Cane - single Librarian, Academic (2 wheels)          Prior Functioning/Environment Prior Level of Function : Independent/Modified Independent             Mobility Comments: no AD, denies falls ADLs Comments: Indep with ADLs, IADLs, spouse drives as pt has macular degeneration    OT Problem List: Decreased activity tolerance;Impaired balance (sitting and/or standing);Impaired vision/perception;Decreased cognition;Decreased safety awareness;Decreased knowledge of use of DME or AE   OT Treatment/Interventions: Self-care/ADL training;Therapeutic exercise;DME and/or AE instruction;Therapeutic activities;Cognitive remediation/compensation;Balance training;Patient/family education      OT Goals(Current goals can be found in the care plan section)   Acute Rehab OT Goals Patient Stated Goal: to take a nap OT Goal  Formulation: With patient Time For Goal Achievement: 04/23/24 Potential to Achieve Goals: Good   OT Frequency:  Min 2X/week    Co-evaluation              AM-PAC OT 6 Clicks Daily Activity     Outcome Measure Help from another person eating meals?: None Help from another person taking care of personal grooming?: A Little Help from another person toileting, which includes using toliet, bedpan, or urinal?: A Little Help from another person bathing (including washing, rinsing, drying)?: A Little Help from another person to put on and taking off regular upper body clothing?: A Little Help from another person to put on and taking off regular lower body clothing?: A Little 6 Click Score: 19   End of Session Equipment Utilized During Treatment: Gait belt;Rolling walker (2 wheels) (RW utilized initially, then no AD as pt does not utilize AD at baseline) Nurse Communication: Mobility status  Activity Tolerance: Patient tolerated treatment well Patient left: in bed;with call bell/phone within reach;with bed alarm set  OT Visit Diagnosis: Unsteadiness on feet (R26.81);Other symptoms and signs involving cognitive function                Time: 9072-9048 OT Time Calculation (min): 24 min Charges:  OT General Charges $OT Visit: 1 Visit OT Evaluation $OT Eval Moderate Complexity: 1 Mod OT Treatments $Self Care/Home Management :  8-22 mins  Angeline BROCKS., OTR/L Acute Rehabilitation Services Office [phone]   Angeline M Renlee Floor 04/11/2024, 10:09 AM

## 2024-04-11 NOTE — Progress Notes (Signed)
 " Progress Note    Michael Bryant   FMW:[id number]  DOB: 23-Nov-1933  DOA: 04/10/2024     1 PCP: Corlis Pagan, NP  Initial CC: AMS, hypotension   Hospital Course: Michael Bryant is a 89 year old male with extensive history of CAD, s/p CABG 2003, VF arrest in 2003, HTN, HLD, ? CHF, chronic iron  deficiency anemia, recent hospitalization for influenza A, hypoxia.  Was discharged 03/24/2024. Presented with chief complaint of altered mental status, and hypotension. Per wife at breakfast seemed confused, and less responsive, and was complaining of generalized weaknesses.  Repetitive questioning, and hand movements touching his face and eyes. Patient was subsequently found hypotensive.   Reported tactile fever. No chills, no loss of consciousness.  Patient denies any active pain but is confused and has difficulty providing a complete history.  Wife and daughter present at bedside on admission.   ED Evaluation: POA: Hypotension systolic in 80s, then 108 -subsequent improved with IVF Blood pressure (!) 118/56, pulse 71, temperature 98.2 F (36.8 C), temperature source Oral, resp. rate 17, SpO2 97%. LABs: WBC 8.6, hemoglobin 9.8, potassium 3.1, BUN 35, creatinine 1.79, calcium 8.7, lactic acid 5.4 Influenza A, B, SARS-CoV-2, RSV-all negative  CT head:  1. Low-density subdural collection along the left cerebral convexity measuring up to 9 mm with 2 mm left-to-right midline shift. 2. Complete opacification of the bilateral maxillary sinuses, frontal sinuses, and anterior ethmoid air cells. 3. Generalized atrophy and white matter disease, stable. 4. Remote lacunar infarcts in the cerebellum bilaterally, left greater than right.  Patient met sepsis criteria, started on IV fluids, IV antibiotics, cultures were obtained  Interval History:  Seems to be doing a little bit better this morning.  Denies any dizziness or lightheadedness.  Still a little disoriented this morning.  Assessment and Plan: *  SIRS (systemic inflammatory response syndrome) (HCC) - Technically does not meet sepsis criteria. -He was hypotensive with an AKI on admission and elevated lactic acid which may have also been due to hypoperfusion from volume depletion and poor perfusion -Treated with fluids and started on broad-spectrum antibiotics - No obvious source other than opacification of maxillary, frontal, ethmoid sinuses on CT head on admission - Initially started on vancomycin , cefepime , Flagyl .  Okay to de-escalate down to unasyn  and then will do augmentin  at discharge to complete 7 days total   Chronic sinusitis Acute on chronic sinusitis as noted above   SDH (subdural hematoma) (HCC) - CT head showed chronic SDH measuring 9 mm with 2 mm left-to-right midline shift - Imaging reviewed by neurosurgery with no intervention recommended and outpatient follow-up for repeat imaging 4 to 6 weeks  Acute renal failure superimposed on stage 3a chronic kidney disease (HCC) - patient has history of CKD3a. Baseline creat ~ 1.2, eGFR~ 54 - patient presents with increase in creat >0.3 mg/dL above baseline or creat increase >1.5x baseline presumed to have occurred within past 7 days PTA - creat 1.79 on admission - s/p IVF - improved this am    Weakness - will get PT/OT once more improved  NSVT (nonsustained ventricular tachycardia) (HCC) -Continue amiodarone  -Maintaining K >4  , Magnesium > 2.0  Acute on chronic combined systolic and diastolic CHF (congestive heart failure) (HCC) No signs of exacerbation - 03/22/24 echo:  EF 55-60%, normal LV function, normal RV function, no evidence of valvular regurgitation or deformities, negative for any LVH, diastolic parameters were normal - monitor while on IVF  Hypokalemia - replete as needed  Transaminitis - Elevated LFTs:  AST 159, ALT 58, -May have been exacerbated by sepsis -Avoid hepatotoxic, holding statins - s/p  IVF  Normocytic anemia - Baseline hemoglobin  around 9 to 10 g/dL.  Currently at baseline   Hypercholesterolemia - Holding simvastatin  due to elevated LFTs  Essential hypertension POA hypotensive Blood pressure stable now after IV fluid -Holding home BP meds including valsartan   CAD (coronary artery disease), hx CABG 2003 Denies any chest pain, resuming aspirin , holding statins due to transaminitis for now   Antimicrobials: Vancomycin  04/10/2024 >> 04/11/2024 Cefepime  04/10/2024 >> 04/11/2024 Flagyl  04/10/2024 >> 04/11/2024 Unasyn  04/11/2024 >> current  Consultants:    Procedures:    DVT prophylaxis:  SCDs Start: 04/10/24 1212 Place TED hose Start: 04/10/24 1212   Code Status:   Code Status: Full Code  Barriers to discharge: None Therapy evaluation: PT Orders: Active   PT Follow up Rec: Home Health Pt1/31/2026 1155  Disposition Plan: Home Status is: Inpatient  Mobility Assessment (Last 72 Hours)     Mobility Assessment     Row Name 04/11/24 1155 04/11/24 0900 04/11/24 0725 04/10/24 2000 04/10/24 1800   Does the patient have exclusion criteria? -- -- No- Perform mobility assessment No- Perform mobility assessment No- Perform mobility assessment   What is the highest level of mobility based on the mobility assessment? Level 4 (Ambulates with assistance) - Balance while stepping forward/back - Complete Level 4 (Ambulates with assistance) - Balance while stepping forward/back - Complete Level 4 (Ambulates with assistance) - Balance while stepping forward/back - Complete Level 4 (Ambulates with assistance) - Balance while stepping forward/back - Complete Level 4 (Ambulates with assistance) - Balance while stepping forward/back - Complete      Diet: Diet Orders (From admission, onward)     Start     Ordered   04/10/24 1212  Diet regular Room service appropriate? Yes; Fluid consistency: Thin  Diet effective now       Question Answer Comment  Room service appropriate? Yes   Fluid consistency: Thin      04/10/24  1213            Objective: Blood pressure (!) 141/54, pulse 85, temperature 97.9 F (36.6 C), temperature source Oral, resp. rate 20, height 5' 10 (1.778 m), weight 81.8 kg, SpO2 90%.  Examination:  Physical Exam Constitutional:      General: He is not in acute distress.    Appearance: Normal appearance.  HENT:     Head: Normocephalic and atraumatic.     Mouth/Throat:     Mouth: Mucous membranes are moist.  Eyes:     Extraocular Movements: Extraocular movements intact.  Cardiovascular:     Rate and Rhythm: Normal rate and regular rhythm.  Pulmonary:     Effort: Pulmonary effort is normal. No respiratory distress.     Breath sounds: Normal breath sounds. No wheezing.  Abdominal:     General: Bowel sounds are normal. There is no distension.     Palpations: Abdomen is soft.     Tenderness: There is no abdominal tenderness.  Musculoskeletal:        General: Normal range of motion.     Cervical back: Normal range of motion and neck supple.  Skin:    General: Skin is warm and dry.  Neurological:     General: No focal deficit present.     Mental Status: He is alert.  Psychiatric:        Mood and Affect: Mood normal.  Behavior: Behavior normal.      Data Reviewed: Results for orders placed or performed during the hospital encounter of 04/10/24 (from the past 24 hours)  Urinalysis, Routine w reflex microscopic -Urine, Clean Catch     Status: None   Collection Time: 04/10/24  2:12 PM  Result Value Ref Range   Color, Urine YELLOW YELLOW   APPearance CLEAR CLEAR   Specific Gravity, Urine 1.017 1.005 - 1.030   pH 5.0 5.0 - 8.0   Glucose, UA NEGATIVE NEGATIVE mg/dL   Hgb urine dipstick NEGATIVE NEGATIVE   Bilirubin Urine NEGATIVE NEGATIVE   Ketones, ur NEGATIVE NEGATIVE mg/dL   Protein, ur NEGATIVE NEGATIVE mg/dL   Nitrite NEGATIVE NEGATIVE   Leukocytes,Ua NEGATIVE NEGATIVE  Ammonia     Status: None   Collection Time: 04/10/24  9:15 PM  Result Value Ref Range    Ammonia 26 9 - 35 umol/L  Iron  and TIBC     Status: Abnormal   Collection Time: 04/10/24  9:24 PM  Result Value Ref Range   Iron  52 45 - 182 ug/dL   TIBC 775 (L) 749 - 549 ug/dL   Saturation Ratios 23 17.9 - 39.5 %   UIBC 172 ug/dL  Vitamin A87     Status: None   Collection Time: 04/10/24  9:24 PM  Result Value Ref Range   Vitamin B-12 617 180 - 914 pg/mL  Folate     Status: None   Collection Time: 04/10/24  9:24 PM  Result Value Ref Range   Folate 11.9 >5.9 ng/mL  TSH     Status: None   Collection Time: 04/10/24  9:24 PM  Result Value Ref Range   TSH 1.310 0.350 - 4.500 uIU/mL  CBC     Status: Abnormal   Collection Time: 04/11/24  3:11 AM  Result Value Ref Range   WBC 6.8 4.0 - 10.5 K/uL   RBC 3.04 (L) 4.22 - 5.81 MIL/uL   Hemoglobin 9.0 (L) 13.0 - 17.0 g/dL   HCT 71.9 (L) 60.9 - 47.9 %   MCV 92.1 80.0 - 100.0 fL   MCH 29.6 26.0 - 34.0 pg   MCHC 32.1 30.0 - 36.0 g/dL   RDW 82.2 (H) 88.4 - 84.4 %   Platelets 264 150 - 400 K/uL   nRBC 0.0 0.0 - 0.2 %  Comprehensive metabolic panel with GFR     Status: Abnormal   Collection Time: 04/11/24  3:11 AM  Result Value Ref Range   Sodium 135 135 - 145 mmol/L   Potassium 3.7 3.5 - 5.1 mmol/L   Chloride 101 98 - 111 mmol/L   CO2 25 22 - 32 mmol/L   Glucose, Bld 89 70 - 99 mg/dL   BUN 25 (H) 8 - 23 mg/dL   Creatinine, Ser 8.83 0.61 - 1.24 mg/dL   Calcium 8.3 (L) 8.9 - 10.3 mg/dL   Total Protein 5.4 (L) 6.5 - 8.1 g/dL   Albumin 2.8 (L) 3.5 - 5.0 g/dL   AST 878 (H) 15 - 41 U/L   ALT 45 (H) 0 - 44 U/L   Alkaline Phosphatase 72 38 - 126 U/L   Total Bilirubin 0.5 0.0 - 1.2 mg/dL   GFR, Estimated >39 >39 mL/min   Anion gap 9 5 - 15  Glucose, capillary     Status: Abnormal   Collection Time: 04/11/24  8:02 AM  Result Value Ref Range   Glucose-Capillary 108 (H) 70 - 99 mg/dL    I  have reviewed pertinent nursing notes, vitals, labs, and images as necessary. I have ordered labwork to follow up on as indicated.  I have reviewed  the last notes from staff over past 24 hours. I have discussed patient's care plan and test results with nursing staff, CM/SW, and other staff as appropriate.  Old records reviewed in assessment of this patient  Time spent: Greater than 50% of the 55 minute visit was spent in counseling/coordination of care for the patient as laid out in the A&P.   LOS: 1 day   Alm Apo, MD [facility] 04/11/2024, 12:48 PM "

## 2024-04-11 NOTE — Plan of Care (Signed)
" °  Problem: Fluid Volume: Goal: Hemodynamic stability will improve 04/11/2024 0053 by Marvis Kenneth SAILOR, RN Outcome: Progressing 04/10/2024 2100 by Marvis Kenneth SAILOR, RN Outcome: Progressing   Problem: Clinical Measurements: Goal: Diagnostic test results will improve 04/11/2024 0053 by Marvis Kenneth SAILOR, RN Outcome: Progressing 04/10/2024 2100 by Marvis Kenneth SAILOR, RN Outcome: Progressing Goal: Signs and symptoms of infection will decrease 04/11/2024 0053 by Marvis Kenneth SAILOR, RN Outcome: Progressing 04/10/2024 2100 by Marvis Kenneth SAILOR, RN Outcome: Progressing   Problem: Respiratory: Goal: Ability to maintain adequate ventilation will improve 04/11/2024 0053 by Marvis Kenneth SAILOR, RN Outcome: Progressing 04/10/2024 2100 by Marvis Kenneth SAILOR, RN Outcome: Progressing   "

## 2024-04-11 NOTE — Evaluation (Signed)
 Physical Therapy Evaluation Patient Details Name: Michael Bryant MRN: [id number] DOB: 11-05-1933 Today's Date: 04/11/2024  History of Present Illness  89 year old male presented 04/10/24 with  with chief complaint of altered mental status, and hypotension. +sepsis; head CT incidental finding L SDH - no surgical indication per neurosurgery, remote bil cerebellar infarcts; PMH- CAD, s/p CABG 2003, VF arrest in 2003, HTN, HLD, ? CHF, chronic iron  deficiency anemia, recent hospitalization for influenza A, hypoxia.  Was discharged 03/24/2024  Clinical Impression   Pt admitted secondary to problem above with deficits below. PTA patient was recently seen by acute PT during his hospitalization and was able to walk without a device with supervision. He lives with wife with 2 steps to enter, no rails. Pt currently is unsteady and requiring min assist and RW for ambulation. He was found trying to get OOB by himself, despite bed alarming and pt had been told not to get up alone. He admits his cognition is not quite at his normal level. Anticipate patient will benefit from PT to address problems listed below. Will continue to follow acutely to maximize functional mobility, independence, and safety. With good progress with acute PT, feel pt will be able to discharge home with HHPT. Will need stair training prior to discharge.          If plan is discharge home, recommend the following: A little help with walking and/or transfers;A little help with bathing/dressing/bathroom;Assist for transportation;Assistance with cooking/housework   Can travel by private vehicle        Equipment Recommendations None recommended by PT  Recommendations for Other Services       Functional Status Assessment Patient has had a recent decline in their functional status and demonstrates the ability to make significant improvements in function in a reasonable and predictable amount of time.     Precautions / Restrictions  Precautions Precautions: Fall Restrictions Weight Bearing Restrictions Per Provider Order: No      Mobility  Bed Mobility Overal bed mobility: Modified Independent             General bed mobility comments: sitting EOB with bed alarm sounding; wanting to go to bathroom    Transfers Overall transfer level: Needs assistance Equipment used: Rolling walker (2 wheels) Transfers: Sit to/from Stand Sit to Stand: Min assist           General transfer comment: mildly unsteady; cues for use of RW    Ambulation/Gait Ambulation/Gait assistance: Min assist Gait Distance (Feet): 175 Feet Assistive device: Rolling walker (2 wheels) Gait Pattern/deviations: Step-through pattern, Decreased stride length, Trunk flexed, Drifts right/left Gait velocity: decreased Gait velocity interpretation: 1.31 - 2.62 ft/sec, indicative of limited community ambulator   General Gait Details: Patient reported to be unsteady requiring min assist with OT without a device; with RW still required steadying assist (especially on unlevel surface going into restroom)  Careers Information Officer     Tilt Bed    Modified Rankin (Stroke Patients Only)       Balance Overall balance assessment: Needs assistance Sitting-balance support: Feet supported Sitting balance-Leahy Scale: Good     Standing balance support: During functional activity Standing balance-Leahy Scale: Poor Standing balance comment: min A for dynamic standing                             Pertinent Vitals/Pain Pain Assessment Pain Assessment: No/denies pain  Home Living Family/patient expects to be discharged to:: Private residence Living Arrangements: Spouse/significant other Available Help at Discharge: Family Type of Home: House Home Access: Stairs to enter;Level entry Entrance Stairs-Rails: None Entrance Stairs-Number of Steps: 2, no steps at back   Home Layout: One level Home Equipment:  Cane - single Librarian, Academic (2 wheels) Additional Comments: IND without device, spouse in good health and provides transportation due to patient's worsening eye sight    Prior Function Prior Level of Function : Independent/Modified Independent             Mobility Comments: no AD, denies falls ADLs Comments: Indep with ADLs, IADLs, spouse drives as pt has macular degeneration     Extremity/Trunk Assessment   Upper Extremity Assessment Upper Extremity Assessment: Defer to OT evaluation    Lower Extremity Assessment Lower Extremity Assessment: Overall WFL for tasks assessed    Cervical / Trunk Assessment Cervical / Trunk Assessment: Normal  Communication   Communication Communication: No apparent difficulties    Cognition Arousal: Alert Behavior During Therapy: WFL for tasks assessed/performed                           PT - Cognition Comments: reports he's not fully himself Following commands: Intact       Cueing Cueing Techniques: Verbal cues     General Comments General comments (skin integrity, edema, etc.): VSS throughout session.  Pt on RA    Exercises     Assessment/Plan    PT Assessment Patient needs continued PT services  PT Problem List Decreased strength;Decreased activity tolerance;Decreased balance;Decreased mobility;Decreased cognition;Decreased knowledge of use of DME;Decreased safety awareness       PT Treatment Interventions Functional mobility training;Therapeutic exercise;Stair training    PT Goals (Current goals can be found in the Care Plan section)  Acute Rehab PT Goals Patient Stated Goal: get stronger and go back home PT Goal Formulation: With patient Time For Goal Achievement: 04/25/24 Potential to Achieve Goals: Good    Frequency Min 2X/week     Co-evaluation               AM-PAC PT 6 Clicks Mobility  Outcome Measure Help needed turning from your back to your side while in a flat bed without using  bedrails?: None Help needed moving from lying on your back to sitting on the side of a flat bed without using bedrails?: None Help needed moving to and from a bed to a chair (including a wheelchair)?: A Little Help needed standing up from a chair using your arms (e.g., wheelchair or bedside chair)?: A Little Help needed to walk in hospital room?: A Little Help needed climbing 3-5 steps with a railing? : A Little 6 Click Score: 20    End of Session Equipment Utilized During Treatment: Gait belt Activity Tolerance: Patient tolerated treatment well Patient left: with call bell/phone within reach;in chair;with chair alarm set Nurse Communication: Mobility status PT Visit Diagnosis: Muscle weakness (generalized) (M62.81);Unsteadiness on feet (R26.81)    Time: 8941-8883 PT Time Calculation (min) (ACUTE ONLY): 18 min   Charges:   PT Evaluation $PT Eval Low Complexity: 1 Low   PT General Charges $$ ACUTE PT VISIT: 1 Visit          Macario RAMAN, PT Acute Rehabilitation Services  Office [phone]   Macario SHAUNNA Soja 04/11/2024, 12:03 PM

## 2024-04-11 NOTE — Assessment & Plan Note (Signed)
 Acute metabolic encephalopathy due to dehydration Hypovolemic hypotension and SIRS,  sepsis ruled out  Hypokalemia   Patient was placed on IV fluids  Initially placed on broad spectrum antibiotic therapy, then infection was rule out and these were discontinued.  At the time of discharge his renal function has a serum cr of 0,93 with K at 4,2 and serum bicarbonate at 30  Na 136 and Mg 1.8  Elevation in liver enzymes due to hypoperfusion, at the time of discharge AST is 58 and ALT 37   Continue close follow up renal function and electrolytes.

## 2024-04-12 DIAGNOSIS — J329 Chronic sinusitis, unspecified: Secondary | ICD-10-CM

## 2024-04-12 DIAGNOSIS — R6510 Systemic inflammatory response syndrome (SIRS) of non-infectious origin without acute organ dysfunction: Secondary | ICD-10-CM | POA: Diagnosis not present

## 2024-04-12 DIAGNOSIS — S065XAA Traumatic subdural hemorrhage with loss of consciousness status unknown, initial encounter: Secondary | ICD-10-CM

## 2024-04-12 LAB — CBC
HCT: 27.2 % — ABNORMAL LOW (ref 39.0–52.0)
Hemoglobin: 8.7 g/dL — ABNORMAL LOW (ref 13.0–17.0)
MCH: 29.5 pg (ref 26.0–34.0)
MCHC: 32 g/dL (ref 30.0–36.0)
MCV: 92.2 fL (ref 80.0–100.0)
Platelets: 247 10*3/uL (ref 150–400)
RBC: 2.95 MIL/uL — ABNORMAL LOW (ref 4.22–5.81)
RDW: 17.8 % — ABNORMAL HIGH (ref 11.5–15.5)
WBC: 5.6 10*3/uL (ref 4.0–10.5)
nRBC: 0 % (ref 0.0–0.2)

## 2024-04-12 LAB — COMPREHENSIVE METABOLIC PANEL WITH GFR
ALT: 41 U/L (ref 0–44)
AST: 86 U/L — ABNORMAL HIGH (ref 15–41)
Albumin: 2.8 g/dL — ABNORMAL LOW (ref 3.5–5.0)
Alkaline Phosphatase: 69 U/L (ref 38–126)
Anion gap: 9 (ref 5–15)
BUN: 21 mg/dL (ref 8–23)
CO2: 26 mmol/L (ref 22–32)
Calcium: 8.2 mg/dL — ABNORMAL LOW (ref 8.9–10.3)
Chloride: 104 mmol/L (ref 98–111)
Creatinine, Ser: 0.97 mg/dL (ref 0.61–1.24)
GFR, Estimated: >60 mL/min
Glucose, Bld: 99 mg/dL (ref 70–99)
Potassium: 4 mmol/L (ref 3.5–5.1)
Sodium: 138 mmol/L (ref 135–145)
Total Bilirubin: 0.4 mg/dL (ref 0.0–1.2)
Total Protein: 5.4 g/dL — ABNORMAL LOW (ref 6.5–8.1)

## 2024-04-12 LAB — GLUCOSE, CAPILLARY: Glucose-Capillary: 93 mg/dL (ref 70–99)

## 2024-04-12 LAB — MAGNESIUM: Magnesium: 2 mg/dL (ref 1.7–2.4)
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
## 2024-04-12 NOTE — Progress Notes (Signed)
 " Progress Note    Michael Bryant   FMW:[id number]  DOB: [birth year]  DOA: 04/10/2024     2 PCP: Corlis Pagan, NP  Initial CC: AMS, hypotension   Hospital Course: Michael Bryant is an 89 year old male with extensive history of CAD, s/p CABG 2003, VF arrest in 2003, HTN, HLD, ? CHF, chronic iron  deficiency anemia, recent hospitalization for influenza A, hypoxia. Was discharged 03/24/2024. Presented with chief complaint of altered mental status, and hypotension. Per wife at breakfast seemed confused, and less responsive, and was complaining of generalized weaknesses.  Repetitive questioning, and hand movements touching his face and eyes. Patient was subsequently found hypotensive.   Reported tactile fever. No chills, no loss of consciousness.  Patient denies any active pain but is confused and has difficulty providing a complete history.  Wife and daughter present at bedside on admission.   ED Evaluation: POA: Hypotension systolic in 80s, then 108 -subsequent improved with IVF Blood pressure (!) 118/56, pulse 71, temperature 98.2 F (36.8 C), temperature source Oral, resp. rate 17, SpO2 97%. LABs: WBC 8.6, hemoglobin 9.8, potassium 3.1, BUN 35, creatinine 1.79, calcium 8.7, lactic acid 5.4 Influenza A, B, SARS-CoV-2, RSV-all negative  CT head:  1. Low-density subdural collection along the left cerebral convexity measuring up to 9 mm with 2 mm left-to-right midline shift. 2. Complete opacification of the bilateral maxillary sinuses, frontal sinuses, and anterior ethmoid air cells. 3. Generalized atrophy and white matter disease, stable. 4. Remote lacunar infarcts in the cerebellum bilaterally, left greater than right.  Patient met sepsis criteria, started on IV fluids, IV antibiotics, cultures were obtained.  Interval History:  No events overnight.  Intermittently continues to touch his face with his hands which his wife says is new over the past 2 weeks. He is a little bit more oriented  this morning. Called and updated his wife this morning as well. Tentative plan for discharge tomorrow if stable and/or better.  Assessment and Plan: * SIRS (systemic inflammatory response syndrome) (HCC) - Technically does not meet sepsis criteria. -He was hypotensive with an AKI on admission and elevated lactic acid which may have also been due to hypoperfusion from volume depletion and poor perfusion -Treated with fluids and started on broad-spectrum antibiotics - No obvious source other than opacification of maxillary, frontal, ethmoid sinuses on CT head on admission - Initially started on vancomycin , cefepime , Flagyl .  Okay to de-escalate down to unasyn  and then will do augmentin  at discharge to complete 7 days total   SDH (subdural hematoma) (HCC) - CT head showed chronic SDH measuring 9 mm with 2 mm left-to-right midline shift - Imaging reviewed by neurosurgery with no intervention recommended and outpatient follow-up for repeat imaging 4 to 6 weeks  Chronic sinusitis Acute on chronic sinusitis as noted above   Acute renal failure superimposed on stage 3a chronic kidney disease (HCC) - patient has history of CKD3a. Baseline creat ~ 1.2, eGFR~ 54 - patient presents with increase in creat >0.3 mg/dL above baseline or creat increase >1.5x baseline presumed to have occurred within past 7 days PTA - creat 1.79 on admission - s/p IVF - improved   Weakness - Evaluated by PT/OT.  Recommended for home health PT and shower bench  NSVT (nonsustained ventricular tachycardia) (HCC) -Continue amiodarone  -Maintaining K >4  , Magnesium > 2.0  Acute on chronic combined systolic and diastolic CHF (congestive heart failure) (HCC) No signs of exacerbation - 03/22/24 echo:  EF 55-60%, normal LV function, normal RV function, no  evidence of valvular regurgitation or deformities, negative for any LVH, diastolic parameters were normal - monitor while on IVF  Hypokalemia - replete as  needed  Transaminitis - Elevated LFTs: AST 159, ALT 58, -May have been exacerbated by sepsis -Avoid hepatotoxic, holding statins - s/p  IVF  Normocytic anemia - Baseline hemoglobin around 9 to 10 g/dL.  Currently at baseline   Hypercholesterolemia - Holding simvastatin  due to elevated LFTs  Essential hypertension POA hypotensive Blood pressure stable now after IV fluid -Holding home BP meds including valsartan   CAD (coronary artery disease), hx CABG 2003 Denies any chest pain, resuming aspirin , holding statins due to transaminitis for now   Antimicrobials: Vancomycin  04/10/2024 >> 04/11/2024 Cefepime  04/10/2024 >> 04/11/2024 Flagyl  04/10/2024 >> 04/11/2024 Unasyn  04/11/2024 >> current  Consultants:    Procedures:    DVT prophylaxis:  SCDs Start: 04/10/24 1212 Place TED hose Start: 04/10/24 1212   Code Status:   Code Status: Full Code  Barriers to discharge: None Therapy evaluation: PT Orders: Active   PT Follow up Rec: Home Health Pt1/31/2026 1155  Disposition Plan: Home Status is: Inpatient  Mobility Assessment (Last 72 Hours)     Mobility Assessment     Row Name 04/12/24 0735 04/11/24 2000 04/11/24 1155 04/11/24 0900 04/11/24 0725   Does the patient have exclusion criteria? No- Perform mobility assessment No- Perform mobility assessment -- -- No- Perform mobility assessment   What is the highest level of mobility based on the mobility assessment? Level 4 (Ambulates with assistance) - Balance while stepping forward/back - Complete Level 4 (Ambulates with assistance) - Balance while stepping forward/back - Complete Level 4 (Ambulates with assistance) - Balance while stepping forward/back - Complete Level 4 (Ambulates with assistance) - Balance while stepping forward/back - Complete Level 4 (Ambulates with assistance) - Balance while stepping forward/back - Complete    Row Name 04/10/24 2000 04/10/24 1800         Does the patient have exclusion criteria? No-  Perform mobility assessment No- Perform mobility assessment      What is the highest level of mobility based on the mobility assessment? Level 4 (Ambulates with assistance) - Balance while stepping forward/back - Complete Level 4 (Ambulates with assistance) - Balance while stepping forward/back - Complete         Diet: Diet Orders (From admission, onward)     Start     Ordered   04/10/24 1212  Diet regular Room service appropriate? Yes; Fluid consistency: Thin  Diet effective now       Question Answer Comment  Room service appropriate? Yes   Fluid consistency: Thin      04/10/24 1213            Objective: Blood pressure (!) 115/54, pulse 74, temperature 98 F (36.7 C), temperature source Oral, resp. rate 16, height 5' 10 (1.778 m), weight 81.8 kg, SpO2 99%.  Examination:  Physical Exam Constitutional:      General: He is not in acute distress.    Appearance: Normal appearance.  HENT:     Head: Normocephalic and atraumatic.     Mouth/Throat:     Mouth: Mucous membranes are moist.  Eyes:     Extraocular Movements: Extraocular movements intact.  Cardiovascular:     Rate and Rhythm: Normal rate and regular rhythm.  Pulmonary:     Effort: Pulmonary effort is normal. No respiratory distress.     Breath sounds: Normal breath sounds. No wheezing.  Abdominal:     General:  Bowel sounds are normal. There is no distension.     Palpations: Abdomen is soft.     Tenderness: There is no abdominal tenderness.  Musculoskeletal:        General: Normal range of motion.     Cervical back: Normal range of motion and neck supple.  Skin:    General: Skin is warm and dry.  Neurological:     General: No focal deficit present.     Mental Status: He is alert.  Psychiatric:        Mood and Affect: Mood normal.        Behavior: Behavior normal.      Data Reviewed: Results for orders placed or performed during the hospital encounter of 04/10/24 (from the past 24 hours)  CBC     Status:  Abnormal   Collection Time: 04/12/24  3:19 AM  Result Value Ref Range   WBC 5.6 4.0 - 10.5 K/uL   RBC 2.95 (L) 4.22 - 5.81 MIL/uL   Hemoglobin 8.7 (L) 13.0 - 17.0 g/dL   HCT 72.7 (L) 60.9 - 47.9 %   MCV 92.2 80.0 - 100.0 fL   MCH 29.5 26.0 - 34.0 pg   MCHC 32.0 30.0 - 36.0 g/dL   RDW 82.1 (H) 88.4 - 84.4 %   Platelets 247 150 - 400 K/uL   nRBC 0.0 0.0 - 0.2 %  Comprehensive metabolic panel with GFR     Status: Abnormal   Collection Time: 04/12/24  3:19 AM  Result Value Ref Range   Sodium 138 135 - 145 mmol/L   Potassium 4.0 3.5 - 5.1 mmol/L   Chloride 104 98 - 111 mmol/L   CO2 26 22 - 32 mmol/L   Glucose, Bld 99 70 - 99 mg/dL   BUN 21 8 - 23 mg/dL   Creatinine, Ser 9.02 0.61 - 1.24 mg/dL   Calcium 8.2 (L) 8.9 - 10.3 mg/dL   Total Protein 5.4 (L) 6.5 - 8.1 g/dL   Albumin 2.8 (L) 3.5 - 5.0 g/dL   AST 86 (H) 15 - 41 U/L   ALT 41 0 - 44 U/L   Alkaline Phosphatase 69 38 - 126 U/L   Total Bilirubin 0.4 0.0 - 1.2 mg/dL   GFR, Estimated >39 >39 mL/min   Anion gap 9 5 - 15  Magnesium      Status: None   Collection Time: 04/12/24  3:19 AM  Result Value Ref Range   Magnesium  2.0 1.7 - 2.4 mg/dL  Glucose, capillary     Status: None   Collection Time: 04/12/24  8:08 AM  Result Value Ref Range   Glucose-Capillary 93 70 - 99 mg/dL    I have reviewed pertinent nursing notes, vitals, labs, and images as necessary. I have ordered labwork to follow up on as indicated.  I have reviewed the last notes from staff over past 24 hours. I have discussed patient's care plan and test results with nursing staff, CM/SW, and other staff as appropriate.  Old records reviewed in assessment of this patient  Time spent: Greater than 50% of the 55 minute visit was spent in counseling/coordination of care for the patient as laid out in the A&P.   LOS: 2 days   Alm Apo, MD [facility] 04/12/2024, 12:37 PM "

## 2024-04-12 NOTE — Plan of Care (Signed)
" °  Problem: Fluid Volume: Goal: Hemodynamic stability will improve 04/12/2024 0016 by Marvis Kenneth SAILOR, RN Outcome: Progressing 04/11/2024 2246 by Marvis Kenneth SAILOR, RN Outcome: Progressing   Problem: Clinical Measurements: Goal: Diagnostic test results will improve 04/12/2024 0016 by Marvis Kenneth SAILOR, RN Outcome: Progressing 04/11/2024 2246 by Marvis Kenneth SAILOR, RN Outcome: Progressing Goal: Signs and symptoms of infection will decrease 04/12/2024 0016 by Marvis Kenneth SAILOR, RN Outcome: Progressing 04/11/2024 2246 by Marvis Kenneth SAILOR, RN Outcome: Progressing   Problem: Respiratory: Goal: Ability to maintain adequate ventilation will improve 04/12/2024 0016 by Marvis Kenneth SAILOR, RN Outcome: Progressing 04/11/2024 2246 by Marvis Kenneth SAILOR, RN Outcome: Progressing   "

## 2024-04-13 ENCOUNTER — Other Ambulatory Visit (HOSPITAL_COMMUNITY): Payer: Self-pay

## 2024-04-13 DIAGNOSIS — R4189 Other symptoms and signs involving cognitive functions and awareness: Secondary | ICD-10-CM | POA: Diagnosis not present

## 2024-04-13 DIAGNOSIS — R6510 Systemic inflammatory response syndrome (SIRS) of non-infectious origin without acute organ dysfunction: Secondary | ICD-10-CM | POA: Diagnosis not present

## 2024-04-13 LAB — CBC WITH DIFFERENTIAL/PLATELET
Abs Immature Granulocytes: 0.05 10*3/uL (ref 0.00–0.07)
Basophils Absolute: 0.1 10*3/uL (ref 0.0–0.1)
Basophils Relative: 1 %
Eosinophils Absolute: 0.2 10*3/uL (ref 0.0–0.5)
Eosinophils Relative: 3 %
HCT: 29.7 % — ABNORMAL LOW (ref 39.0–52.0)
Hemoglobin: 9.6 g/dL — ABNORMAL LOW (ref 13.0–17.0)
Immature Granulocytes: 1 %
Lymphocytes Relative: 18 %
Lymphs Abs: 1.2 10*3/uL (ref 0.7–4.0)
MCH: 29.7 pg (ref 26.0–34.0)
MCHC: 32.3 g/dL (ref 30.0–36.0)
MCV: 92 fL (ref 80.0–100.0)
Monocytes Absolute: 0.8 10*3/uL (ref 0.1–1.0)
Monocytes Relative: 12 %
Neutro Abs: 4.4 10*3/uL (ref 1.7–7.7)
Neutrophils Relative %: 65 %
Platelets: 242 10*3/uL (ref 150–400)
RBC: 3.23 MIL/uL — ABNORMAL LOW (ref 4.22–5.81)
RDW: 17.7 % — ABNORMAL HIGH (ref 11.5–15.5)
WBC: 6.6 10*3/uL (ref 4.0–10.5)
nRBC: 0 % (ref 0.0–0.2)

## 2024-04-13 LAB — COMPREHENSIVE METABOLIC PANEL WITH GFR
ALT: 37 U/L (ref 0–44)
AST: 58 U/L — ABNORMAL HIGH (ref 15–41)
Albumin: 2.9 g/dL — ABNORMAL LOW (ref 3.5–5.0)
Alkaline Phosphatase: 72 U/L (ref 38–126)
Anion gap: 9 (ref 5–15)
BUN: 18 mg/dL (ref 8–23)
CO2: 28 mmol/L (ref 22–32)
Calcium: 8.6 mg/dL — ABNORMAL LOW (ref 8.9–10.3)
Chloride: 101 mmol/L (ref 98–111)
Creatinine, Ser: 1.07 mg/dL (ref 0.61–1.24)
GFR, Estimated: >60 mL/min
Glucose, Bld: 122 mg/dL — ABNORMAL HIGH (ref 70–99)
Potassium: 4.2 mmol/L (ref 3.5–5.1)
Sodium: 138 mmol/L (ref 135–145)
Total Bilirubin: 0.4 mg/dL (ref 0.0–1.2)
Total Protein: 5.8 g/dL — ABNORMAL LOW (ref 6.5–8.1)

## 2024-04-13 LAB — MAGNESIUM: Magnesium: 1.8 mg/dL (ref 1.7–2.4)

## 2024-04-13 LAB — GLUCOSE, CAPILLARY: Glucose-Capillary: 120 mg/dL — ABNORMAL HIGH (ref 70–99)

## 2024-04-13 MED ORDER — MEMANTINE HCL 10 MG PO TABS
10.0000 mg | ORAL_TABLET | Freq: Every day | ORAL | Status: DC
  Administered 2024-04-13 – 2024-04-15 (×3): 10 mg via ORAL
  Filled 2024-04-13 (×3): qty 1

## 2024-04-13 MED ORDER — AMOXICILLIN-POT CLAVULANATE 875-125 MG PO TABS
1.0000 | ORAL_TABLET | Freq: Two times a day (BID) | ORAL | Status: DC
  Administered 2024-04-13 – 2024-04-15 (×5): 1 via ORAL
  Filled 2024-04-13 (×6): qty 1

## 2024-04-13 MED ORDER — AMOXICILLIN-POT CLAVULANATE 875-125 MG PO TABS
1.0000 | ORAL_TABLET | Freq: Two times a day (BID) | ORAL | 0 refills | Status: DC
  Filled 2024-04-13: qty 8, 4d supply, fill #0

## 2024-04-13 MED ORDER — PROPRANOLOL HCL 20 MG PO TABS
20.0000 mg | ORAL_TABLET | Freq: Two times a day (BID) | ORAL | Status: DC
  Administered 2024-04-13 – 2024-04-14 (×2): 20 mg via ORAL
  Filled 2024-04-13 (×6): qty 1

## 2024-04-13 MED ORDER — DONEPEZIL HCL 10 MG PO TABS
10.0000 mg | ORAL_TABLET | Freq: Every day | ORAL | Status: DC
  Administered 2024-04-13 – 2024-04-14 (×2): 10 mg via ORAL
  Filled 2024-04-13 (×2): qty 1

## 2024-04-13 NOTE — Progress Notes (Addendum)
 Occupational Therapy Treatment Patient Details Name: Michael Bryant MRN: [id number] DOB: 1933-09-18 Today's Date: 04/13/2024   History of present illness 89 year old male presented 04/10/24 with  with chief complaint of altered mental status, and hypotension. +sepsis; head CT incidental finding L SDH - no surgical indication per neurosurgery, remote bil cerebellar infarcts; PMH- CAD, s/p CABG 2003, VF arrest in 2003, HTN, HLD, ? CHF, chronic iron  deficiency anemia, recent hospitalization for influenza A, hypoxia.  Was discharged 03/24/2024   OT comments  Pt with increased confusion today. Unable to complete the Short Blessed Test as comparison from previous session due to confusion. Increased assistance required for mobility and ADL tasks as well due to confusion. Required mod A with transfer to chair as pt was trying to sit in between chair and bed. Given decline in function, requiring mod to max A with ADL tasks and mod A with mobility due to generalized weakness and cognitive deficits, recommend continued inpatient follow up therapy, <3 hours/day.  VSS on RA.       If plan is discharge home, recommend the following:  A little help with walking and/or transfers;A little help with bathing/dressing/bathroom;Direct supervision/assist for medications management;Direct supervision/assist for financial management;Assist for transportation   Equipment Recommendations  Tub/shower bench    Recommendations for Other Services      Precautions / Restrictions Precautions Precautions: Fall Recall of Precautions/Restrictions: Impaired Precaution/Restrictions Comments: restless       Mobility Bed Mobility Overal bed mobility: Needs Assistance Bed Mobility: Supine to Sit     Supine to sit: Mod assist          Transfers Overall transfer level: Needs assistance Equipment used: Rolling walker (2 wheels)   Sit to Stand: Min assist     Step pivot transfers: Mod assist     General transfer  comment: pt trying to sit in between the bed and chair requiring mod A to redirect safely to chair     Balance     Sitting balance-Leahy Scale: Fair       Standing balance-Leahy Scale: Poor                             ADL either performed or assessed with clinical judgement   ADL Overall ADL's : Needs assistance/impaired Eating/Feeding: Minimal assistance   Grooming: Moderate assistance   Upper Body Bathing: Moderate assistance   Lower Body Bathing: Moderate assistance   Upper Body Dressing : Moderate assistance                   Functional mobility during ADLs: Moderate assistance;Rolling walker (2 wheels);Cueing for safety;Cueing for sequencing General ADL Comments: perseverating; random hand movements; appears to be hallucinating    Extremity/Trunk Assessment Upper Extremity Assessment Upper Extremity Assessment: Generalized weakness   Lower Extremity Assessment Lower Extremity Assessment: Defer to PT evaluation        Vision   Additional Comments: acuity imparied; MD   Perception Perception Perception: Impaired   Praxis     Communication Communication Communication: Impaired Factors Affecting Communication: Difficulty expressing self   Cognition Arousal: Lethargic Behavior During Therapy: Restless, Impulsive Cognition: Cognition impaired   Orientation impairments: Time, Situation Awareness: Intellectual awareness impaired, Online awareness impaired Memory impairment (select all impairments): Short-term memory, Working civil service fast streamer, Engineer, structural memory Attention impairment (select first level of impairment): Focused attention Executive functioning impairment (select all impairments): Organization, Sequencing, Reasoning, Problem solving OT - Cognition Comments: unable to complete the  short blessed test due to confusion                 Following commands: Impaired Following commands impaired: Follows one step commands  inconsistently      Cueing   Cueing Techniques: Verbal cues, Gestural cues, Tactile cues, Visual cues  Exercises      Shoulder Instructions       General Comments pt restless; trying to push IV controls, fiddling with IV line; gave pt washcloth to clean table, busy mat adn various objects to engage his attention    Pertinent Vitals/ Pain       Pain Assessment Pain Assessment: Faces Faces Pain Scale: Hurts a little bit Pain Location: back Pain Descriptors / Indicators: Discomfort Pain Intervention(s): Limited activity within patient's tolerance  Home Living                                          Prior Functioning/Environment              Frequency  Min 2X/week        Progress Toward Goals  OT Goals(current goals can now be found in the care plan section)  Progress towards OT goals: Progressing toward goals  Acute Rehab OT Goals Patient Stated Goal: none stated OT Goal Formulation: With patient Time For Goal Achievement: 04/23/24 Potential to Achieve Goals: Good ADL Goals Pt Will Perform Grooming: with supervision;standing Pt Will Perform Upper Body Bathing: with set-up;sitting Pt Will Perform Lower Body Bathing: with supervision;sit to/from stand Pt Will Perform Upper Body Dressing: with set-up;sitting Pt Will Perform Lower Body Dressing: with supervision;sit to/from stand Pt Will Transfer to Toilet: with supervision;ambulating;grab bars Pt Will Perform Toileting - Clothing Manipulation and hygiene: with supervision;sit to/from stand Pt Will Perform Tub/Shower Transfer: Tub transfer;with contact guard assist;rolling walker;tub bench  Plan      Co-evaluation                 AM-PAC OT 6 Clicks Daily Activity     Outcome Measure   Help from another person eating meals?: A Little Help from another person taking care of personal grooming?: A Lot Help from another person toileting, which includes using toliet, bedpan, or  urinal?: A Lot Help from another person bathing (including washing, rinsing, drying)?: A Lot Help from another person to put on and taking off regular upper body clothing?: A Lot Help from another person to put on and taking off regular lower body clothing?: A Lot 6 Click Score: 13    End of Session Equipment Utilized During Treatment: Gait belt;Rolling walker (2 wheels)  OT Visit Diagnosis: Unsteadiness on feet (R26.81);Other symptoms and signs involving cognitive function   Activity Tolerance Patient tolerated treatment well   Patient Left in chair;with call bell/phone within reach;with chair alarm set   Nurse Communication Other (comment);Mobility status (pt more confused)        Time: 1010-1040 OT Time Calculation (min): 30 min  Charges: OT General Charges $OT Visit: 1 Visit OT Treatments $Self Care/Home Management : 23-37 mins  Kreg Sink, OT/L   Acute OT Clinical Specialist Acute Rehabilitation Services Pager [phone] Office [phone]   [facility] 04/13/2024, 10:55 AM

## 2024-04-13 NOTE — NC FL2 (Signed)
 " [place]  MEDICAID FL2 LEVEL OF CARE FORM     IDENTIFICATION  Patient Name: Michael Bryant Birthdate: 1933/10/22 Sex: male Admission Date (Current Location): 04/10/2024  [facility] and [place] Number:  Producer, Television/film/video and Address:  [facility] [place], [address], [place], [name] [address]      Provider Number: [id number]  Attending Physician Name and Address:  Patsy Lenis, MD  Relative Name and Phone Number:       Current Level of Care: Hospital Recommended Level of Care: Skilled Nursing Facility Prior Approval Number:    Date Approved/Denied:   PASRR Number: [id number]  Discharge Plan: SNF    Current Diagnoses: Patient Active Problem List   Diagnosis Date Noted   Cognitive impairment 04/13/2024   SDH (subdural hematoma) (HCC) 04/10/2024   Transaminitis 04/10/2024   Hypokalemia 04/10/2024   Malnutrition of moderate degree 03/23/2024   Acute respiratory failure with hypoxia (HCC) 03/21/2024   Weakness 03/21/2024   Influenza A 03/21/2024   Acute renal failure superimposed on stage 3a chronic kidney disease (HCC) 03/21/2024   Colonic mass 03/21/2024   Normocytic anemia 03/21/2024   Chronic sinusitis 03/21/2024   Acute on chronic combined systolic and diastolic CHF (congestive heart failure) (HCC) 03/21/2024   Failure to thrive in adult 03/21/2024   Hyponatremia 03/21/2024   Hypercholesterolemia 03/30/2023   Syncope 07/02/2013   History of ventricular fibrillation 07/02/2013   NSVT (nonsustained ventricular tachycardia) (HCC) 06/16/2013   Myocardial infarction, old 06/16/2013   Essential hypertension 03/25/2013   CAD (coronary artery disease), hx CABG 2003 03/24/2013    Orientation RESPIRATION BLADDER Height & Weight     Self, Place, Situation  Normal Continent Weight: 180 lb 5.4 oz (81.8 kg) Height:  5' 10 (177.8 cm)  BEHAVIORAL SYMPTOMS/MOOD NEUROLOGICAL BOWEL NUTRITION STATUS      Continent Diet (please see discharge summary)   AMBULATORY STATUS COMMUNICATION OF NEEDS Skin   Limited Assist Verbally Other (Comment) (Erythema)                       Personal Care Assistance Level of Assistance  Bathing, Feeding, Dressing Bathing Assistance: Limited assistance Feeding assistance: Independent Dressing Assistance: Limited assistance     Functional Limitations Info  Sight, Hearing, Speech Sight Info: Impaired (glasses) Hearing Info: Adequate Speech Info: Adequate    SPECIAL CARE FACTORS FREQUENCY  PT (By licensed PT), OT (By licensed OT)     PT Frequency: 5x epr week OT Frequency: 5x per week            Contractures Contractures Info: Not present    Additional Factors Info  Code Status, Allergies Code Status Info: FULL Allergies Info: Codeine           Current Medications (04/13/2024):  This is the current hospital active medication list Current Facility-Administered Medications  Medication Dose Route Frequency Provider Last Rate Last Admin   acetaminophen  (TYLENOL ) tablet 650 mg  650 mg Oral Q6H PRN Willette Jest A, MD   650 mg at 04/12/24 2208   Or   acetaminophen  (TYLENOL ) suppository 650 mg  650 mg Rectal Q6H PRN Shahmehdi, Jest A, MD       amiodarone  (PACERONE ) tablet 100 mg  100 mg Oral Daily Shahmehdi, Seyed A, MD   100 mg at 04/13/24 0900   amoxicillin -clavulanate (AUGMENTIN ) 875-125 MG per tablet 1 tablet  1 tablet Oral Q12H Girguis, David, MD   1 tablet at 04/13/24 1117   aspirin   EC tablet 81 mg  81 mg Oral Daily Shahmehdi, Seyed A, MD   81 mg at 04/13/24 0900   azelastine  (ASTELIN ) 0.1 % nasal spray 2 spray  2 spray Each Nare BID Willette Adriana LABOR, MD   2 spray at 04/13/24 9096   cholecalciferol  (VITAMIN D3) 25 MCG (1000 UNIT) tablet 2,000 Units  2,000 Units Oral Daily Shahmehdi, Seyed A, MD   2,000 Units at 04/13/24 0900   donepezil  (ARICEPT ) tablet 10 mg  10 mg Oral QHS Patsy Lenis, MD   10 mg at 04/13/24 1354   feeding supplement (ENSURE PLUS HIGH PROTEIN) liquid 237 mL   237 mL Oral BID BM Patsy Lenis, MD   237 mL at 04/13/24 1354   FLUoxetine  (PROZAC ) capsule 10 mg  10 mg Oral QHS Shahmehdi, Seyed A, MD   10 mg at 04/12/24 2211   fluticasone  (FLONASE ) 50 MCG/ACT nasal spray 2 spray  2 spray Each Nare Daily Shahmehdi, Seyed A, MD   2 spray at 04/13/24 9096   guaiFENesin  (ROBITUSSIN) 100 MG/5ML liquid 15 mL  15 mL Oral Q8H Shahmehdi, Seyed A, MD   15 mL at 04/13/24 1354   haloperidol  lactate (HALDOL ) injection 2 mg  2 mg Intravenous Q6H PRN Willette Adriana A, MD   2 mg at 04/12/24 2348   hydrALAZINE  (APRESOLINE ) injection 10 mg  10 mg Intravenous Q4H PRN Shahmehdi, Seyed A, MD       HYDROmorphone  (DILAUDID ) injection 0.5-1 mg  0.5-1 mg Intravenous Q2H PRN Shahmehdi, Seyed A, MD   1 mg at 04/12/24 2349   ipratropium (ATROVENT ) nebulizer solution 0.5 mg  0.5 mg Nebulization Q6H PRN Shahmehdi, Seyed A, MD       lactobacillus (FLORANEX/LACTINEX) granules 1 g  1 g Oral TID WC Shahmehdi, Seyed A, MD   1 g at 04/13/24 1117   loratadine  (CLARITIN ) tablet 10 mg  10 mg Oral Daily Shahmehdi, Seyed A, MD   10 mg at 04/13/24 0900   memantine  (NAMENDA ) tablet 10 mg  10 mg Oral Daily Patsy Lenis, MD   10 mg at 04/13/24 1354   naphazoline-glycerin  (CLEAR EYES REDNESS) ophth solution 2 drop  2 drop Both Eyes QID Shahmehdi, Seyed A, MD   2 drop at 04/13/24 1354   nitroGLYCERIN  (NITROSTAT ) SL tablet 0.4 mg  0.4 mg Sublingual Q5 min PRN Shahmehdi, Seyed A, MD       ondansetron  (ZOFRAN ) tablet 4 mg  4 mg Oral Q6H PRN Shahmehdi, Seyed A, MD       Or   ondansetron  (ZOFRAN ) injection 4 mg  4 mg Intravenous Q6H PRN Shahmehdi, Seyed A, MD       oxyCODONE  (Oxy IR/ROXICODONE ) immediate release tablet 5 mg  5 mg Oral Q4H PRN Willette Adriana A, MD   5 mg at 04/12/24 2207   pantoprazole  (PROTONIX ) EC tablet 40 mg  40 mg Oral Daily Shahmehdi, Seyed A, MD   40 mg at 04/13/24 0900   propranolol  (INDERAL ) tablet 20 mg  20 mg Oral BID Patsy Lenis, MD       senna-docusate (Senokot-S)  tablet 1 tablet  1 tablet Oral QHS PRN Shahmehdi, Seyed A, MD       sodium chloride  flush (NS) 0.9 % injection 3 mL  3 mL Intravenous Q12H Shahmehdi, Seyed A, MD   3 mL at 04/13/24 0903   sodium chloride  flush (NS) 0.9 % injection 3 mL  3 mL Intravenous Q12H Shahmehdi, Seyed A, MD   3 mL at  04/13/24 9096   tamsulosin  (FLOMAX ) capsule 0.4 mg  0.4 mg Oral Daily Shahmehdi, Seyed A, MD   0.4 mg at 04/13/24 0900   traZODone  (DESYREL ) tablet 25 mg  25 mg Oral QHS PRN Willette Adriana LABOR, MD   25 mg at 04/12/24 2207     Discharge Medications: Please see discharge summary for a list of discharge medications.  Relevant Imaging Results:  Relevant Lab Results:   Additional Information SSN [id number]  [name], LCSW     "

## 2024-04-13 NOTE — Assessment & Plan Note (Addendum)
 Family is describing slow decline ever since sinusitis diagnosis months ago.  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  Also having unintentional hand movements, not quite consistent with intentional tremor; Parkinson would also be on differential.  Some agitation at times.    -Some suspicion he may have underlying undiagnosed dementia or some symptoms associated with the underlying subdural.  CT does show generalized atrophy and white matter disease along with lacunar infarcts in the cerebellum.  All of this may contribute to underlying dementia picture as well  Plan to follow up as outpatient Continue physical and occupational therapy at [facility]

## 2024-04-14 ENCOUNTER — Other Ambulatory Visit (HOSPITAL_COMMUNITY): Payer: Self-pay

## 2024-04-14 LAB — GLUCOSE, CAPILLARY: Glucose-Capillary: 105 mg/dL — ABNORMAL HIGH (ref 70–99)

## 2024-04-14 NOTE — TOC Progression Note (Signed)
 Transition of Care [facility]) - Progression Note    Patient Details  Name: Michael Bryant MRN: [id number] Date of Birth: [birth year]  [facility] of [facility]) CM/SW Contact  [name], [name] Phone Number: 04/14/2024, 4:27 PM  Clinical Narrative:    [name] is approved for [facility]. Auth ID [id number].  Plan Auth ID [id number]   Dates 04/14/24-04/16/24/   Expected Discharge Plan: Skilled Nursing Facility Barriers to Discharge: Continued Medical Work up               Expected Discharge Plan and Services   Discharge Planning Services: CM Consult Post Acute Care Choice: Home Health, Durable Medical Equipment Living arrangements for the past 2 months: Single Family Home Expected Discharge Date: 04/13/24               DME Arranged: Shower stool (insurance does not cover- pt will need to purchase themselves) DME Agency: NA       HH Arranged: PT HH Agency: [facility] Date [facility] Agency Contacted: 04/12/24 Time HH Agency Contacted: [phone] Representative spoke with at [facility] Agency: Hub/Cory   Social Drivers of Health (SDOH) Interventions SDOH Screenings   Food Insecurity: No Food Insecurity (04/10/2024)  Housing: Low Risk (04/10/2024)  Transportation Needs: No Transportation Needs (04/10/2024)  Utilities: Not At Risk (04/10/2024)  Social Connections: Socially Integrated (04/10/2024)  Tobacco Use: Medium Risk (04/10/2024)    Readmission Risk Interventions     No data to display

## 2024-04-14 NOTE — Plan of Care (Signed)
" °  Problem: Fluid Volume: Goal: Hemodynamic stability will improve 04/14/2024 0004 by Marvis Kenneth SAILOR, RN Outcome: Progressing 04/13/2024 1952 by Marvis Kenneth SAILOR, RN Outcome: Progressing   Problem: Clinical Measurements: Goal: Diagnostic test results will improve 04/14/2024 0004 by Marvis Kenneth SAILOR, RN Outcome: Progressing 04/13/2024 1952 by Marvis Kenneth SAILOR, RN Outcome: Progressing Goal: Signs and symptoms of infection will decrease 04/14/2024 0004 by Marvis Kenneth SAILOR, RN Outcome: Progressing 04/13/2024 1952 by Marvis Kenneth SAILOR, RN Outcome: Progressing   Problem: Respiratory: Goal: Ability to maintain adequate ventilation will improve 04/14/2024 0004 by Marvis Kenneth SAILOR, RN Outcome: Progressing 04/13/2024 1952 by Marvis Kenneth SAILOR, RN Outcome: Progressing   Problem: Education: Goal: Knowledge of General Education information will improve Description: Including pain rating scale, medication(s)/side effects and non-pharmacologic comfort measures 04/14/2024 0004 by Marvis Kenneth SAILOR, RN Outcome: Progressing 04/13/2024 1952 by Marvis Kenneth SAILOR, RN Outcome: Progressing   Problem: Health Behavior/Discharge Planning: Goal: Ability to manage health-related needs will improve 04/14/2024 0004 by Marvis Kenneth SAILOR, RN Outcome: Progressing 04/13/2024 1952 by Marvis Kenneth SAILOR, RN Outcome: Progressing   Problem: Clinical Measurements: Goal: Ability to maintain clinical measurements within normal limits will improve 04/14/2024 0004 by Marvis Kenneth SAILOR, RN Outcome: Progressing 04/13/2024 1952 by Marvis Kenneth SAILOR, RN Outcome: Progressing Goal: Will remain free from infection 04/14/2024 0004 by Marvis Kenneth SAILOR, RN Outcome: Progressing 04/13/2024 1952 by Marvis Kenneth SAILOR, RN Outcome: Progressing Goal: Diagnostic test results will improve 04/14/2024 0004 by Marvis Kenneth SAILOR, RN Outcome: Progressing 04/13/2024 1952 by Marvis Kenneth SAILOR, RN Outcome: Progressing Goal: Respiratory  complications will improve 04/14/2024 0004 by Marvis Kenneth SAILOR, RN Outcome: Progressing 04/13/2024 1952 by Marvis Kenneth SAILOR, RN Outcome: Progressing Goal: Cardiovascular complication will be avoided 04/14/2024 0004 by Marvis Kenneth SAILOR, RN Outcome: Progressing 04/13/2024 1952 by Marvis Kenneth SAILOR, RN Outcome: Progressing   Problem: Activity: Goal: Risk for activity intolerance will decrease 04/14/2024 0004 by Marvis Kenneth SAILOR, RN Outcome: Progressing 04/13/2024 1952 by Marvis Kenneth SAILOR, RN Outcome: Progressing   Problem: Nutrition: Goal: Adequate nutrition will be maintained 04/14/2024 0004 by Marvis Kenneth SAILOR, RN Outcome: Progressing 04/13/2024 1952 by Marvis Kenneth SAILOR, RN Outcome: Progressing   Problem: Coping: Goal: Level of anxiety will decrease 04/14/2024 0004 by Marvis Kenneth SAILOR, RN Outcome: Progressing 04/13/2024 1952 by Marvis Kenneth SAILOR, RN Outcome: Progressing   Problem: Elimination: Goal: Will not experience complications related to bowel motility 04/14/2024 0004 by Marvis Kenneth SAILOR, RN Outcome: Progressing 04/13/2024 1952 by Marvis Kenneth SAILOR, RN Outcome: Progressing Goal: Will not experience complications related to urinary retention 04/14/2024 0004 by Marvis Kenneth SAILOR, RN Outcome: Progressing 04/13/2024 1952 by Marvis Kenneth SAILOR, RN Outcome: Progressing   Problem: Pain Managment: Goal: General experience of comfort will improve and/or be controlled 04/14/2024 0004 by Marvis Kenneth SAILOR, RN Outcome: Progressing 04/13/2024 1952 by Marvis Kenneth SAILOR, RN Outcome: Progressing   Problem: Safety: Goal: Ability to remain free from injury will improve 04/14/2024 0004 by Marvis Kenneth SAILOR, RN Outcome: Progressing 04/13/2024 1952 by Marvis Kenneth SAILOR, RN Outcome: Progressing   Problem: Skin Integrity: Goal: Risk for impaired skin integrity will decrease 04/14/2024 0004 by Marvis Kenneth SAILOR, RN Outcome: Progressing 04/13/2024 1952 by Marvis Kenneth SAILOR, RN Outcome:  Progressing   "

## 2024-04-14 NOTE — TOC Progression Note (Signed)
 Transition of Care [facility]) - Progression Note    Patient Details  Name: Michael Bryant MRN: [id number] Date of Birth: [birth year]  [facility]) CM/SW Contact  [name], [name] Phone Number: 04/14/2024, 3:00 PM  Clinical Narrative:     CSW met with patient's daughter, [name]- CSW provided bed offers. [facility] SN[facility].  [facility] bed available  [facility] offered and confirmed bed offer. CMA started insurance authorization- reference # [id number]   TOC will continue to follow and assist with discharge planning.   Montie Louder, MSW, LCSW Clinical Social Worker    Expected Discharge Plan: Skilled Nursing Facility Barriers to Discharge: English As A Second Language Teacher, Continued Medical Work up               Expected Discharge Plan and Services   Discharge Planning Services: CM Consult Post Acute Care Choice: Home Health, Durable Medical Equipment Living arrangements for the past 2 months: Single Family Home Expected Discharge Date: 04/13/24               DME Arranged: Shower stool (insurance does not cover- pt will need to purchase themselves) DME Agency: NA       HH Arranged: PT HH Agency: [facility] Date [facility] Agency Contacted: 04/12/24 Time HH Agency Contacted: 1547 Representative spoke with at [facility] Agency: Hub/Cory   Social Drivers of Health (SDOH) Interventions SDOH Screenings   Food Insecurity: No Food Insecurity (04/10/2024)  Housing: Low Risk (04/10/2024)  Transportation Needs: No Transportation Needs (04/10/2024)  Utilities: Not At Risk (04/10/2024)  Social Connections: Socially Integrated (04/10/2024)  Tobacco Use: Medium Risk (04/10/2024)    Readmission Risk Interventions     No data to display

## 2024-04-14 NOTE — Progress Notes (Signed)
 " Progress Note    Michael Bryant   FMW:[id number]  DOB: 02/10/1934  DOA: 04/10/2024     4 PCP: Corlis Pagan, NP  Initial CC: AMS, hypotension   Hospital Course: Michael Bryant is an 89 year old male with extensive history of CAD, s/p CABG 2003, VF arrest in 2003, HTN, HLD, ? CHF, chronic iron  deficiency anemia, recent hospitalization for influenza A, hypoxia. Was discharged 03/24/2024. Presented with chief complaint of altered mental status, and hypotension. Per wife at breakfast seemed confused, and less responsive, and was complaining of generalized weaknesses.  Repetitive questioning, and hand movements touching his face and eyes. Patient was subsequently found hypotensive.   Reported tactile fever. No chills, no loss of consciousness.  Patient denies any active pain but is confused and has difficulty providing a complete history.  Wife and daughter present at bedside on admission.   ED Evaluation: POA: Hypotension systolic in 80s, then 108 -subsequent improved with IVF Blood pressure (!) 118/56, pulse 71, temperature 98.2 F (36.8 C), temperature source Oral, resp. rate 17, SpO2 97%. LABs: WBC 8.6, hemoglobin 9.8, potassium 3.1, BUN 35, creatinine 1.79, calcium 8.7, lactic acid 5.4 Influenza A, B, SARS-CoV-2, RSV-all negative  CT head:  1. Low-density subdural collection along the left cerebral convexity measuring up to 9 mm with 2 mm left-to-right midline shift. 2. Complete opacification of the bilateral maxillary sinuses, frontal sinuses, and anterior ethmoid air cells. 3. Generalized atrophy and white matter disease, stable. 4. Remote lacunar infarcts in the cerebellum bilaterally, left greater than right.  Patient met sepsis criteria, started on IV fluids, IV antibiotics, cultures were obtained.  Interval History:  Movements seem to be a little bit better today.  He was not touching his face when seen in the morning but he was a little restless with his upper back bringing it  off the back of the recliner over and over again. However he was more oriented and able to answer name, place, year, president, month.  Tentative plan is now for going to SNF as wife unable to care for him at home until stronger.  Assessment and Plan: * SIRS (systemic inflammatory response syndrome) (HCC)-resolved as of 04/13/2024 - Technically does not meet sepsis criteria. -He was hypotensive with an AKI on admission and elevated lactic acid which may have also been due to hypoperfusion from volume depletion and poor perfusion -Treated with fluids and started on broad-spectrum antibiotics - No obvious source other than opacification of maxillary, frontal, ethmoid sinuses on CT head on admission - Initially started on vancomycin , cefepime , Flagyl .  Okay to de-escalate down to unasyn  and then will do augmentin  at discharge to complete 7 days total   Cognitive impairment - Family is describing slow decline ever since sinusitis diagnosis months ago.  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  Also having unintentional hand movements, not quite consistent with intentional tremor; Parkinson would also be on differential.  Some agitation at times.  Still has inability to answer orientation questions -Some suspicion he may have underlying undiagnosed dementia or some symptoms associated with the underlying subdural.  CT does show generalized atrophy and white matter disease along with lacunar infarcts in the cerebellum.  All of this may contribute to underlying dementia picture as well - will have SLP do cognitive eval too - trial of aricept , namenda , and propranolol  started 2/2; some very mild improvement noted 2/3  SDH (subdural hematoma) (HCC) - CT head showed  chronic SDH measuring 9 mm with 2 mm left-to-right midline shift - Imaging reviewed by neurosurgery with no intervention recommended and outpatient follow-up for repeat imaging 4 to  6 weeks  Chronic sinusitis Acute on chronic sinusitis as noted above   Acute renal failure superimposed on stage 3a chronic kidney disease (HCC) - patient has history of CKD3a. Baseline creat ~ 1.2, eGFR~ 54 - patient presents with increase in creat >0.3 mg/dL above baseline or creat increase >1.5x baseline presumed to have occurred within past 7 days PTA - creat 1.79 on admission - s/p IVF - improved   Weakness - Evaluated by PT/OT.  Recommended for home health PT and shower bench - family concerned about taking him home.  They are thinking may need SNF.  Will continue to follow with therapy assessments  NSVT (nonsustained ventricular tachycardia) (HCC) -Continue amiodarone  -Maintaining K >4  , Magnesium > 2.0  Acute on chronic combined systolic and diastolic CHF (congestive heart failure) (HCC) No signs of exacerbation - 03/22/24 echo:  EF 55-60%, normal LV function, normal RV function, no evidence of valvular regurgitation or deformities, negative for any LVH, diastolic parameters were normal - s/p IVF; no issues  Hypokalemia - replete as needed  Transaminitis - Elevated LFTs: AST 159, ALT 58, -May have been exacerbated by sepsis -Avoid hepatotoxic, holding statins - s/p  IVF  Normocytic anemia - Baseline hemoglobin around 9 to 10 g/dL.  Currently at baseline   Hypercholesterolemia - Holding simvastatin  due to elevated LFTs  Essential hypertension POA hypotensive Blood pressure stable now after IV fluid -Holding home BP meds including valsartan   CAD (coronary artery disease), hx CABG 2003 Denies any chest pain, resuming aspirin , holding statins due to transaminitis for now   Antimicrobials: Vancomycin  04/10/2024 >> 04/11/2024 Cefepime  04/10/2024 >> 04/11/2024 Flagyl  04/10/2024 >> 04/11/2024 Unasyn  04/11/2024 >> 2/1 Augmentin  2/1 >> current   Consultants:    Procedures:    DVT prophylaxis:  SCDs Start: 04/10/24 1212 Place TED hose Start: 04/10/24  1212   Code Status:   Code Status: Full Code  Barriers to discharge: None Therapy evaluation: PT Orders: Active   PT Follow up Rec: Skilled Nursing-Short Term Rehab (<3 Hours/Day)04/13/2024 1300  Disposition Plan: SNF Status is: Inpatient  Mobility Assessment (Last 72 Hours)     Mobility Assessment     Row Name 04/13/24 1946 04/13/24 1300 04/13/24 1000 04/13/24 0723 04/12/24 2009   Does the patient have exclusion criteria? No- Perform mobility assessment -- -- No- Perform mobility assessment No- Perform mobility assessment   What is the highest level of mobility based on the mobility assessment? Level 4 (Ambulates with assistance) - Balance while stepping forward/back - Complete Level 3 (Stands with assistance) - Balance while standing  and cannot march in place Level 3 (Stands with assistance) - Balance while standing  and cannot march in place Level 4 (Ambulates with assistance) - Balance while stepping forward/back - Complete Level 4 (Ambulates with assistance) - Balance while stepping forward/back - Complete    Row Name 04/12/24 2000 04/12/24 0735 04/11/24 2000       Does the patient have exclusion criteria? No- Perform mobility assessment No- Perform mobility assessment No- Perform mobility assessment     What is the highest level of mobility based on the mobility assessment? Level 4 (Ambulates with assistance) - Balance while stepping forward/back - Complete Level 4 (Ambulates with assistance) - Balance while stepping forward/back - Complete Level 4 (Ambulates with assistance) - Balance while stepping forward/back - Complete  Diet: Diet Orders (From admission, onward)     Start     Ordered   04/13/24 0000  Diet general        04/13/24 1059   04/10/24 1212  Diet regular Room service appropriate? Yes; Fluid consistency: Thin  Diet effective now       Question Answer Comment  Room service appropriate? Yes   Fluid consistency: Thin      04/10/24 1213             Objective: Blood pressure (!) 105/57, pulse (!) 58, temperature 98.1 F (36.7 C), temperature source Oral, resp. rate 15, height 5' 10 (1.778 m), weight 81.8 kg, SpO2 97%.  Examination:  Physical Exam Constitutional:      General: He is not in acute distress.    Appearance: Normal appearance.  HENT:     Head: Normocephalic and atraumatic.     Mouth/Throat:     Mouth: Mucous membranes are moist.  Eyes:     Extraocular Movements: Extraocular movements intact.  Cardiovascular:     Rate and Rhythm: Normal rate and regular rhythm.  Pulmonary:     Effort: Pulmonary effort is normal. No respiratory distress.     Breath sounds: Normal breath sounds. No wheezing.  Abdominal:     General: Bowel sounds are normal. There is no distension.     Palpations: Abdomen is soft.     Tenderness: There is no abdominal tenderness.  Musculoskeletal:        General: Normal range of motion.     Cervical back: Normal range of motion and neck supple.  Skin:    General: Skin is warm and dry.  Neurological:     General: No focal deficit present.     Mental Status: He is alert. He is disoriented.     Comments: Improved tremor in his hands and no longer touching face.  Orientation also improved.  Now A&O x 3  Psychiatric:        Mood and Affect: Mood normal.        Behavior: Behavior normal.      Data Reviewed: Results for orders placed or performed during the hospital encounter of 04/10/24 (from the past 24 hours)  Glucose, capillary     Status: Abnormal   Collection Time: 04/14/24  7:45 AM  Result Value Ref Range   Glucose-Capillary 105 (H) 70 - 99 mg/dL    I have reviewed pertinent nursing notes, vitals, labs, and images as necessary. I have ordered labwork to follow up on as indicated.  I have reviewed the last notes from staff over past 24 hours. I have discussed patient's care plan and test results with nursing staff, CM/SW, and other staff as appropriate.  Old records reviewed in  assessment of this patient  Time spent: Greater than 50% of the 55 minute visit was spent in counseling/coordination of care for the patient as laid out in the A&P.   LOS: 4 days   Alm Apo, MD [facility] 04/14/2024, 12:06 PM "

## 2024-04-14 NOTE — Progress Notes (Signed)
 SLP Cancellation Note  Patient Details Name: Michael Bryant MRN: [id number] DOB: 1934-03-10   Cancelled treatment:       Reason Eval/Treat Not Completed: Fatigue/lethargy limiting ability to participate. Daughter asks me to let pt sleep.    Adriena Manfre, Consuelo Fitch 04/14/2024, 1:01 PM

## 2024-04-15 ENCOUNTER — Other Ambulatory Visit (HOSPITAL_COMMUNITY): Payer: Self-pay

## 2024-04-15 LAB — CULTURE, BLOOD (ROUTINE X 2)
Culture: NO GROWTH
Culture: NO GROWTH
Special Requests: ADEQUATE
Special Requests: ADEQUATE

## 2024-04-15 LAB — BASIC METABOLIC PANEL WITH GFR
Anion gap: 7 (ref 5–15)
BUN: 21 mg/dL (ref 8–23)
CO2: 30 mmol/L (ref 22–32)
Calcium: 8.5 mg/dL — ABNORMAL LOW (ref 8.9–10.3)
Chloride: 100 mmol/L (ref 98–111)
Creatinine, Ser: 0.93 mg/dL (ref 0.61–1.24)
GFR, Estimated: >60 mL/min
Glucose, Bld: 121 mg/dL — ABNORMAL HIGH (ref 70–99)
Potassium: 4.2 mmol/L (ref 3.5–5.1)
Sodium: 136 mmol/L (ref 135–145)

## 2024-04-15 LAB — CBC WITH DIFFERENTIAL/PLATELET
Abs Immature Granulocytes: 0.08 10*3/uL — ABNORMAL HIGH (ref 0.00–0.07)
Basophils Absolute: 0.1 10*3/uL (ref 0.0–0.1)
Basophils Relative: 1 %
Eosinophils Absolute: 0.2 10*3/uL (ref 0.0–0.5)
Eosinophils Relative: 3 %
HCT: 30.3 % — ABNORMAL LOW (ref 39.0–52.0)
Hemoglobin: 9.7 g/dL — ABNORMAL LOW (ref 13.0–17.0)
Immature Granulocytes: 1 %
Lymphocytes Relative: 21 %
Lymphs Abs: 1.5 10*3/uL (ref 0.7–4.0)
MCH: 29.2 pg (ref 26.0–34.0)
MCHC: 32 g/dL (ref 30.0–36.0)
MCV: 91.3 fL (ref 80.0–100.0)
Monocytes Absolute: 0.8 10*3/uL (ref 0.1–1.0)
Monocytes Relative: 11 %
Neutro Abs: 4.5 10*3/uL (ref 1.7–7.7)
Neutrophils Relative %: 63 %
Platelets: 245 10*3/uL (ref 150–400)
RBC: 3.32 MIL/uL — ABNORMAL LOW (ref 4.22–5.81)
RDW: 18.2 % — ABNORMAL HIGH (ref 11.5–15.5)
WBC: 7.1 10*3/uL (ref 4.0–10.5)
nRBC: 0 % (ref 0.0–0.2)

## 2024-04-15 LAB — MAGNESIUM: Magnesium: 1.8 mg/dL (ref 1.7–2.4)

## 2024-04-15 LAB — GLUCOSE, CAPILLARY: Glucose-Capillary: 164 mg/dL — ABNORMAL HIGH (ref 70–99)

## 2024-04-15 MED ORDER — VALSARTAN 160 MG PO TABS: 160.0000 mg | ORAL_TABLET | Freq: Every day | ORAL | 0 refills | Status: AC

## 2024-04-15 NOTE — Plan of Care (Signed)

## 2024-04-15 NOTE — Progress Notes (Signed)
 Physical Therapy Treatment Patient Details Name: Michael Bryant MRN: [id number] DOB: 04/03/1933 Today's Date: 04/15/2024   History of Present Illness 89 year old male presented 04/10/24 with  with chief complaint of altered mental status, and hypotension. +sepsis; head CT incidental finding L SDH - no surgical indication per neurosurgery, remote bil cerebellar infarcts; PMH- CAD, s/p CABG 2003, VF arrest in 2003, HTN, HLD, ? CHF, chronic iron  deficiency anemia, recent hospitalization for influenza A, hypoxia.  Was discharged 03/24/2024    PT Comments  Pt much improved cognitively and functionally compared to [facility] session. Pt not restless or agitated this date but remains mildly confused with impaired processing and sequencing. Pt much more stable with RW this date and appeared to have improved vision. Acute PT to cont to follow.     If plan is discharge home, recommend the following: Assist for transportation;Help with stairs or ramp for entrance;Direct supervision/assist for financial management;Direct supervision/assist for medications management;A little help with walking and/or transfers;A little help with bathing/dressing/bathroom   Can travel by private vehicle        Equipment Recommendations  Rolling walker (2 wheels)    Recommendations for Other Services       Precautions / Restrictions Precautions Precautions: Fall Recall of Precautions/Restrictions: Impaired Restrictions Weight Bearing Restrictions Per Provider Order: No     Mobility  Bed Mobility Overal bed mobility: Needs Assistance Bed Mobility: Supine to Sit     Supine to sit: Contact guard     General bed mobility comments: increased time, tactile cues to come out the L side    Transfers Overall transfer level: Needs assistance Equipment used: Rolling walker (2 wheels) Transfers: Sit to/from Stand Sit to Stand: Min assist           General transfer comment: minA to power up from lower surface height,  verbal cues to push up from bed not pull up on RW    Ambulation/Gait Ambulation/Gait assistance: Min assist Gait Distance (Feet): 120 Feet Assistive device: Rolling walker (2 wheels) Gait Pattern/deviations: Step-through pattern, Trunk flexed Gait velocity: dec Gait velocity interpretation: 1.31 - 2.62 ft/sec, indicative of limited community ambulator   General Gait Details: pt much improved from last session. Pt able to navigate hallways, follow multistep directional way finding directions back to room, demo'd good walker management and safe speed.   Stairs             Wheelchair Mobility     Tilt Bed    Modified Rankin (Stroke Patients Only)       Balance Overall balance assessment: Needs assistance Sitting-balance support: Feet supported Sitting balance-Leahy Scale: Good Sitting balance - Comments: pt more steady this date   Standing balance support: During functional activity Standing balance-Leahy Scale: Fair Standing balance comment: benefits from RW for amb                            Communication Communication Communication: No apparent difficulties  Cognition Arousal: Lethargic Behavior During Therapy: Restless, Impulsive                           PT - Cognition Comments: pt awoken from resting but cooperative and willing to ambulate. Pt able to state he is in the hospital but not sure why. pt not restless or fidigity Following commands: Impaired Following commands impaired: Follows multi-step commands inconsistently, Follows one step commands with increased time    Cueing  Cueing Techniques: Verbal cues, Gestural cues, Tactile cues, Visual cues  Exercises      General Comments General comments (skin integrity, edema, etc.): VSS      Pertinent Vitals/Pain Pain Assessment Pain Assessment: Faces Faces Pain Scale: No hurt    Home Living                          Prior Function            PT Goals (current  goals can now be found in the care plan section) Acute Rehab PT Goals Patient Stated Goal: didn't state PT Goal Formulation: With patient Time For Goal Achievement: 04/25/24 Potential to Achieve Goals: Good Progress towards PT goals: Progressing toward goals    Frequency    Min 2X/week      PT Plan      Co-evaluation              AM-PAC PT 6 Clicks Mobility   Outcome Measure  Help needed turning from your back to your side while in a flat bed without using bedrails?: A Little Help needed moving from lying on your back to sitting on the side of a flat bed without using bedrails?: A Little Help needed moving to and from a bed to a chair (including a wheelchair)?: A Lot Help needed standing up from a chair using your arms (e.g., wheelchair or bedside chair)?: A Lot Help needed to walk in hospital room?: A Lot Help needed climbing 3-5 steps with a railing? : A Lot 6 Click Score: 14    End of Session Equipment Utilized During Treatment: Gait belt Activity Tolerance: Patient tolerated treatment well Patient left: with call bell/phone within reach;in chair;with chair alarm set (set up for lunch) Nurse Communication: Mobility status PT Visit Diagnosis: Muscle weakness (generalized) (M62.81);Unsteadiness on feet (R26.81)     Time: 8764-8751 PT Time Calculation (min) (ACUTE ONLY): 13 min  Charges:    $Gait Training: 8-22 mins PT General Charges $$ ACUTE PT VISIT: 1 Visit                     Norene Ames, PT, DPT Acute Rehabilitation Services Secure chat preferred Office #: [phone]    Norene CHRISTELLA Ames 04/15/2024, 2:15 PM

## 2024-04-15 NOTE — Progress Notes (Signed)
 Pt d/c with PTAR to [facility]. Report given to [place]. D/C instructions and pt belongings sent with PTAR.

## 2024-04-15 NOTE — Assessment & Plan Note (Signed)
 Continue with nutritional supplements,

## 2024-04-15 NOTE — TOC Transition Note (Signed)
 Transition of Care [facility]) - Discharge Note   Patient Details  Name: Michael Bryant MRN: [id number] Date of Birth: 03-02-1934  Transition of Care [facility]) CM/SW Contact:  [name], LCSW Phone Number: 04/15/2024, 2:28 PM   Clinical Narrative:     Patient will Discharge to: [facility]  Discharge Date: 04/15/24 Family Notified: daughter Transport Ab:[facility]  Per MD patient is ready for discharge. RN, patient, and facility notified of discharge. Discharge Summary sent to facility. RN given number for [phone], Room 127-B. Ambulance transport requested for patient.   Clinical Social Worker signing off.  Montie Louder, MSW, LCSW Clinical Social Worker     Final next level of care: Skilled Nursing Facility Barriers to Discharge: Barriers Resolved   Patient Goals and CMS Choice Patient states their goals for this hospitalization and ongoing recovery are:: return home   Choice offered to / list presented to : Patient      Discharge Placement              Patient chooses bed at:  [facility]) Patient to be transferred to facility by: PTAR Name of family member notified: daughter Patient and family notified of of transfer: 04/15/24  Discharge Plan and Services Additional resources added to the After Visit Summary for     Discharge Planning Services: CM Consult Post Acute Care Choice: Home Health, Durable Medical Equipment          DME Arranged: Shower stool (insurance does not cover- pt will need to purchase themselves) DME Agency: NA       HH Arranged: PT HH Agency: [facility] Date [facility] Agency Contacted: 04/12/24 Time HH Agency Contacted: [phone] Representative spoke with at [facility] Agency: Hub[facility]  Social Drivers of Health (SDOH) Interventions SDOH Screenings   Food Insecurity: No Food Insecurity (04/10/2024)  Housing: Low Risk (04/10/2024)  Transportation Needs: No Transportation Needs (04/10/2024)  Utilities: Not At Risk (04/10/2024)   Social Connections: Socially Integrated (04/10/2024)  Tobacco Use: Medium Risk (04/10/2024)     Readmission Risk Interventions     No data to display

## 2024-04-15 NOTE — Discharge Summary (Addendum)
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 visualized skeletal structures are unremarkable. IMPRESSION: No active cardiopulmonary disease. Electronically Signed   By: Norleen DELENA Kil M.D.   On: 03/21/2024 14:17    Microbiology: Results for orders placed or performed during the hospital encounter of 04/10/24  Resp panel by RT-PCR (RSV, Flu A&B, Covid) Anterior Nasal Swab     Status: None   Collection Time: 04/10/24  9:50 AM   Specimen: Anterior Nasal Swab  Result Value Ref Range Status   SARS Coronavirus 2 by RT PCR NEGATIVE NEGATIVE Final   Influenza A by PCR NEGATIVE NEGATIVE Final   Influenza B by PCR NEGATIVE NEGATIVE Final    Comment: (NOTE) The Xpert Xpress SARS-CoV-2/FLU/RSV plus assay is intended as an aid in the diagnosis of influenza from Nasopharyngeal swab specimens and should not be used as a sole basis for treatment. Nasal washings and aspirates are unacceptable for Xpert Xpress  SARS-CoV-2/FLU/RSV testing.  Fact Sheet for Patients: bloggercourse.com  Fact Sheet for Healthcare Providers: seriousbroker.it  This test is not yet approved or cleared by the [place]  FDA and has been authorized for detection and/or diagnosis of SARS-CoV-2 by FDA under an Emergency Use Authorization (EUA). This EUA will remain in effect (meaning this test can be used) for the duration of the COVID-19 declaration under Section 564(b)(1) of the Act, 21 U.S.C. section 360bbb-3(b)(1), unless the authorization is terminated or revoked.     Resp Syncytial Virus by PCR NEGATIVE NEGATIVE Final    Comment: (NOTE) Fact Sheet for Patients: bloggercourse.com  Fact Sheet for Healthcare Providers: seriousbroker.it  This test is not yet approved or cleared by the [place]  FDA and has been authorized for detection and/or diagnosis of SARS-CoV-2 by FDA under an Emergency Use Authorization (EUA). This EUA will remain in effect (meaning this test can be used) for the duration of the COVID-19 declaration under Section 564(b)(1) of the Act, 21 U.S.C. section 360bbb-3(b)(1), unless the authorization is terminated or revoked.  Performed at [facility], [address] [place], [name] [address]   Blood Culture (routine x 2)     Status: None   Collection Time: 04/10/24 10:07 AM   Specimen: BLOOD  Result Value Ref Range Status   Specimen Description BLOOD BLOOD LEFT ARM  Final   Special Requests   Final    BOTTLES DRAWN AEROBIC AND ANAEROBIC Blood Culture adequate volume   Culture   Final    NO GROWTH 5 DAYS Performed at [facility], [address] [place], [name] [address]    Report Status 04/15/2024 FINAL  Final  Blood Culture (routine x 2)     Status: None   Collection Time: 04/10/24 10:11 AM   Specimen: BLOOD  Result Value Ref Range Status   Specimen Description  BLOOD BLOOD RIGHT HAND  Final   Special Requests AEROBIC BOTTLE ONLY Blood Culture adequate volume  Final   Culture   Final    NO GROWTH 5 DAYS Performed at [facility], [address], [place], [name] [address]    Report Status 04/15/2024 FINAL  Final    Labs: CBC: Recent Labs  Lab 04/10/24 1007 04/11/24 0311 04/12/24 0319 04/13/24 0318 04/15/24 0620  WBC 8.6 6.8 5.6 6.6 7.1  NEUTROABS 7.0  --   --  4.4 4.5  HGB 9.8* 9.0* 8.7* 9.6* 9.7*  HCT 30.8* 28.0* 27.2* 29.7* 30.3*  MCV 92.2 92.1 92.2 92.0 91.3  PLT 327 264 247 242 245   Basic Metabolic Panel: Recent Labs  Lab 04/10/24 1007 04/11/24 0311 04/12/24 0319 04/13/24 0318 04/15/24 0620  NA 137 135 138 138 136  K 3.1* 3.7 4.0 4.2 4.2  CL 98 101 104 101 100  CO2 23 25 26 28 30   GLUCOSE 122* 89 99 122* 121*  BUN 35* 25* 21 18 21   CREATININE 1.79* 1.16 0.97 1.07 0.93  CALCIUM 8.7* 8.3* 8.2* 8.6* 8.5*  MG 2.0  --  2.0 1.8 1.8  PHOS 3.1  --   --   --   --    Liver Function Tests: Recent Labs  Lab 04/10/24 1007 04/11/24 0311 04/12/24 0319 04/13/24 0318  AST 195* 121* 86* 58*  ALT 58* 45* 41 37  ALKPHOS 86 72 69 72  BILITOT 0.5 0.5 0.4 0.4  PROT 6.4* 5.4* 5.4* 5.8*  ALBUMIN 3.3* 2.8* 2.8* 2.9*   CBG: Recent Labs  Lab 04/11/24 0802 04/12/24 0808 04/13/24 0753 04/14/24 0745 04/15/24 0817  GLUCAP 108* 93 120* 105* 164*    Discharge time spent: greater than 30 minutes.  Signed: Elidia Toribio Furnace, MD [facility] 04/15/2024 "

## 2024-04-16 ENCOUNTER — Other Ambulatory Visit: Payer: Self-pay

## 2024-04-16 ENCOUNTER — Emergency Department (HOSPITAL_COMMUNITY)
Admission: EM | Admit: 2024-04-16 | Discharge: 2024-04-16 | Disposition: A | Discharge status: Skilled Nursing Facility | Attending: Emergency Medicine | Admitting: Emergency Medicine

## 2024-04-16 ENCOUNTER — Emergency Department (HOSPITAL_COMMUNITY)

## 2024-04-16 DIAGNOSIS — R410 Disorientation, unspecified: Secondary | ICD-10-CM | POA: Insufficient documentation

## 2024-04-16 LAB — CBC
HCT: 31.7 % — ABNORMAL LOW (ref 39.0–52.0)
Hemoglobin: 10.1 g/dL — ABNORMAL LOW (ref 13.0–17.0)
MCH: 29.6 pg (ref 26.0–34.0)
MCHC: 31.9 g/dL (ref 30.0–36.0)
MCV: 93 fL (ref 80.0–100.0)
Platelets: 250 10*3/uL (ref 150–400)
RBC: 3.41 MIL/uL — ABNORMAL LOW (ref 4.22–5.81)
RDW: 18.6 % — ABNORMAL HIGH (ref 11.5–15.5)
WBC: 7.5 10*3/uL (ref 4.0–10.5)
nRBC: 0 % (ref 0.0–0.2)

## 2024-04-16 LAB — COMPREHENSIVE METABOLIC PANEL WITH GFR
ALT: 29 U/L (ref 0–44)
AST: 30 U/L (ref 15–41)
Albumin: 3.1 g/dL — ABNORMAL LOW (ref 3.5–5.0)
Alkaline Phosphatase: 70 U/L (ref 38–126)
Anion gap: 8 (ref 5–15)
BUN: 18 mg/dL (ref 8–23)
CO2: 30 mmol/L (ref 22–32)
Calcium: 9 mg/dL (ref 8.9–10.3)
Chloride: 100 mmol/L (ref 98–111)
Creatinine, Ser: 0.96 mg/dL (ref 0.61–1.24)
GFR, Estimated: >60 mL/min
Glucose, Bld: 128 mg/dL — ABNORMAL HIGH (ref 70–99)
Potassium: 4.1 mmol/L (ref 3.5–5.1)
Sodium: 137 mmol/L (ref 135–145)
Total Bilirubin: 0.4 mg/dL (ref 0.0–1.2)
Total Protein: 5.9 g/dL — ABNORMAL LOW (ref 6.5–8.1)

## 2024-04-16 NOTE — ED Triage Notes (Signed)
 Pt BIB [facility] from [facility] after being discharged from hospital yesterday for subdural hematoma. Facility reported to EMS that pt was agitated and confused when he returned last night so they gave him a seroquel. Pt is oriented x 4 during triage, but does appear to be quite somnolent. Pt denies any symptoms. EMS said that facility reported that pt may have fallen this morning, but pt denies this.

## 2024-04-16 NOTE — ED Notes (Signed)
 Called  [facility] FOR PICK UP ETA 45 MINUTE

## 2024-04-16 NOTE — Discharge Instructions (Signed)
 Your evaluation today has been reassuring.  However, with your medical issues and your prior intracranial hemorrhage it is important monitor your condition carefully and follow-up with your physician.  Return here for concerning changes in your condition.

## 2024-04-16 NOTE — ED Provider Notes (Signed)
 11:39 AM On discharge patient's daughter present.  We discussed today's findings, yesterday's discharge plan, including adjustment of medications, with close outpatient follow-up.   Garrick Charleston, MD 04/16/24 1139

## 2024-04-16 NOTE — ED Notes (Signed)
 Dr. Garrick bedside to speak with patients daughter.

## 2024-04-16 NOTE — ED Provider Notes (Signed)
 " [facility] Provider Note   CSN: [id number] Arrival date & time: 04/16/24  9372     Patient presents with: Altered Mental Status   Michael Bryant is a 89 y.o. male.   HPI Pt BIB [facility] from [facility] after being discharged from hospital yesterday for subdural hematoma. Facility reported to EMS that pt was agitated and confused when he returned last night so they gave him a seroquel. Pt is oriented x 4 during triage, but does appear to be quite somnolent. Pt denies any symptoms. EMS said that facility reported that pt may have fallen this morning, but pt denies this.     On my exam the patient denies pain, denies focal weakness, is unsure why he was sent here for evaluation.    Prior to Admission medications  Medication Sig Start Date End Date Taking? Authorizing Provider  amiodarone  (PACERONE ) 200 MG tablet Take 0.5 tablets (100 mg total) by mouth daily. 06/03/23   Croitoru, Mihai, MD  azelastine  (ASTELIN ) 0.1 % nasal spray Place 2 sprays into both nostrils 2 (two) times daily. Use in each nostril as directed 12/25/23   Hedges, Reyes, PA-C  beta carotene w/minerals (OCUVITE) tablet Take 1 tablet by mouth daily.    [provider]  BISACODYL 5 MG EC tablet Take 5 mg by mouth daily as needed for moderate constipation. 03/11/24   [provider]  Cholecalciferol  (VITAMIN D ) 50 MCG (2000 UT) CAPS Take 2,000 Units by mouth daily.    [provider]  CINNAMON PO Take 1 tablet by mouth daily.     [provider]  fish oil-omega-3 fatty acids 1000 MG capsule Take 1 g by mouth daily.     [provider]  FLUoxetine  (PROZAC ) 10 MG capsule Take 10 mg by mouth at bedtime. 03/11/24   [provider]  GELATIN PO Take 9 g by mouth daily.    [provider]  Glucosamine 500 MG CAPS Take 1 capsule by mouth daily.    [provider]  loratadine  (CLARITIN ) 10 MG tablet Take 1 tablet  (10 mg total) by mouth daily. 03/24/24   Gonfa, Taye T, MD  Misc Natural Products (PROSTATE HEALTH) CAPS Take 1 capsule by mouth daily.    [provider]  mometasone  (NASONEX ) 50 MCG/ACT nasal spray Place 2 sprays into the nose daily. Patient taking differently: Place 2 sprays into the nose as needed. 11/15/23   Hedges, Reyes, PA-C  nitroGLYCERIN  (NITROSTAT ) 0.4 MG SL tablet Place 1 tablet (0.4 mg total) under the tongue every 5 (five) minutes as needed for chest pain. 02/27/21   Croitoru, Mihai, MD  omeprazole (PRILOSEC) 10 MG capsule Take 10 mg by mouth daily.    [provider]  Polyethyl Glycol-Propyl Glycol (SYSTANE OP) Place 1 drop into both eyes daily as needed (dry eyes).    [provider]  sodium chloride  (OCEAN) 0.65 % SOLN nasal spray Place 2 sprays into both nostrils every 8 (eight) hours. 03/24/24   Gonfa, Taye T, MD  tamsulosin  (FLOMAX ) 0.4 MG CAPS capsule Take 0.4 mg by mouth daily. 03/02/13   [provider]  valsartan  (DIOVAN ) 160 MG tablet Take 1 tablet (160 mg total) by mouth daily. 04/15/24   Arrien, Mauricio Daniel, MD    Allergies: Codeine    Review of Systems  Updated Vital Signs BP 134/75 (BP Location: Right Arm)   Pulse 71   Temp 97.6 F (36.4 C) (Oral)  Resp 17   SpO2 96%   Physical Exam Vitals and nursing note reviewed.  Constitutional:      General: He is not in acute distress.    Appearance: He is well-developed.     Comments: Deconditioned elderly male no distress  HENT:     Head: Normocephalic and atraumatic.  Eyes:     Conjunctiva/sclera: Conjunctivae normal.  Cardiovascular:     Rate and Rhythm: Normal rate and regular rhythm.  Pulmonary:     Effort: Pulmonary effort is normal. No respiratory distress.     Breath sounds: No stridor.  Abdominal:     General: There is no distension.  Skin:    General: Skin is warm and dry.  Neurological:     Mental Status: He is alert and oriented to person, place, and time.      Comments: Substantial atrophy, though no obvious asymmetry, face is symmetric, speech is slow but clear.     (all labs ordered are listed, but only abnormal results are displayed) Labs Reviewed  COMPREHENSIVE METABOLIC PANEL WITH GFR - Abnormal; Notable for the following components:      Result Value   Glucose, Bld 128 (*)    Total Protein 5.9 (*)    Albumin 3.1 (*)    All other components within normal limits  CBC - Abnormal; Notable for the following components:   RBC 3.41 (*)    Hemoglobin 10.1 (*)    HCT 31.7 (*)    RDW 18.6 (*)    All other components within normal limits  URINALYSIS, ROUTINE W REFLEX MICROSCOPIC    EKG: None  Radiology: CT Head Wo Contrast Result Date: 04/16/2024 EXAM: CT HEAD WITHOUT CONTRAST 04/16/2024 10:50:00 AM TECHNIQUE: Intermittent mild to moderate motion artifact. CT of the head was performed without the administration of intravenous contrast. Automated exposure control, iterative reconstruction, and/or weight based adjustment of the mA/kV was utilized to reduce the radiation dose to as low as reasonably achievable. COMPARISON: Head CT 04/10/2024. CLINICAL HISTORY: 89 year old male. Mental status change, unknown cause. Very agitated, recently hospitalized for subdural hematoma. FINDINGS: BRAIN AND VENTRICLES: Low density left side subdural hematoma is less apparent due to motion artifact now (coronal image 33) but likely has also decreased in size since last month, residual probably 6 to 7 millimeters now (versus 9 millimeters previously). As before only mild mass effect on the left hemisphere. No significant midline shift. No new intracranial hemorrhage. Stable gray white differentiation. Small chronic cerebellar infarcts. No suspicious intracranial vascular hyperdensity. No evidence of acute infarct. No hydrocephalus. ORBITS: No acute abnormality. SINUSES: Ongoing widespread bilateral paranasal sinus opacification, which appears at least in part now  related to layering sinus fluid. Tympanic cavities and mastoids are well aerated. SOFT TISSUES AND SKULL: No acute soft tissue abnormality. No skull fracture. IMPRESSION: 1. Exam degraded by motion. No progression of the left side subdural hematoma since last month. No new intracranial abnormality identified. 2. Ongoing bilateral paranasal sinus opacification with layering fluid, suggesting a component of acute sinusitis. Electronically signed by: Helayne Hurst MD 04/16/2024 11:00 AM EST RP Workstation: [id number]     Procedures   Medications Ordered in the ED - No data to display                                  Medical Decision Making Elderly male recent hospitalization for subdural hematoma now presents from his nursing facility with staff  concern for fatigue.  Patient himself is oriented, awake, alert, neurologically grossly unremarkable, concern for progression of bleed versus polypharmacy versus fatigue secondary to hospitalization/deconditioning.  Patient's initial physical exam is reassuring, initial vitals reassuring, labs CT ordered.  Amount and/or Complexity of Data Reviewed Independent Historian: EMS External Data Reviewed: notes. Labs: ordered. Decision-making details documented in ED Course. Radiology: ordered and independent interpretation performed. Decision-making details documented in ED Course.  Risk Prescription drug management. Decision regarding hospitalization. Diagnosis or treatment significantly limited by social determinants of health.   Patient remains hemodynamically unremarkable, head CT without acute new findings, labs generally reassuring, patient has been here for hours with no decompensation, no hemodynamic instability, appropriate for outpatient follow-up.     Final diagnoses:  Confusion    ED Discharge Orders     None          Garrick Charleston, MD 04/16/24 1103  "
